# Patient Record
Sex: Female | Born: 1950 | Race: White | Hispanic: No | Smoking: Never smoker
Health system: Southern US, Community
[De-identification: ages and names within clinical notes are randomized; demographics above are authoritative.]

## PROBLEM LIST (undated history)

## (undated) DIAGNOSIS — I10 Essential (primary) hypertension: Secondary | ICD-10-CM

## (undated) DIAGNOSIS — E119 Type 2 diabetes mellitus without complications: Secondary | ICD-10-CM

## (undated) DIAGNOSIS — D219 Benign neoplasm of connective and other soft tissue, unspecified: Secondary | ICD-10-CM

## (undated) DIAGNOSIS — M1990 Unspecified osteoarthritis, unspecified site: Secondary | ICD-10-CM

## (undated) DIAGNOSIS — E785 Hyperlipidemia, unspecified: Secondary | ICD-10-CM

## (undated) DIAGNOSIS — M722 Plantar fascial fibromatosis: Secondary | ICD-10-CM

## (undated) DIAGNOSIS — H409 Unspecified glaucoma: Secondary | ICD-10-CM

## (undated) HISTORY — DX: Hyperlipidemia, unspecified: E78.5

## (undated) HISTORY — DX: Unspecified glaucoma: H40.9

## (undated) HISTORY — DX: Type 2 diabetes mellitus without complications: E11.9

## (undated) HISTORY — DX: Plantar fascial fibromatosis: M72.2

## (undated) HISTORY — DX: Unspecified osteoarthritis, unspecified site: M19.90

## (undated) HISTORY — DX: Essential (primary) hypertension: I10

## (undated) HISTORY — DX: Benign neoplasm of connective and other soft tissue, unspecified: D21.9

---
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
## 1983-12-29 HISTORY — PX: CHOLECYSTECTOMY OPEN: SUR202

## 1999-09-29 ENCOUNTER — Other Ambulatory Visit: Admission: RE | Admit: 1999-09-29 | Discharge: 1999-09-29 | Payer: Self-pay | Admitting: Gynecology

## 2001-04-18 ENCOUNTER — Encounter: Admission: RE | Admit: 2001-04-18 | Discharge: 2001-07-17 | Payer: Self-pay | Admitting: Family Medicine

## 2002-04-27 ENCOUNTER — Other Ambulatory Visit: Admission: RE | Admit: 2002-04-27 | Discharge: 2002-04-27 | Payer: Self-pay | Admitting: Gynecology

## 2003-04-18 ENCOUNTER — Other Ambulatory Visit: Admission: RE | Admit: 2003-04-18 | Discharge: 2003-04-18 | Payer: Self-pay | Admitting: Obstetrics and Gynecology

## 2004-12-28 LAB — HM COLONOSCOPY

## 2005-05-28 ENCOUNTER — Other Ambulatory Visit: Admission: RE | Admit: 2005-05-28 | Discharge: 2005-05-28 | Payer: Self-pay | Admitting: Obstetrics and Gynecology

## 2005-08-19 ENCOUNTER — Encounter (INDEPENDENT_AMBULATORY_CARE_PROVIDER_SITE_OTHER): Payer: Self-pay | Admitting: Specialist

## 2005-08-19 ENCOUNTER — Ambulatory Visit (HOSPITAL_COMMUNITY): Admission: RE | Admit: 2005-08-19 | Discharge: 2005-08-19 | Payer: Self-pay | Admitting: Gastroenterology

## 2007-12-29 DIAGNOSIS — M722 Plantar fascial fibromatosis: Secondary | ICD-10-CM

## 2007-12-29 HISTORY — DX: Plantar fascial fibromatosis: M72.2

## 2008-08-28 ENCOUNTER — Other Ambulatory Visit: Admission: RE | Admit: 2008-08-28 | Discharge: 2008-08-28 | Payer: Self-pay | Admitting: Obstetrics and Gynecology

## 2010-09-05 LAB — HM PAP SMEAR

## 2011-02-05 ENCOUNTER — Other Ambulatory Visit: Payer: Self-pay | Admitting: Ophthalmology

## 2011-05-15 NOTE — Op Note (Signed)
NAMETEEA, DUCEY               ACCOUNT NO.:  [id number]   MEDICAL RECORD NO.:  [id number]          PATIENT TYPE:  AMB   LOCATION:  ENDO                         FACILITY:  [facility]   PHYSICIAN:  Anselmo Rod, M.D.  DATE OF BIRTH:  02/13/1951   DATE OF PROCEDURE:  08/19/2005  DATE OF DISCHARGE:                                 OPERATIVE REPORT   PROCEDURE:  Esophagogastroduodenoscopy with biopsies.   ENDOSCOPIST:  Anselmo Rod, M.D.   INSTRUMENT USED:  Olympus video panendoscope.   INDICATIONS FOR PROCEDURE:  This 60 year old white female with a history of  epigastric pain, reflux and a family history of stomach cancer in her  mother, need to rule out peptic ulcer disease, esophagitis, gastritis, etc.   PRE-PROCEDURE PREPARATION:  An informed consent was procured from the  patient and the patient was fasted for eight hours prior to the procedure.   PRE-PROCEDURE PHYSICAL EXAMINATION:  VITAL SIGNS:  Stable.  NECK:  Supple.  CHEST:  Clear to auscultation.  HEART:  S1, S2, regular.  ABDOMEN:  Soft, normal bowel sounds.   DESCRIPTION OF PROCEDURE:  The patient was placed in the left lateral  decubitus position and sedated with 60 mg of Demerol and 8 mg of Versed in  slow incremental doses.  Once the patient was adequately sedated and  maintained on low-flow oxygen and continuous cardiac monitoring, the Olympus  video panendoscope was advanced through the mouth piece, over the tongue,  into the esophagus under direct vision.  The entire esophagus appeared  normal with no evidence of rings, strictures, masses, esophagitis or  Barrett's mucosa.  The scope was then advanced to the stomach and antral  gastritis was noted.  Biopsies were done to rule out the presence of H.  pylori by pathology.  The proximal small bowel appeared normal.   IMPRESSION:  Moderate antral gastritis, otherwise a normal  esophagogastroduodenoscopy.  No erosions, ulcerations, masses or polyps  seen.   RECOMMENDATIONS:  1.  Await pathology results.  2.  Treat with antibiotics if Helicobacter pylori present on biopsies.  3.  Proceed with a colonoscopy at this time.  Further recommendations will      be made thereafter.      Anselmo Rod, M.D.  Electronically Signed     JNM/MEDQ  D:  08/19/2005  T:  08/19/2005  Job:  [id number]   cc:   Ria Comment, F.N.P.   Cynthia P. Romine, M.D.  [address]  [place]  [place] [address]  Fax: [phone]   Duncan Dull, M.D.  [address]  [address]  [place]  [place] [address]  Fax: [phone]

## 2011-05-15 NOTE — Op Note (Signed)
NAMESAMIYA, Crystal Fox               ACCOUNT NO.:  [id number]   MEDICAL RECORD NO.:  [id number]          PATIENT TYPE:  AMB   LOCATION:  ENDO                         FACILITY:  [facility]   PHYSICIAN:  Anselmo Rod, M.D.  DATE OF BIRTH:  [birth year]   DATE OF PROCEDURE:  08/19/2005  DATE OF DISCHARGE:                                 OPERATIVE REPORT   PROCEDURE PERFORMED:  Colonoscopy with cold biopsies x8.   ENDOSCOPIST:  Anselmo Rod, M.D.   INSTRUMENT USED:  Olympus video colonoscope.   INDICATION FOR PROCEDURE:  A 60 year old white female undergoing screening  colonoscopy to rule out colonic polyps, masses, etc.   PREPROCEDURE PREPARATION:  Informed consent was procured from the patient.  The patient was fasted for eight hours prior to the procedure and prepped  with a bottle of magnesium citrate and a gallon of GoLYTELY the night prior  to the procedure.  The risks and benefits of the procedure, including a 10%  miss rate of cancer and polyps, were discussed with the patient as well.   PREPROCEDURE PHYSICAL:  VITAL SIGNS:  The patient had stable vital signs.  NECK:  Supple.  CHEST:  Clear to auscultation.  S1, S2 regular.  ABDOMEN:  Soft with normal bowel sounds.   DESCRIPTION OF PROCEDURE:  The patient was placed in the left lateral  decubitus position and sedated with an additional 40 mg of Demerol and 2 mg  of Versed in slow incremental doses.  Once the patient was adequately sedate  and maintained on low-flow oxygen and continuous cardiac monitoring, the  Olympus video colonoscope was advanced from the rectum to the cecum.  The  appendiceal orifice and the ileocecal valve were visualized and  photographed.  There was significant amount of residual stool in the right  colon.  Multiple washes were done.  A small patch of erythema was biopsied  from the proximal right colon.  The significance of this is unclear to me.  The rest of the exam was unremarkable.  Retroflexion in  the rectum revealed  no abnormalities.   IMPRESSION:  1.  Small patch of erythema biopsied from proximal right colon, results      pending.  2.  Otherwise normal exam.   RECOMMENDATIONS:  1.  Await pathology results.  2.  Repeat colonoscopy depending on pathology results.  3.  Have the patient follow up as the need arises in the future.  4.  Avoid all nonsteroidals for the next two weeks.      Anselmo Rod, M.D.  Electronically Signed     JNM/MEDQ  D:  08/19/2005  T:  08/19/2005  Job:  [id number]   cc:   Ria Comment, F.N.P.   Cynthia P. Romine, M.D.  [address]  [place]  [place] [address]  Fax: [phone]   Duncan Dull, M.D.  [address]  [address]  [place]  [place] [address]  Fax: [phone]

## 2013-06-14 LAB — HM MAMMOGRAPHY

## 2013-10-25 ENCOUNTER — Encounter: Payer: Self-pay | Admitting: Obstetrics and Gynecology

## 2013-10-25 ENCOUNTER — Ambulatory Visit: Payer: Self-pay | Admitting: Obstetrics and Gynecology

## 2013-10-25 ENCOUNTER — Ambulatory Visit (INDEPENDENT_AMBULATORY_CARE_PROVIDER_SITE_OTHER): Payer: 59 | Admitting: Obstetrics and Gynecology

## 2013-10-25 VITALS — BP 140/80 | HR 84 | Ht 64.0 in | Wt 220.5 lb

## 2013-10-25 DIAGNOSIS — Z0000 Encounter for general adult medical examination without abnormal findings: Secondary | ICD-10-CM

## 2013-10-25 DIAGNOSIS — Z01419 Encounter for gynecological examination (general) (routine) without abnormal findings: Secondary | ICD-10-CM

## 2013-10-25 LAB — POCT URINALYSIS DIPSTICK
Bilirubin, UA: NEGATIVE
Blood, UA: NEGATIVE
Glucose, UA: NEGATIVE
Ketones, UA: NEGATIVE
Leukocytes, UA: NEGATIVE
Nitrite, UA: NEGATIVE
Protein, UA: NEGATIVE
Urobilinogen, UA: NEGATIVE
pH, UA: 5

## 2013-10-25 MED ORDER — ESTRADIOL 10 MCG VA TABS: 1.0000 | ORAL_TABLET | VAGINAL | Status: DC

## 2013-10-25 NOTE — Addendum Note (Signed)
Addended by: Conley Simmonds on: 10/25/2013 12:18 PM   Modules accepted: Orders

## 2013-10-25 NOTE — Progress Notes (Signed)
Patient ID: Crystal Fox, female   DOB: 1951/11/29, 62 y.o.   MRN: [id number] GYNECOLOGY VISIT  PCP:  Geoffry Paradise, MD  Referring provider:   HPI: 62 y.o.   Married  Caucasian  female   G1P1001 with Patient's last menstrual period was 12/28/2004.   here for   AEX. Likes the Vagifem. Has a known cystocele.  Patient has not noticed any change.  No urinary incontinence.  Had surgery for prolapse in 1978 - Dr. Shea Evans.  Voids often and with urge.  Taking a new diabetes medication, which causes this.  Hgb A1C 7.9. Hoping to retire in three years.   Hgb:    PCP Urine:  Neg  GYNECOLOGIC HISTORY: Patient's last menstrual period was 12/28/2004. Sexually active:  yes Partner preference: female Contraception:  postmenopausal  Menopausal hormone therapy: Vagifem DES exposure:   no Blood transfusions:   no Sexually transmitted diseases:   no GYN Procedures:  Benign Left breast biopsy 1973 Mammogram:   [id number]  - mobile unit through work at [employer]. Pap:   09-05-10 wnl History of abnormal pap smear:  no   OB History   Grav Para Term Preterm Abortions TAB SAB Ect Mult Living   1 1 1       1        LIFESTYLE: Exercise:     no      Tobacco:     no Alcohol:       rarely Drug use:    no  OTHER HEALTH MAINTENANCE: Tetanus/TDap:  PCP Gardisil:  NA Influenza:    08/2013 Zostavax:    never  Bone density:  never Colonoscopy:  2006 wnl:Dr. Loreta Ave  Cholesterol check:  2014 wnl on medication with PCP  Family History  Problem Relation Age of Onset  . Diabetes Mother   . Hypertension Mother   . Stomach cancer Mother   . Heart attack Father     deceased from MI  . Diabetes Sister   . Diabetes Brother   . Hypertension Brother     There are no active problems to display for this patient.  Past Medical History  Diagnosis Date  . Diabetes mellitus without complication     AODM  . Hypertension   . Hyperlipidemia   . Osteoarthritis     -both knees, right  foot  . Plantar fasciitis 2009  . Glaucoma   . Fibroid     Past Surgical History  Procedure Laterality Date  . Breast surgery  1973    -benign Lt.breast bx  . Cholecystectomy open  1985    ALLERGIES: Sulfa antibiotics  Current Outpatient Prescriptions  Medication Sig Dispense Refill  . ACCU-CHEK AVIVA PLUS test strip 1 each by Other route every morning.      Marland Kitchen ACCU-CHEK FASTCLIX LANCETS MISC Place 306 Devices into alternate nostrils daily.      Marland Kitchen aspirin 81 MG tablet Take 81 mg by mouth daily.      Marland Kitchen atorvastatin (LIPITOR) 10 MG tablet Take 10 mg by mouth daily.      . Estradiol (VAGIFEM) 10 MCG TABS vaginal tablet Place 1 tablet vaginally 2 (two) times a week.      . insulin glargine (LANTUS) 100 UNIT/ML injection Inject 60 Units into the skin daily.      . INVOKANA 300 MG TABS Take 300 mg by mouth daily.      Marland Kitchen latanoprost (XALATAN) 0.005 % ophthalmic solution Place 7 drops into both eyes daily.      Marland Kitchen  losartan-hydrochlorothiazide (HYZAAR) 100-25 MG per tablet Take 1 tablet by mouth daily.      . metFORMIN (GLUCOPHAGE) 500 MG tablet Take 1,000 mg by mouth 2 (two) times daily with a meal.      . Multiple Vitamin (MULTIVITAMIN) capsule Take 1 capsule by mouth daily.       No current facility-administered medications for this visit.     ROS:  Pertinent items are noted in HPI.  SOCIAL HISTORY:  Engineer, manufacturing.  PHYSICAL EXAMINATION:    BP 140/80  Pulse 84  Ht 5\' 4"  (1.626 m)  Wt 220 lb 8 oz (100.018 kg)  BMI 37.83 kg/m2  LMP 12/28/2004   Wt Readings from Last 3 Encounters:  10/25/13 220 lb 8 oz (100.018 kg)     Ht Readings from Last 3 Encounters:  10/25/13 5\' 4"  (1.626 m)    General appearance: alert, cooperative and appears stated age Head: Normocephalic, without obvious abnormality, atraumatic Neck: no adenopathy, supple, symmetrical, trachea midline and thyroid not enlarged, symmetric, no tenderness/mass/nodules Lungs: clear to auscultation  bilaterally Breasts: Inspection negative, No nipple retraction or dimpling, No nipple discharge or bleeding, No axillary or supraclavicular adenopathy, Normal to palpation without dominant masses Heart: regular rate and rhythm Abdomen: RUQ oblique incision, obese abdomen, soft, non-tender; no masses,  no organomegaly Extremities: extremities normal, atraumatic, no cyanosis or edema Skin: Skin color, texture, turgor normal. No rashes or lesions Lymph nodes: Cervical, supraclavicular, and axillary nodes normal. No abnormal inguinal nodes palpated Neurologic: Grossly normal  Pelvic: External genitalia:  no lesions              Urethra:  normal appearing urethra with no masses, tenderness or lesions              Bartholins and Skenes: normal                 Vagina: normal appearing vagina with normal color and discharge, no lesions.  Second degree cystocele.  No significant rectocele.              Cervix: normal appearance              Pap and high risk HPV testing done: yes.            Bimanual Exam:  Uterus:  uterus is normal size, shape, consistency and nontender, first - second degree prolapse.                                      Adnexa: normal adnexa in size, nontender and no masses                                      Rectovaginal: Confirms                                      Anus:  normal sphincter tone, no lesions  ASSESSMENT   Vaginal atrophy. Incomplete uterovaginal prolapse. History of prior prolapse repair per patient.  Obesity. Diabetes mellitus.    PLAN  Mammogram yearly. Pap smear and high risk HPV testing Counseled on  Pelvic organ prolapse through discussion and writtten ACOG materials.  Vagifem refill for one year.  Medications per Epic orders Discussed weight loss and improved diabetes  control for improved surgical outcome if desires repair in future.  Return annually or prn   An After Visit Summary was printed and given to the patient.

## 2013-10-25 NOTE — Patient Instructions (Signed)

## 2013-10-27 LAB — IPS PAP TEST WITH HPV

## 2014-03-29 ENCOUNTER — Other Ambulatory Visit: Payer: Self-pay | Admitting: *Deleted

## 2014-03-29 MED ORDER — ESTRADIOL 10 MCG VA TABS: 1.0000 | ORAL_TABLET | VAGINAL | Status: DC

## 2014-03-29 NOTE — Telephone Encounter (Signed)
Last AEX 10/25/2013  Last refill 10/25/2013 #24/ 3 refills sent to [facility]. Next appt 10/2014  Will refill to [facility] as prescribed.

## 2014-08-28 ENCOUNTER — Encounter: Payer: Self-pay | Admitting: Obstetrics and Gynecology

## 2014-10-26 ENCOUNTER — Ambulatory Visit: Payer: 59 | Admitting: Obstetrics and Gynecology

## 2014-10-29 ENCOUNTER — Ambulatory Visit: Payer: 59 | Admitting: Obstetrics and Gynecology

## 2014-10-29 ENCOUNTER — Encounter: Payer: Self-pay | Admitting: Obstetrics and Gynecology

## 2014-11-02 ENCOUNTER — Ambulatory Visit: Payer: 59 | Admitting: Obstetrics and Gynecology

## 2014-12-11 ENCOUNTER — Ambulatory Visit: Payer: 59 | Admitting: Nurse Practitioner

## 2015-02-14 ENCOUNTER — Ambulatory Visit: Payer: 59 | Admitting: Nurse Practitioner

## 2015-02-14 ENCOUNTER — Telehealth: Payer: Self-pay | Admitting: Nurse Practitioner

## 2015-02-14 NOTE — Telephone Encounter (Signed)
Left messages at both numbers requesting patient call back to reschedule her AEX that was cancelled with Kem Boroughs, NP today due to provider illness.

## 2015-02-15 NOTE — Telephone Encounter (Signed)
Made in error please disregard.

## 2015-05-02 ENCOUNTER — Ambulatory Visit: Payer: Self-pay | Admitting: Nurse Practitioner

## 2015-05-02 ENCOUNTER — Telehealth: Payer: Self-pay | Admitting: Nurse Practitioner

## 2015-05-02 NOTE — Telephone Encounter (Signed)
Left message regarding upcoming appointment today 05/02/15 with Edman Circle has been canceled and needs to be rescheduled.

## 2015-07-05 ENCOUNTER — Ambulatory Visit: Payer: Self-pay | Admitting: Nurse Practitioner

## 2015-08-19 ENCOUNTER — Encounter: Payer: Self-pay | Admitting: Nurse Practitioner

## 2015-08-19 ENCOUNTER — Ambulatory Visit (INDEPENDENT_AMBULATORY_CARE_PROVIDER_SITE_OTHER): Payer: 59 | Admitting: Nurse Practitioner

## 2015-08-19 VITALS — BP 126/74 | HR 68 | Ht 64.0 in | Wt 184.0 lb

## 2015-08-19 DIAGNOSIS — Z01419 Encounter for gynecological examination (general) (routine) without abnormal findings: Secondary | ICD-10-CM

## 2015-08-19 DIAGNOSIS — E2839 Other primary ovarian failure: Secondary | ICD-10-CM | POA: Diagnosis not present

## 2015-08-19 MED ORDER — ESTRADIOL 10 MCG VA TABS: 1.0000 | ORAL_TABLET | VAGINAL | Status: DC

## 2015-08-19 NOTE — Progress Notes (Signed)
Patient ID: Crystal Fox, female   DOB: 07-Nov-1951, 64 y.o.   MRN: [id number] 64 y.o. G64P1001 Married  Caucasian Fe here for annual exam.  No further vaso symptoms. Has vaginal dryness and does have some help with Vagifem.  She is having problems with glucose control and in May insulin changed to [place] at 15 units at [facility], this has caused some weight loss of about 20 lbs.  Last HGB AIC was elevated but [place] expected to be better end of September.  Patient's last menstrual period was 12/28/2004 (approximate).          Sexually active: Yes.    The current method of family planning is post menopausal status.    Exercising: No.  The patient does not participate in regular exercise at present. Smoker:  no  Health Maintenance: Pap: 10/25/13, negative with neg HR HPV  MMG: 06/14/14, Bi-Rads 2: Benign - was done 05/2015 results are being sent Colonoscopy: 2006 PCP is arranging BMD:   Never  TDaP:  ? Labs:  Dr. Reynaldo Minium takes care of all labs and urine.  Will have next visit in Sept. 2016   reports that she has never smoked. She has never used smokeless tobacco. She reports that she drinks alcohol. She reports that she does not use illicit drugs.  Past Medical History  Diagnosis Date  . Diabetes mellitus without complication     AODM  . Hypertension   . Hyperlipidemia   . Osteoarthritis     -both knees, right foot  . Plantar fasciitis 2009  . Glaucoma   . Fibroid     Past Surgical History  Procedure Laterality Date  . Breast surgery  1973    -benign Lt.breast bx  . Cholecystectomy open  1985    Current Outpatient Prescriptions  Medication Sig Dispense Refill  . ACCU-CHEK AVIVA PLUS test strip 1 each by Other route every morning.    Marland Kitchen ACCU-CHEK FASTCLIX LANCETS MISC Place 306 Devices into alternate nostrils daily.    Marland Kitchen aspirin 81 MG tablet Take 81 mg by mouth daily.    Marland Kitchen atorvastatin (LIPITOR) 10 MG tablet Take 10 mg by mouth daily.    Marland Kitchen BYDUREON 2 MG PEN Inject into the skin once a  week.  6  . Estradiol (VAGIFEM) 10 MCG TABS vaginal tablet Place 1 tablet (10 mcg total) vaginally 2 (two) times a week. 24 tablet 4  . insulin glargine (LANTUS) 100 UNIT/ML injection Inject 15 Units into the skin daily.     . INVOKANA 300 MG TABS Take 300 mg by mouth daily.    Marland Kitchen latanoprost (XALATAN) 0.005 % ophthalmic solution Place 7 drops into both eyes daily.    Marland Kitchen losartan-hydrochlorothiazide (HYZAAR) 100-25 MG per tablet Take 1 tablet by mouth daily.    . metFORMIN (GLUCOPHAGE-XR) 500 MG 24 hr tablet Take 2 tablets by mouth 2 (two) times daily with a meal.    . Multiple Vitamin (MULTIVITAMIN) capsule Take 1 capsule by mouth daily.     No current facility-administered medications for this visit.    Family History  Problem Relation Age of Onset  . Diabetes Mother   . Hypertension Mother   . Stomach cancer Mother   . Heart attack Father     deceased from MI  . Diabetes Sister   . Diabetes Brother   . Hypertension Brother   . Cancer Paternal Grandmother     possible colon that went to liver  . Cancer Paternal Grandfather  oral cancer    ROS:  Pertinent items are noted in HPI.  Otherwise, a comprehensive ROS was negative.  Exam:   BP 126/74 mmHg  Pulse 68  Ht 5\' 4"  (1.626 m)  Wt 184 lb (83.462 kg)  BMI 31.57 kg/m2  LMP 12/28/2004 (Approximate) Height: 5\' 4"  (162.6 cm) Ht Readings from Last 3 Encounters:  08/19/15 5\' 4"  (1.626 m)  10/25/13 5\' 4"  (1.626 m)    General appearance: alert, cooperative and appears stated age Head: Normocephalic, without obvious abnormality, atraumatic Neck: no adenopathy, supple, symmetrical, trachea midline and thyroid normal to inspection and palpation Lungs: clear to auscultation bilaterally Breasts: normal appearance, no masses or tenderness Heart: regular rate and rhythm Abdomen: soft, non-tender; no masses,  no organomegaly Extremities: extremities normal, atraumatic, no cyanosis or edema Skin: Skin color, texture, turgor normal.  No rashes or lesions Lymph nodes: Cervical, supraclavicular, and axillary nodes normal. No abnormal inguinal nodes palpated Neurologic: Grossly normal   Pelvic: External genitalia:  no lesions              Urethra:  normal appearing urethra with no masses, tenderness or lesions              Bartholin's and Skene's: normal                 Vagina: atrophic appearing vagina with normal color and discharge, no lesions. But she does have a significant prolapse of the bladder and uterus.              Cervix: anteverted              Pap taken: No. Bimanual Exam:  Uterus:  normal size, contour, position, consistency, mobility, non-tender              Adnexa: no mass, fullness, tenderness               Rectovaginal: Confirms               Anus:  normal sphincter tone, no lesions  Chaperone present:  yes  A:  Well Woman with normal exam  Vaginal atrophy.  Incomplete uterovaginal prolapse.  History of prior prolapse repair after delivery of her son  Obesity but weight loss from 220 @ 10/14  To 184 currently, BMI 31.58  Diabetes mellitus.   P:   Reviewed health and wellness pertinent to exam  Pap smear as above  Mammogram is due 05/2016  Will get a BMD  Refill on Vagifem for a year  Counseled on risk of DVT, CVA, cancer, etc.   ROI for Mammo done at mobile unit - [facility].  Counseled on breast self exam, mammography screening, use and side effects of HRT, adequate intake of calcium and vitamin D, diet and exercise return annually or prn  An After Visit Summary was printed and given to the patient.

## 2015-08-19 NOTE — Progress Notes (Deleted)
Subjective:     Patient ID: Crystal Fox, female   DOB: 1951/01/03, 64 y.o.   MRN: [id number] 64 y.o. G56P1001 Married  Caucasian Fe here for annual exam.    Patient's last menstrual period was 12/28/2004.          Sexually active: {yes no:314532}  The current method of family planning is {contraception:315051}.    Exercising: {yes no:314532}  {types:19826} Smoker:  {YES P5382123  Health Maintenance: Pap:  10/25/13, negative with neg HR HPV MMG:  06/14/14, Bi-Rads 2:  Benign Colonoscopy:  2006?*** BMD:   *** TDaP:  *** Labs: ***   reports that she has never smoked. She does not have any smokeless tobacco history on file. She reports that she drinks alcohol. She reports that she does not use illicit drugs.  Past Medical History  Diagnosis Date  . Diabetes mellitus without complication     AODM  . Hypertension   . Hyperlipidemia   . Osteoarthritis     -both knees, right foot  . Plantar fasciitis 2009  . Glaucoma   . Fibroid     Past Surgical History  Procedure Laterality Date  . Breast surgery  1973    -benign Lt.breast bx  . Cholecystectomy open  1985    Current Outpatient Prescriptions  Medication Sig Dispense Refill  . ACCU-CHEK AVIVA PLUS test strip 1 each by Other route every morning.    Marland Kitchen ACCU-CHEK FASTCLIX LANCETS MISC Place 306 Devices into alternate nostrils daily.    Marland Kitchen aspirin 81 MG tablet Take 81 mg by mouth daily.    Marland Kitchen atorvastatin (LIPITOR) 10 MG tablet Take 10 mg by mouth daily.    . Estradiol (VAGIFEM) 10 MCG TABS vaginal tablet Place 1 tablet (10 mcg total) vaginally 2 (two) times a week. 24 tablet 2  . insulin glargine (LANTUS) 100 UNIT/ML injection Inject 60 Units into the skin daily.    . INVOKANA 300 MG TABS Take 300 mg by mouth daily.    Marland Kitchen latanoprost (XALATAN) 0.005 % ophthalmic solution Place 7 drops into both eyes daily.    Marland Kitchen losartan-hydrochlorothiazide (HYZAAR) 100-25 MG per tablet Take 1 tablet by mouth daily.    . metFORMIN (GLUCOPHAGE)  500 MG tablet Take 1,000 mg by mouth 2 (two) times daily with a meal.    . Multiple Vitamin (MULTIVITAMIN) capsule Take 1 capsule by mouth daily.     No current facility-administered medications for this visit.    Family History  Problem Relation Age of Onset  . Diabetes Mother   . Hypertension Mother   . Stomach cancer Mother   . Heart attack Father     deceased from MI  . Diabetes Sister   . Diabetes Brother   . Hypertension Brother     ROS:  Pertinent items are noted in HPI.  Otherwise, a comprehensive ROS was negative.  Exam:   LMP 12/28/2004   Ht Readings from Last 3 Encounters:  10/25/13 5\' 4"  (1.626 m)    General appearance: alert, cooperative and appears stated age Head: Normocephalic, without obvious abnormality, atraumatic Neck: no adenopathy, supple, symmetrical, trachea midline and thyroid {EXAM; THYROID:18604} Lungs: clear to auscultation bilaterally Breasts: {Exam; breast:13139::"normal appearance, no masses or tenderness"} Heart: regular rate and rhythm Abdomen: soft, non-tender; no masses,  no organomegaly Extremities: extremities normal, atraumatic, no cyanosis or edema Skin: Skin color, texture, turgor normal. No rashes or lesions Lymph nodes: Cervical, supraclavicular, and axillary nodes normal. No abnormal inguinal nodes palpated Neurologic: Grossly normal  Pelvic: External genitalia:  no lesions              Urethra:  normal appearing urethra with no masses, tenderness or lesions              Bartholin's and Skene's: normal                 Vagina: normal appearing vagina with normal color and discharge, no lesions              Cervix: {exam; cervix:14595}              Pap taken: {yes no:314532} Bimanual Exam:  Uterus:  {exam; uterus:12215}              Adnexa: {exam; adnexa:12223}               Rectovaginal: Confirms               Anus:  normal sphincter tone, no lesions  Chaperone present:  ***  A:  Well Woman with normal exam  P:    Reviewed health and wellness pertinent to exam  Pap smear as above  {plan; gyn:5269::"mammogram","pap smear","return annually or prn"}  An After Visit Summary was printed and given to the patient.    HPI   Review of Systems     Objective:   Physical Exam     Assessment:     ***    Plan:     ***

## 2015-08-19 NOTE — Patient Instructions (Addendum)

## 2015-08-20 ENCOUNTER — Encounter: Payer: Self-pay | Admitting: Nurse Practitioner

## 2015-08-22 NOTE — Progress Notes (Signed)
Encounter reviewed by Dr. Lenice Koper Amundson C. Silva.  

## 2016-08-19 ENCOUNTER — Ambulatory Visit (INDEPENDENT_AMBULATORY_CARE_PROVIDER_SITE_OTHER): Payer: 59 | Admitting: Nurse Practitioner

## 2016-08-19 ENCOUNTER — Encounter: Payer: Self-pay | Admitting: Nurse Practitioner

## 2016-08-19 VITALS — BP 128/66 | HR 76 | Ht 64.0 in | Wt 193.0 lb

## 2016-08-19 DIAGNOSIS — N952 Postmenopausal atrophic vaginitis: Secondary | ICD-10-CM | POA: Diagnosis not present

## 2016-08-19 DIAGNOSIS — Z1211 Encounter for screening for malignant neoplasm of colon: Secondary | ICD-10-CM | POA: Diagnosis not present

## 2016-08-19 DIAGNOSIS — Z0000 Encounter for general adult medical examination without abnormal findings: Secondary | ICD-10-CM

## 2016-08-19 DIAGNOSIS — Z01419 Encounter for gynecological examination (general) (routine) without abnormal findings: Secondary | ICD-10-CM | POA: Diagnosis not present

## 2016-08-19 DIAGNOSIS — N812 Incomplete uterovaginal prolapse: Secondary | ICD-10-CM

## 2016-08-19 LAB — HIV ANTIBODY (ROUTINE TESTING W REFLEX): HIV 1&2 Ab, 4th Generation: NONREACTIVE

## 2016-08-19 MED ORDER — ESTRADIOL 10 MCG VA TABS: 1.0000 | ORAL_TABLET | VAGINAL | 4 refills | Status: DC

## 2016-08-19 NOTE — Progress Notes (Signed)
Patient ID: Crystal Fox, female   DOB: [birth year], 65 y.o.   MRN: [id number]  65 y.o. G2P1001 Married  Caucasian Fe here for annual exam.  She is now going to retire 12/27/16 from [employer].  Left knee pain with cortisone injection 12/16.  Last HGB AIC 6.5 on 2/27.  She does continue to have stress incontinence.  Patient's last menstrual period was 12/28/2004 (approximate).          Sexually active: No.  The current method of family planning is post menopausal status.    Exercising: No.  The patient does not participate in regular exercise at present. Smoker:  no  Health Maintenance: Pap: 10/25/13, negative with neg HR HPV  MMG: 06/24/16, Bi-Rads 2: Benign Colonoscopy: 2006 BMD:  Never  TDaP:  ?, UTD with Dr. Reynaldo Minium Shingles: will discuss with Dr. Reynaldo Minium in 08/2016 Hep C and HIV: done  today Labs: Dr. Reynaldo Minium takes care of all labs   reports that she has never smoked. She has never used smokeless tobacco. She reports that she drinks alcohol. She reports that she does not use drugs.  Past Medical History:  Diagnosis Date  . Diabetes mellitus without complication    AODM  . Fibroid   . Glaucoma   . Hyperlipidemia   . Hypertension   . Osteoarthritis    -both knees, right foot  . Plantar fasciitis 2009    Past Surgical History:  Procedure Laterality Date  . BREAST SURGERY  1973   -benign Lt.breast bx  . CHOLECYSTECTOMY OPEN  1985    Current Outpatient Prescriptions  Medication Sig Dispense Refill  . ACCU-CHEK AVIVA PLUS test strip 1 each by Other route every morning.    Marland Kitchen ACCU-CHEK FASTCLIX LANCETS MISC Place 306 Devices into alternate nostrils daily.    Marland Kitchen aspirin 81 MG tablet Take 81 mg by mouth daily.    Marland Kitchen atorvastatin (LIPITOR) 10 MG tablet Take 10 mg by mouth daily.    Marland Kitchen BYDUREON 2 MG PEN Inject into the skin once a week.  6  . Estradiol (VAGIFEM) 10 MCG TABS vaginal tablet Place 1 tablet (10 mcg total) vaginally 2 (two) times a week. 24 tablet 4  . insulin glargine  (LANTUS) 100 UNIT/ML injection Inject 15 Units into the skin daily.     . INVOKANA 300 MG TABS Take 300 mg by mouth daily.    Marland Kitchen latanoprost (XALATAN) 0.005 % ophthalmic solution Place 7 drops into both eyes daily.    Marland Kitchen losartan-hydrochlorothiazide (HYZAAR) 100-25 MG per tablet Take 1 tablet by mouth daily.    . metFORMIN (GLUCOPHAGE-XR) 500 MG 24 hr tablet Take 2 tablets by mouth 2 (two) times daily with a meal.    . Multiple Vitamin (MULTIVITAMIN) capsule Take 1 capsule by mouth daily.     No current facility-administered medications for this visit.     Family History  Problem Relation Age of Onset  . Diabetes Mother   . Hypertension Mother   . Stomach cancer Mother   . Heart attack Father     deceased from MI  . Diabetes Sister   . Diabetes Brother   . Hypertension Brother   . Cancer Paternal Grandmother     possible colon that went to liver  . Cancer Paternal Grandfather     oral cancer    ROS:  Pertinent items are noted in HPI.  Otherwise, a comprehensive ROS was negative.  Exam:   LMP 12/28/2004 (Approximate)    Ht Readings  from Last 3 Encounters:  08/19/15 5\' 4"  (1.626 m)  10/25/13 5\' 4"  (1.626 m)    General appearance: alert, cooperative and appears stated age Head: Normocephalic, without obvious abnormality, atraumatic Neck: no adenopathy, supple, symmetrical, trachea midline and thyroid normal to inspection and palpation Lungs: clear to auscultation bilaterally Breasts: normal appearance, no masses or tenderness Heart: regular rate and rhythm Abdomen: soft, non-tender; no masses,  no organomegaly Extremities: extremities normal, atraumatic, no cyanosis or edema Skin: Skin color, texture, turgor normal. No rashes or lesions Lymph nodes: Cervical, supraclavicular, and axillary nodes normal. No abnormal inguinal nodes palpated Neurologic: Grossly normal   Pelvic: External genitalia:  no lesions              Urethra:  normal appearing urethra with no masses,  tenderness or lesions              Bartholin's and Skene's: normal                 Vagina: normal appearing vagina with normal color and discharge, no lesions.  She does have 2 nd degree cystocele.              Cervix: anteverted              Pap taken: Yes.   Bimanual Exam:  Uterus:  normal size, contour, position, consistency, mobility, non-tender with prolapse              Adnexa: no mass, fullness, tenderness               Rectovaginal: Confirms               Anus:  normal sphincter tone, no lesions  Chaperone present: yes  A:  Well Woman with normal exam  Vaginal atrophy.             Incomplete uterovaginal prolapse.             History of prior prolapse repair after delivery of her son             Obesity but weight loss from 220 @ 10/14  To 193 currently, BMI 33.13             Diabetes mellitus.   P:   Reviewed health and wellness pertinent to exam  Pap smear is done  Mammogram is due 05/2017  Refill Vagifem for a year  Counseled with risk of CVA, DVT, cancer, etc  Follow with labs  Will get a referral back to Dr. Collene Mares for colonoscopy  Discussed problems with prolapse and she is given literature to read -she will consider and return to see Dr. Quincy Simmonds.  Counseled on breast self exam, mammography screening, use and side effects of HRT, adequate intake of calcium and vitamin D, diet and exercise, Kegel's exercises return annually or prn  An After Visit Summary was printed and given to the patient.

## 2016-08-19 NOTE — Patient Instructions (Addendum)

## 2016-08-20 LAB — VITAMIN D 25 HYDROXY (VIT D DEFICIENCY, FRACTURES): Vit D, 25-Hydroxy: 30 ng/mL (ref 30–100)

## 2016-08-20 LAB — HEPATITIS C ANTIBODY: HCV Ab: NEGATIVE

## 2016-08-21 ENCOUNTER — Ambulatory Visit (INDEPENDENT_AMBULATORY_CARE_PROVIDER_SITE_OTHER): Payer: 59 | Admitting: Obstetrics and Gynecology

## 2016-08-21 ENCOUNTER — Encounter: Payer: Self-pay | Admitting: Obstetrics and Gynecology

## 2016-08-21 VITALS — BP 122/70 | HR 80 | Ht 64.0 in | Wt 193.0 lb

## 2016-08-21 DIAGNOSIS — N3946 Mixed incontinence: Secondary | ICD-10-CM | POA: Diagnosis not present

## 2016-08-21 DIAGNOSIS — N812 Incomplete uterovaginal prolapse: Secondary | ICD-10-CM | POA: Diagnosis not present

## 2016-08-21 NOTE — Patient Instructions (Signed)
Please call and let us know how you would like to move forward.  You have options!

## 2016-08-21 NOTE — Progress Notes (Signed)
Reviewed personally.  M. Suzanne Thelmer Legler, MD.  

## 2016-08-21 NOTE — Progress Notes (Signed)
GYNECOLOGY  VISIT   HPI: 65 y.o.   Married  Caucasian  female   G1P1001 with Patient's last menstrual period was 12/28/2004 (approximate).   here for evaluation of uterine prolapse and stress incontinence.     Seen by Edman Circle on 08/19/16 for annual exam.  Has known uterovaginal prolapse and stress incontinence.   Symptoms are pelvic pressure and slow voiding.   Leaks urine sometimes if coughs or laughs.  No leak for no reason at all.  Has urgency and then leakage related to this.  DF - every 3 hours. NF - rarely.  No enuresis.   No dysuria.  No hematuria.  No UTIs. Renal stone 15 years ago removed.  Had a stent.  BMs are regular and daily.  No splinting.  Some loose stools but not fecal incontinence.   Not having penetration intercourse due to prolapse and partner health issues.   Had surgery for prolapse in 1978 by Dr. Idolina Primer.  Used a pessary in the past.   Has DM. HgbA1C was 6.5 in Jan. or Feb 2017.   Considering surgery before the end of the year.  Will be retiring.  GYNECOLOGIC HISTORY: Patient's last menstrual period was 12/28/2004 (approximate). Contraception:  Postmenopausal Menopausal hormone therapy:  Vagifem Last mammogram:  06-24-16 fibroglandular disease/Neg/BiRads2:[facility] Last pap smear:   08-19-16 pending;10-25-13 Neg:Neg HR HPV        OB History    Gravida Para Term Preterm AB Living   1 1 1  0 0 1   SAB TAB Ectopic Multiple Live Births   0 0 0 0 1         There are no active problems to display for this patient.   Past Medical History:  Diagnosis Date  . Diabetes mellitus without complication ([place])    AODM  . Fibroid   . Glaucoma   . Hyperlipidemia   . Hypertension   . Osteoarthritis    -both knees, right foot  . Plantar fasciitis 2009    Past Surgical History:  Procedure Laterality Date  . BREAST SURGERY  1973   -benign Lt.breast bx  . CHOLECYSTECTOMY OPEN  1985    Current Outpatient Prescriptions  Medication Sig Dispense  Refill  . ACCU-CHEK AVIVA PLUS test strip 1 each by Other route every morning.    Marland Kitchen ACCU-CHEK FASTCLIX LANCETS MISC Place 306 Devices into alternate nostrils daily.    Marland Kitchen aspirin 81 MG tablet Take 81 mg by mouth daily.    Marland Kitchen atorvastatin (LIPITOR) 10 MG tablet Take 10 mg by mouth daily.    Marland Kitchen BYDUREON 2 MG PEN Inject into the skin once a week.  6  . Estradiol (VAGIFEM) 10 MCG TABS vaginal tablet Place 1 tablet (10 mcg total) vaginally 2 (two) times a week. 24 tablet 4  . insulin glargine (LANTUS) 100 UNIT/ML injection Inject 15 Units into the skin daily.     . INVOKANA 300 MG TABS Take 300 mg by mouth daily.    Marland Kitchen latanoprost (XALATAN) 0.005 % ophthalmic solution Place 7 drops into both eyes daily.    Marland Kitchen losartan-hydrochlorothiazide (HYZAAR) 100-25 MG per tablet Take 1 tablet by mouth daily.    . metFORMIN (GLUCOPHAGE-XR) 500 MG 24 hr tablet Take 2 tablets by mouth 2 (two) times daily with a meal.    . Multiple Vitamin (MULTIVITAMIN) capsule Take 1 capsule by mouth daily.     No current facility-administered medications for this visit.      ALLERGIES: Sulfa antibiotics  Family History  Problem Relation Age of Onset  . Diabetes Mother   . Hypertension Mother   . Stomach cancer Mother   . Heart attack Father     deceased from MI  . Diabetes Sister   . Diabetes Brother   . Hypertension Brother   . Cancer Paternal Grandmother     possible colon that went to liver  . Cancer Paternal Grandfather     oral cancer    Social History   Social History  . Marital status: Married    Spouse name: N/A  . Number of children: N/A  . Years of education: N/A   Occupational History  . Not on file.   Social History Main Topics  . Smoking status: Never Smoker  . Smokeless tobacco: Never Used  . Alcohol use Yes     Comment: rarely  . Drug use: No  . Sexual activity: Yes    Partners: Male    Birth control/ protection: Post-menopausal   Other Topics Concern  . Not on file   Social History  Narrative  . No narrative on file    ROS:  Pertinent items are noted in HPI.  PHYSICAL EXAMINATION:    BP 122/70 (BP Location: Right Arm, Patient Position: Sitting, Cuff Size: Large)   Pulse 80   Ht 5\' 4"  (1.626 m)   Wt 193 lb (87.5 kg)   LMP 12/28/2004 (Approximate)   BMI 33.13 kg/m     General appearance: alert, cooperative and appears stated age   Abdomen: RUQ scar, soft, non-tender, no masses,  no organomegaly    Pelvic: External genitalia:  no lesions              Urethra:  normal appearing urethra with no masses, tenderness or lesions              Bartholins and Skenes: normal                 Vagina: normal appearing vagina with normal color and discharge, no lesions.. Third degree cystocele.  First degree uterine prolapse. No significant rectocele.               Cervix: no lesions                Bimanual Exam:  Uterus:  normal size, contour, position, consistency, mobility, non-tender              Adnexa: no mass, fullness, tenderness              Rectal exam: Yes.  .  Confirms.              Anus:  normal sphincter tone, no lesions.  Patient examined in supine and standing position.   Chaperone was present for exam.  ASSESSMENT  Incomplete uterovaginal prolapse. Mixed incontinence.  DM.   PLAN  Discussion of pelvic organ prolapse and options for care - observation, pessary, and surgical reconstruction which would include a laparoscopically assisted vaginal hysterectomy, bilateral salpingo-oophorectomy, anterior and posterior colporrhaphy, and possible TVT Exact midureurthral sling and cystoscopy.  If decides to do surgical repair, I recommend multichannel urodynamic testing. Has ACOG handouts on prolapse and incontinence.  We discussed the importance of good blood sugar control to reduce surgically related infection and to promote healing.  An After Visit Summary was printed and given to the patient.  __25____ minutes face to face time of which over 50% was  spent in counseling.

## 2016-08-24 LAB — IPS PAP TEST WITH HPV

## 2017-08-20 ENCOUNTER — Ambulatory Visit: Payer: 59 | Admitting: Nurse Practitioner

## 2017-08-27 ENCOUNTER — Ambulatory Visit (INDEPENDENT_AMBULATORY_CARE_PROVIDER_SITE_OTHER): Payer: 59 | Admitting: Obstetrics and Gynecology

## 2017-08-27 ENCOUNTER — Encounter: Payer: Self-pay | Admitting: Obstetrics and Gynecology

## 2017-08-27 VITALS — BP 120/70 | HR 70 | Resp 16 | Ht 63.75 in | Wt 195.0 lb

## 2017-08-27 DIAGNOSIS — Z01419 Encounter for gynecological examination (general) (routine) without abnormal findings: Secondary | ICD-10-CM | POA: Diagnosis not present

## 2017-08-27 NOTE — Patient Instructions (Signed)

## 2017-08-27 NOTE — Progress Notes (Signed)
66 y.o. G59P1001 [name] here for annual exam.    A1C was 7 with last check.   Has known uterovaginal prolapse and mixed incontinence.  Some cramping with orgasm.  Some lower pelvic pressure. Not having frequent intercourse due to discomfort.  Hx prior prolapse surgery in 1978.  Needs Vagifem.  Retired and now is working out regularly.  Spending time with grandchildren.   PCP: Dr.  Burnard Bunting  Patient's last menstrual period was 12/28/2004 (approximate).           Sexually active: Yes.    The current method of family planning is post menopausal status & vasectomy    Exercising: Yes.    weights, balance training & walking Smoker:  no  Health Maintenance: Pap: 08/19/16 Pap and HR HPV negative  10/25/13, negative with neg HR HPV  History of abnormal Pap:  no MMG:  06-24-16 birads 2:neg Colonoscopy:  10/2016 neg f/u 20yrs BMD:   none TDaP:  UTD with pcp HIV and Hep C: 08/19/16 Negative Screening Labs:  PCP.   reports that she has never smoked. She has never used smokeless tobacco. She reports that she does not drink alcohol or use drugs.  Past Medical History:  Diagnosis Date  . Diabetes mellitus without complication ([place])    AODM  . Fibroid   . Glaucoma   . Hyperlipidemia   . Hypertension   . Osteoarthritis    -both knees, right foot  . Plantar fasciitis 2009    Past Surgical History:  Procedure Laterality Date  . BREAST SURGERY  1973   -benign Lt.breast bx  . CHOLECYSTECTOMY OPEN  1985    Current Outpatient Prescriptions  Medication Sig Dispense Refill  . ACCU-CHEK AVIVA PLUS test strip 1 each by Other route every morning.    Marland Kitchen aspirin 81 MG tablet Take 81 mg by mouth daily.    Marland Kitchen atorvastatin (LIPITOR) 10 MG tablet Take 10 mg by mouth daily.    . INVOKANA 300 MG TABS Take 300 mg by mouth daily.    Marland Kitchen latanoprost (XALATAN) 0.005 % ophthalmic solution Place 7 drops into both eyes daily.    Marland Kitchen losartan-hydrochlorothiazide (HYZAAR) 100-25 MG  per tablet Take 1 tablet by mouth daily.    . metFORMIN (GLUCOPHAGE-XR) 500 MG 24 hr tablet Take 2 tablets by mouth 2 (two) times daily with a meal.    . Multiple Vitamin (MULTIVITAMIN) capsule Take 1 capsule by mouth daily.    . TRESIBA FLEXTOUCH 200 UNIT/ML SOPN INJECT 26 UNITS ONCE ONCE DAILY  3  . TRULICITY 1.5 GX/2.1JH SOPN INJECT 1.5MG  ONCE A WEEK  3  . Estradiol (VAGIFEM) 10 MCG TABS vaginal tablet Place 1 tablet (10 mcg total) vaginally 2 (two) times a week. (Patient not taking: Reported on 08/27/2017) 24 tablet 4   No current facility-administered medications for this visit.     Family History  Problem Relation Age of Onset  . Diabetes Mother   . Hypertension Mother   . Stomach cancer Mother   . Heart attack Father        deceased from MI  . Diabetes Sister   . Diabetes Brother   . Hypertension Brother   . Cancer Paternal Grandmother        possible colon that went to liver  . Cancer Paternal Grandfather        oral cancer    ROS:  Pertinent items are noted in HPI.  Otherwise, a comprehensive ROS was negative.  Exam:  BP 120/70   Pulse 70   Resp 16   Ht 5' 3.75" (1.619 m)   Wt 195 lb (88.5 kg)   LMP 12/28/2004 (Approximate)   BMI 33.73 kg/m     General appearance: alert, cooperative and appears stated age Head: Normocephalic, without obvious abnormality, atraumatic Neck: no adenopathy, supple, symmetrical, trachea midline and thyroid normal to inspection and palpation Lungs: clear to auscultation bilaterally Breasts: normal appearance, no masses or tenderness, No nipple retraction or dimpling, No nipple discharge or bleeding, No axillary or supraclavicular adenopathy Heart: regular rate and rhythm Abdomen: soft, non-tender; no masses, no organomegaly Extremities: extremities normal, atraumatic, no cyanosis or edema Skin: Skin color, texture, turgor normal. No rashes or lesions Lymph nodes: Cervical, supraclavicular, and axillary nodes normal. No abnormal  inguinal nodes palpated Neurologic: Grossly normal  Pelvic: External genitalia:  no lesions              Urethra:  normal appearing urethra with no masses, tenderness or lesions              Bartholins and Skenes: normal                 Vagina: normal appearing vagina with normal color and discharge, no lesions.  Second degree cystocele, almost second degree uterine prolapse, mild rectocele.              Cervix: no lesions              Pap taken: No. Bimanual Exam:  Uterus:  normal size, contour, position, consistency, mobility, non-tender              Adnexa: no mass, fullness, tenderness              Rectal exam: Yes.  .  Confirms.              Anus:  normal sphincter tone, no lesions  Chaperone was present for exam.  Assessment:   Well woman visit with normal exam. Incomplete uterovaginal prolapse.  Mixed incontinence.  Vaginal atrophy.   Plan: Mammogram screening discussed.  She will call to schedule at [facility]. Recommended self breast awareness. Pap and HR HPV as above. Guidelines for Calcium, Vitamin D, regular exercise program including cardiovascular and weight bearing exercise. BMD through PCP or through [facility] [place]. Discussed pelvic organ prolapse - observation, pelvic PT, and surgery.  If patient decides to proceed with surgery, I would recommend hysterectomy with a sacrocolpopexy, anterior colporrhaphy, and possible midurethral sling.  intercourse will not worsen the prolapse or harm the vagina with prolapse present.  I encouraged good blood sugar control and weight loss.  Will refill Vagifem after mammogram back and normal. We talked about water based and oil based lubricants. Follow up annually and prn.   After visit summary provided.

## 2017-10-01 ENCOUNTER — Telehealth: Payer: Self-pay | Admitting: Obstetrics and Gynecology

## 2017-10-01 NOTE — Telephone Encounter (Signed)
Patient had mammogram done on last Wednesday and would like prescription for [facility]ifem [facility] [place] at [phone].

## 2017-10-01 NOTE — Telephone Encounter (Signed)
Call to [facility]. Results are still pending review. [facility] has requested prior imaging studies from [place] mobile unit but has not received records. Awaiting records for comparison to generate report. Left detailed message for patient. Advising of information provided by [facility]. Advised rx can not be refilled until we have received a report from [place] with her mammogram results. Advised may contact [facility] or [facility] mobile imaging with questions and to authorize records to be sent to [place].

## 2017-10-01 NOTE — Telephone Encounter (Signed)
Left message to call [place] at [phone].  Please request imaging location so that records can be obtained.

## 2017-10-01 NOTE — Telephone Encounter (Signed)
Left message to call Sharee Pimple at [phone].   Called to confirm location of last MMG, will need to request. No results available in EPIC or received via fax.

## 2017-10-01 NOTE — Telephone Encounter (Signed)
Patient is returning your call.  

## 2017-10-12 ENCOUNTER — Other Ambulatory Visit: Payer: Self-pay | Admitting: Obstetrics and Gynecology

## 2017-10-12 NOTE — Telephone Encounter (Signed)
Patient had her MMG done and received confirmation it was ok. She is requesting a refill for Vagifem to be [facility] to [facility] [place].

## 2017-10-13 MED ORDER — ESTRADIOL 10 MCG VA TABS: 1.0000 | ORAL_TABLET | VAGINAL | 3 refills | Status: DC

## 2017-10-13 NOTE — Telephone Encounter (Signed)
Medication refill request: Vagifem Last AEX:  08-27-17  Next AEX: 09-14-18 Last MMG (if hormonal medication request): 09-22-17 (needs additional imagining needed per [facility] -they are faxing over a copy) Refill authorized: Please

## 2017-10-19 ENCOUNTER — Encounter: Payer: Self-pay | Admitting: Obstetrics and Gynecology

## 2018-02-10 DIAGNOSIS — Z794 Long term (current) use of insulin: Secondary | ICD-10-CM | POA: Diagnosis not present

## 2018-02-10 DIAGNOSIS — I10 Essential (primary) hypertension: Secondary | ICD-10-CM | POA: Diagnosis not present

## 2018-02-10 DIAGNOSIS — E1165 Type 2 diabetes mellitus with hyperglycemia: Secondary | ICD-10-CM | POA: Diagnosis not present

## 2018-03-30 DIAGNOSIS — Z1389 Encounter for screening for other disorder: Secondary | ICD-10-CM | POA: Diagnosis not present

## 2018-03-30 DIAGNOSIS — E669 Obesity, unspecified: Secondary | ICD-10-CM | POA: Diagnosis not present

## 2018-03-30 DIAGNOSIS — E7849 Other hyperlipidemia: Secondary | ICD-10-CM | POA: Diagnosis not present

## 2018-03-30 DIAGNOSIS — E1165 Type 2 diabetes mellitus with hyperglycemia: Secondary | ICD-10-CM | POA: Diagnosis not present

## 2018-03-30 DIAGNOSIS — I10 Essential (primary) hypertension: Secondary | ICD-10-CM | POA: Diagnosis not present

## 2018-03-30 DIAGNOSIS — Z794 Long term (current) use of insulin: Secondary | ICD-10-CM | POA: Diagnosis not present

## 2018-04-27 DIAGNOSIS — E113293 Type 2 diabetes mellitus with mild nonproliferative diabetic retinopathy without macular edema, bilateral: Secondary | ICD-10-CM | POA: Diagnosis not present

## 2018-04-27 DIAGNOSIS — H401131 Primary open-angle glaucoma, bilateral, mild stage: Secondary | ICD-10-CM | POA: Diagnosis not present

## 2018-04-27 DIAGNOSIS — H2513 Age-related nuclear cataract, bilateral: Secondary | ICD-10-CM | POA: Diagnosis not present

## 2018-04-27 DIAGNOSIS — H25013 Cortical age-related cataract, bilateral: Secondary | ICD-10-CM | POA: Diagnosis not present

## 2018-05-11 DIAGNOSIS — Z794 Long term (current) use of insulin: Secondary | ICD-10-CM | POA: Diagnosis not present

## 2018-05-11 DIAGNOSIS — I10 Essential (primary) hypertension: Secondary | ICD-10-CM | POA: Diagnosis not present

## 2018-05-11 DIAGNOSIS — E119 Type 2 diabetes mellitus without complications: Secondary | ICD-10-CM | POA: Diagnosis not present

## 2018-05-11 DIAGNOSIS — E669 Obesity, unspecified: Secondary | ICD-10-CM | POA: Diagnosis not present

## 2018-08-04 DIAGNOSIS — Z794 Long term (current) use of insulin: Secondary | ICD-10-CM | POA: Diagnosis not present

## 2018-08-04 DIAGNOSIS — I10 Essential (primary) hypertension: Secondary | ICD-10-CM | POA: Diagnosis not present

## 2018-08-04 DIAGNOSIS — E113293 Type 2 diabetes mellitus with mild nonproliferative diabetic retinopathy without macular edema, bilateral: Secondary | ICD-10-CM | POA: Diagnosis not present

## 2018-08-04 DIAGNOSIS — E7849 Other hyperlipidemia: Secondary | ICD-10-CM | POA: Diagnosis not present

## 2018-08-04 DIAGNOSIS — M21612 Bunion of left foot: Secondary | ICD-10-CM | POA: Diagnosis not present

## 2018-08-04 DIAGNOSIS — E1165 Type 2 diabetes mellitus with hyperglycemia: Secondary | ICD-10-CM | POA: Diagnosis not present

## 2018-08-04 DIAGNOSIS — G629 Polyneuropathy, unspecified: Secondary | ICD-10-CM | POA: Diagnosis not present

## 2018-09-14 ENCOUNTER — Other Ambulatory Visit (HOSPITAL_COMMUNITY)
Admission: RE | Admit: 2018-09-14 | Discharge: 2018-09-14 | Disposition: A | Discharge status: Home / Self Care | Payer: PPO | Source: Ambulatory Visit | Attending: Obstetrics and Gynecology | Admitting: Obstetrics and Gynecology

## 2018-09-14 ENCOUNTER — Other Ambulatory Visit: Payer: Self-pay

## 2018-09-14 ENCOUNTER — Ambulatory Visit (INDEPENDENT_AMBULATORY_CARE_PROVIDER_SITE_OTHER): Payer: PPO | Admitting: Obstetrics and Gynecology

## 2018-09-14 ENCOUNTER — Encounter: Payer: Self-pay | Admitting: Obstetrics and Gynecology

## 2018-09-14 VITALS — BP 116/60 | HR 83 | Ht 63.25 in | Wt 193.0 lb

## 2018-09-14 DIAGNOSIS — Z01419 Encounter for gynecological examination (general) (routine) without abnormal findings: Secondary | ICD-10-CM | POA: Diagnosis not present

## 2018-09-14 MED ORDER — ESTRADIOL 10 MCG VA TABS: 1.0000 | ORAL_TABLET | VAGINAL | 3 refills | Status: DC

## 2018-09-14 NOTE — Progress Notes (Signed)
67 y.o. G51P1001 [name] here for annual exam.    Has known prolapse with second degree cystocele, almost second degree uterine prolapse, mild rectocele. Also has hx mixed incontinence.  States she is doing well with this.  Emptying bladder well.  Using Vagifem and has pain with intercourse.  Using lubricant with intercourse.  A1C is now 6.2.  Enjoying retirement for 2 years.  Labs with PCP.   PCP:  Dr Reynaldo Minium  Patient's last menstrual period was 12/28/2004 (approximate).     Period Cycle (Days): (post menopausal)     Sexually active: Yes.    The current method of family planning is post menopausal status.    Exercising: Yes.    walking, training 2 days a week Smoker:  no  Health Maintenance: Pap:  08/19/2016 normal History of abnormal Pap:  no MMG:  09/22/2017, BI-RADs2.  Has an appointment on 09/26/18 at [facility].  Colonoscopy:  10/2016 normal BMD:   n/a  Result  n/a TDaP:  2016 Gardasil:   no HIV: done negative Hep C: done negative Screening Labs:   PCP.   reports that she has never smoked. She has never used smokeless tobacco. She reports that she does not drink alcohol or use drugs.  Past Medical History:  Diagnosis Date  . Diabetes mellitus without complication ([place])    AODM  . Fibroid   . Glaucoma   . Hyperlipidemia   . Hypertension   . Osteoarthritis    -both knees, right foot  . Plantar fasciitis 2009    Past Surgical History:  Procedure Laterality Date  . BREAST SURGERY  1973   -benign Lt.breast bx  . CHOLECYSTECTOMY OPEN  1985    Current Outpatient Medications  Medication Sig Dispense Refill  . ACCU-CHEK AVIVA PLUS test strip 1 each by Other route every morning.    Marland Kitchen aspirin 81 MG tablet Take 81 mg by mouth daily.    Marland Kitchen atorvastatin (LIPITOR) 10 MG tablet Take 10 mg by mouth daily.    . Estradiol (VAGIFEM) 10 MCG TABS vaginal tablet Place 1 tablet (10 mcg total) vaginally 2 (two) times a week. 24 tablet 3  . INVOKANA 300 MG TABS  Take 300 mg by mouth daily.    Marland Kitchen latanoprost (XALATAN) 0.005 % ophthalmic solution Place 7 drops into both eyes daily.    Marland Kitchen losartan-hydrochlorothiazide (HYZAAR) 100-25 MG per tablet Take 1 tablet by mouth daily.    . metFORMIN (GLUCOPHAGE-XR) 500 MG 24 hr tablet Take 2 tablets by mouth 2 (two) times daily with a meal.    . Multiple Vitamin (MULTIVITAMIN) capsule Take 1 capsule by mouth daily.    . TRESIBA FLEXTOUCH 200 UNIT/ML SOPN INJECT 26 UNITS ONCE ONCE DAILY  3  . TRULICITY 1.5 IR/5.1OA SOPN INJECT 1.5MG  ONCE A WEEK  3  . BD PEN NEEDLE NANO U/F 32G X 4 MM MISC      No current facility-administered medications for this visit.     Family History  Problem Relation Age of Onset  . Diabetes Mother   . Hypertension Mother   . Stomach cancer Mother   . Heart attack Father        deceased from MI  . Diabetes Sister   . Diabetes Brother   . Hypertension Brother   . Cancer Paternal Grandmother        possible colon that went to liver  . Cancer Paternal Grandfather        oral cancer  Review of Systems  Constitutional: Negative.   HENT: Negative.   Eyes: Negative.   Respiratory: Negative.   Cardiovascular: Negative.   Gastrointestinal: Negative.   Endocrine: Negative.   Genitourinary:       Pain with intercourse  Musculoskeletal: Negative.   Skin: Negative.   Allergic/Immunologic: Negative.   Neurological: Negative.   Hematological: Negative.   Psychiatric/Behavioral: Negative.   All other systems reviewed and are negative.   Exam:   BP 116/60   Pulse 83   Ht 5' 3.25" (1.607 m)   Wt 193 lb (87.5 kg)   LMP 12/28/2004 (Approximate)   BMI 33.92 kg/m     General appearance: alert, cooperative and appears stated age Head: Normocephalic, without obvious abnormality, atraumatic Neck: no adenopathy, supple, symmetrical, trachea midline and thyroid normal to inspection and palpation Lungs: clear to auscultation bilaterally Breasts: normal appearance, no masses or  tenderness, No nipple retraction or dimpling, No nipple discharge or bleeding, No axillary or supraclavicular adenopathy Heart: regular rate and rhythm Abdomen: soft, non-tender; no masses, no organomegaly Extremities: extremities normal, atraumatic, no cyanosis or edema Skin: Skin color, texture, turgor normal. No rashes or lesions Lymph nodes: Cervical, supraclavicular, and axillary nodes normal. No abnormal inguinal nodes palpated Neurologic: Grossly normal  Pelvic: External genitalia:  no lesions              Urethra:  normal appearing urethra with no masses, tenderness or lesions              Bartholins and Skenes: normal                 Vagina: normal appearing vagina with normal color and discharge, no lesions.  Second degree cystocele, almost second degree uterine prolapse, first degree rectocele.               Cervix: no lesions              Pap taken: Yes.   Bimanual Exam:  Uterus:  normal size, contour, position, consistency, mobility, non-tender              Adnexa: no mass, fullness, tenderness              Rectal exam: Yes.  .  Confirms.              Anus:  normal sphincter tone, no lesions  Chaperone was present for exam.  Assessment:   Well woman visit with normal exam. Incomplete uterovaginal prolapse. Stable.  No urinary incontinence currently. Vaginal atrophy.  Diabetes.  Well controlled.  Plan: Mammogram screening. Recommended self breast awareness. Pap and HR HPV as above. Guidelines for Calcium, Vitamin D, regular exercise program including cardiovascular and weight bearing exercise. Will do refill of Vagifem.  Discussed potential effect on breast cancer. She declines switching to vaginal estrogen cream.  She will try cooking oils.  Labs and potential BMD with PCP.  Follow up annually and prn.   After visit summary provided.

## 2018-09-14 NOTE — Patient Instructions (Signed)

## 2018-09-16 LAB — CYTOLOGY - PAP: Diagnosis: NEGATIVE

## 2018-09-26 DIAGNOSIS — Z1231 Encounter for screening mammogram for malignant neoplasm of breast: Secondary | ICD-10-CM | POA: Diagnosis not present

## 2018-10-11 ENCOUNTER — Encounter: Payer: Self-pay | Admitting: Obstetrics and Gynecology

## 2018-10-12 DIAGNOSIS — Z6830 Body mass index (BMI) 30.0-30.9, adult: Secondary | ICD-10-CM | POA: Diagnosis not present

## 2018-10-12 DIAGNOSIS — E113293 Type 2 diabetes mellitus with mild nonproliferative diabetic retinopathy without macular edema, bilateral: Secondary | ICD-10-CM | POA: Diagnosis not present

## 2018-10-12 DIAGNOSIS — Z794 Long term (current) use of insulin: Secondary | ICD-10-CM | POA: Diagnosis not present

## 2018-10-12 DIAGNOSIS — I10 Essential (primary) hypertension: Secondary | ICD-10-CM | POA: Diagnosis not present

## 2018-10-21 DIAGNOSIS — Z23 Encounter for immunization: Secondary | ICD-10-CM | POA: Diagnosis not present

## 2018-10-26 DIAGNOSIS — H401112 Primary open-angle glaucoma, right eye, moderate stage: Secondary | ICD-10-CM | POA: Diagnosis not present

## 2018-10-26 DIAGNOSIS — H401122 Primary open-angle glaucoma, left eye, moderate stage: Secondary | ICD-10-CM | POA: Diagnosis not present

## 2018-12-07 DIAGNOSIS — E113293 Type 2 diabetes mellitus with mild nonproliferative diabetic retinopathy without macular edema, bilateral: Secondary | ICD-10-CM | POA: Diagnosis not present

## 2018-12-07 DIAGNOSIS — E7849 Other hyperlipidemia: Secondary | ICD-10-CM | POA: Diagnosis not present

## 2018-12-07 DIAGNOSIS — I10 Essential (primary) hypertension: Secondary | ICD-10-CM | POA: Diagnosis not present

## 2018-12-12 DIAGNOSIS — Z1212 Encounter for screening for malignant neoplasm of rectum: Secondary | ICD-10-CM | POA: Diagnosis not present

## 2018-12-14 DIAGNOSIS — Z23 Encounter for immunization: Secondary | ICD-10-CM | POA: Diagnosis not present

## 2018-12-14 DIAGNOSIS — E113293 Type 2 diabetes mellitus with mild nonproliferative diabetic retinopathy without macular edema, bilateral: Secondary | ICD-10-CM | POA: Diagnosis not present

## 2018-12-14 DIAGNOSIS — E669 Obesity, unspecified: Secondary | ICD-10-CM | POA: Diagnosis not present

## 2018-12-14 DIAGNOSIS — Z794 Long term (current) use of insulin: Secondary | ICD-10-CM | POA: Diagnosis not present

## 2018-12-14 DIAGNOSIS — E7849 Other hyperlipidemia: Secondary | ICD-10-CM | POA: Diagnosis not present

## 2018-12-14 DIAGNOSIS — I10 Essential (primary) hypertension: Secondary | ICD-10-CM | POA: Diagnosis not present

## 2018-12-14 DIAGNOSIS — E1169 Type 2 diabetes mellitus with other specified complication: Secondary | ICD-10-CM | POA: Diagnosis not present

## 2018-12-14 DIAGNOSIS — Z0000 Encounter for general adult medical examination without abnormal findings: Secondary | ICD-10-CM | POA: Diagnosis not present

## 2018-12-14 DIAGNOSIS — G629 Polyneuropathy, unspecified: Secondary | ICD-10-CM | POA: Diagnosis not present

## 2019-01-11 DIAGNOSIS — I10 Essential (primary) hypertension: Secondary | ICD-10-CM | POA: Diagnosis not present

## 2019-01-11 DIAGNOSIS — Z6831 Body mass index (BMI) 31.0-31.9, adult: Secondary | ICD-10-CM | POA: Diagnosis not present

## 2019-01-11 DIAGNOSIS — E1169 Type 2 diabetes mellitus with other specified complication: Secondary | ICD-10-CM | POA: Diagnosis not present

## 2019-01-11 DIAGNOSIS — Z794 Long term (current) use of insulin: Secondary | ICD-10-CM | POA: Diagnosis not present

## 2019-01-11 DIAGNOSIS — H401131 Primary open-angle glaucoma, bilateral, mild stage: Secondary | ICD-10-CM | POA: Diagnosis not present

## 2019-01-11 DIAGNOSIS — M21612 Bunion of left foot: Secondary | ICD-10-CM | POA: Diagnosis not present

## 2019-04-12 DIAGNOSIS — I10 Essential (primary) hypertension: Secondary | ICD-10-CM | POA: Diagnosis not present

## 2019-04-12 DIAGNOSIS — Z794 Long term (current) use of insulin: Secondary | ICD-10-CM | POA: Diagnosis not present

## 2019-04-12 DIAGNOSIS — E1169 Type 2 diabetes mellitus with other specified complication: Secondary | ICD-10-CM | POA: Diagnosis not present

## 2019-06-28 DIAGNOSIS — E669 Obesity, unspecified: Secondary | ICD-10-CM | POA: Diagnosis not present

## 2019-06-28 DIAGNOSIS — E1169 Type 2 diabetes mellitus with other specified complication: Secondary | ICD-10-CM | POA: Diagnosis not present

## 2019-06-28 DIAGNOSIS — G629 Polyneuropathy, unspecified: Secondary | ICD-10-CM | POA: Diagnosis not present

## 2019-06-28 DIAGNOSIS — E785 Hyperlipidemia, unspecified: Secondary | ICD-10-CM | POA: Diagnosis not present

## 2019-06-28 DIAGNOSIS — M21612 Bunion of left foot: Secondary | ICD-10-CM | POA: Diagnosis not present

## 2019-06-28 DIAGNOSIS — Z794 Long term (current) use of insulin: Secondary | ICD-10-CM | POA: Diagnosis not present

## 2019-06-28 DIAGNOSIS — Z1331 Encounter for screening for depression: Secondary | ICD-10-CM | POA: Diagnosis not present

## 2019-06-28 DIAGNOSIS — E113293 Type 2 diabetes mellitus with mild nonproliferative diabetic retinopathy without macular edema, bilateral: Secondary | ICD-10-CM | POA: Diagnosis not present

## 2019-06-28 DIAGNOSIS — I10 Essential (primary) hypertension: Secondary | ICD-10-CM | POA: Diagnosis not present

## 2019-07-12 DIAGNOSIS — H524 Presbyopia: Secondary | ICD-10-CM | POA: Diagnosis not present

## 2019-07-12 DIAGNOSIS — H2513 Age-related nuclear cataract, bilateral: Secondary | ICD-10-CM | POA: Diagnosis not present

## 2019-07-12 DIAGNOSIS — E113293 Type 2 diabetes mellitus with mild nonproliferative diabetic retinopathy without macular edema, bilateral: Secondary | ICD-10-CM | POA: Diagnosis not present

## 2019-07-12 DIAGNOSIS — H401131 Primary open-angle glaucoma, bilateral, mild stage: Secondary | ICD-10-CM | POA: Diagnosis not present

## 2019-07-19 DIAGNOSIS — Z794 Long term (current) use of insulin: Secondary | ICD-10-CM | POA: Diagnosis not present

## 2019-07-19 DIAGNOSIS — E1169 Type 2 diabetes mellitus with other specified complication: Secondary | ICD-10-CM | POA: Diagnosis not present

## 2019-07-19 DIAGNOSIS — I10 Essential (primary) hypertension: Secondary | ICD-10-CM | POA: Diagnosis not present

## 2019-09-18 ENCOUNTER — Other Ambulatory Visit: Payer: Self-pay

## 2019-09-20 ENCOUNTER — Ambulatory Visit (INDEPENDENT_AMBULATORY_CARE_PROVIDER_SITE_OTHER): Payer: PPO | Admitting: Obstetrics and Gynecology

## 2019-09-20 ENCOUNTER — Encounter: Payer: Self-pay | Admitting: Obstetrics and Gynecology

## 2019-09-20 ENCOUNTER — Other Ambulatory Visit: Payer: Self-pay

## 2019-09-20 VITALS — BP 132/68 | HR 84 | Temp 97.3°F | Resp 12 | Ht 63.5 in | Wt 183.4 lb

## 2019-09-20 DIAGNOSIS — Z01419 Encounter for gynecological examination (general) (routine) without abnormal findings: Secondary | ICD-10-CM | POA: Diagnosis not present

## 2019-09-20 MED ORDER — ESTRADIOL 10 MCG VA TABS: 1.0000 | ORAL_TABLET | VAGINAL | 3 refills | Status: DC

## 2019-09-20 NOTE — Progress Notes (Signed)
68 y.o. G60P1001 [name] here for annual exam.    Not currently having intercourse.  Thinks her prolapse is stable.  Using Vagifem twice weekly, and she wants to continue this. No bladder or bowel control problems.  Emptying bladder well.   No vaginal bleeding or discharge.  Taking over the counter Ca with vit D once daily.   Will see her PCP in October.   PCP: Burnard Bunting, MD    Patient's last menstrual period was 12/28/2004 (approximate).           Sexually active: Yes.    The current method of family planning is post menopausal status.    Exercising: Yes.    workouts at gym Smoker:  no  Health Maintenance: Pap:  09/14/18 Normal History of abnormal Pap:  no MMG:  09/26/18 BIRADS 1 negative/density b.  Appt Oct. 5 at [facility].  Colonoscopy:  10/2016 normal BMD:   never  Result  n/a TDaP:  2016 Gardasil:   no HIV and Hep C: negative in the past Screening Labs: PCP   reports that she has never smoked. She has never used smokeless tobacco. She reports that she does not drink alcohol or use drugs.  Past Medical History:  Diagnosis Date  . Diabetes mellitus without complication ([place])    AODM  . Fibroid   . Glaucoma   . Hyperlipidemia   . Hypertension   . Osteoarthritis    -both knees, right foot  . Plantar fasciitis 2009    Past Surgical History:  Procedure Laterality Date  . BREAST SURGERY  1973   -benign Lt.breast bx  . CHOLECYSTECTOMY OPEN  1985    Current Outpatient Medications  Medication Sig Dispense Refill  . ACCU-CHEK AVIVA PLUS test strip 1 each by Other route every morning.    Marland Kitchen aspirin 81 MG tablet Take 81 mg by mouth daily.    Marland Kitchen atorvastatin (LIPITOR) 10 MG tablet Take 10 mg by mouth daily.    . BD PEN NEEDLE NANO U/F 32G X 4 MM MISC     . [START ON 09/21/2019] Estradiol (VAGIFEM) 10 MCG TABS vaginal tablet Place 1 tablet (10 mcg total) vaginally 2 (two) times a week. 24 tablet 3  . INVOKANA 300 MG TABS Take 300 mg by mouth daily.     Marland Kitchen latanoprost (XALATAN) 0.005 % ophthalmic solution Place 7 drops into both eyes daily.    Marland Kitchen losartan-hydrochlorothiazide (HYZAAR) 100-25 MG per tablet Take 1 tablet by mouth daily.    . metFORMIN (GLUCOPHAGE-XR) 500 MG 24 hr tablet Take 2 tablets by mouth 2 (two) times daily with a meal.    . Multiple Vitamin (MULTIVITAMIN) capsule Take 1 capsule by mouth daily.    . TRESIBA FLEXTOUCH 200 UNIT/ML SOPN INJECT 26 UNITS ONCE ONCE DAILY  3  . TRULICITY 1.5 0000000 SOPN INJECT 1.5MG  ONCE A WEEK  3   No current facility-administered medications for this visit.     Family History  Problem Relation Age of Onset  . Diabetes Mother   . Hypertension Mother   . Stomach cancer Mother   . Heart attack Father        deceased from MI  . Diabetes Sister   . Diabetes Brother   . Hypertension Brother   . Cancer Paternal Grandmother        possible colon that went to liver  . Cancer Paternal Grandfather        oral cancer    Review of Systems  Constitutional: Negative.   HENT: Negative.   Eyes: Negative.   Respiratory: Negative.   Cardiovascular: Negative.   Gastrointestinal: Negative.   Endocrine: Negative.   Genitourinary: Negative.   Musculoskeletal: Negative.   Skin: Negative.   Allergic/Immunologic: Negative.   Neurological: Negative.   Hematological: Negative.   Psychiatric/Behavioral: Negative.     Exam:   BP 132/68 (BP Location: Right Arm, Patient Position: Sitting, Cuff Size: Normal)   Pulse 84   Temp (!) 97.3 F (36.3 C) (Temporal)   Resp 12   Ht 5' 3.5" (1.613 m)   Wt 183 lb 6.4 oz (83.2 kg)   LMP 12/28/2004 (Approximate)   BMI 31.98 kg/m     General appearance: alert, cooperative and appears stated age Head: normocephalic, without obvious abnormality, atraumatic Neck: no adenopathy, supple, symmetrical, trachea midline and thyroid normal to inspection and palpation Lungs: clear to auscultation bilaterally Breasts: normal appearance, no masses or tenderness, No  nipple retraction or dimpling, No nipple discharge or bleeding, No axillary adenopathy Heart: regular rate and rhythm Abdomen: soft, non-tender; no masses, no organomegaly Extremities: extremities normal, atraumatic, no cyanosis or edema Skin: skin color, texture, turgor normal. No rashes or lesions Lymph nodes: cervical, supraclavicular, and axillary nodes normal. Neurologic: grossly normal  Pelvic: External genitalia:  no lesions              No abnormal inguinal nodes palpated.              Urethra:  normal appearing urethra with no masses, tenderness or lesions              Bartholins and Skenes: normal                 Vagina: normal appearing vagina with normal color and discharge, no lesions.  Second degree cystocele, almost second degree uterine prolapse, less than first degree rectocele.               Cervix: no lesions              Pap taken: No. Bimanual Exam:  Uterus:  normal size, contour, position, consistency, mobility, non-tender              Adnexa: no mass, fullness, tenderness              Rectal exam: Yes.  .  Confirms.              Anus:  normal sphincter tone, no lesions  Chaperone was present for exam.  Assessment:   Well woman visit with normal exam. Has known prolapse with second degree cystocele, almost second degree uterine prolapse, mild rectocele.  Stable exam today.  Vaginal atrophy.  DM.   Plan: Mammogram screening discussed. Self breast awareness reviewed. Pap and HR HPV as above. Guidelines for Calcium, Vitamin D, regular exercise program including cardiovascular and weight bearing exercise. We discussed her prolapse and risk factors for progression.  We discussed observational management, pelvic floor PT, pessary and surgery.  She prefers observational management.  Refill of Vagifem for one year.  I discussed potential effect on breast cancer. Flu vaccine recommended.  She will discussed BMD with her PCP.  I told her she has the option to do this  at [facility] also if she chooses. Follow up annually and prn.   After visit summary provided.

## 2019-09-20 NOTE — Patient Instructions (Signed)

## 2019-10-09 ENCOUNTER — Encounter: Payer: Self-pay | Admitting: Obstetrics and Gynecology

## 2019-10-09 DIAGNOSIS — Z1231 Encounter for screening mammogram for malignant neoplasm of breast: Secondary | ICD-10-CM | POA: Diagnosis not present

## 2019-10-13 DIAGNOSIS — Z23 Encounter for immunization: Secondary | ICD-10-CM | POA: Diagnosis not present

## 2019-10-25 DIAGNOSIS — E1169 Type 2 diabetes mellitus with other specified complication: Secondary | ICD-10-CM | POA: Diagnosis not present

## 2019-10-25 DIAGNOSIS — Z794 Long term (current) use of insulin: Secondary | ICD-10-CM | POA: Diagnosis not present

## 2019-10-25 DIAGNOSIS — I10 Essential (primary) hypertension: Secondary | ICD-10-CM | POA: Diagnosis not present

## 2019-11-01 DIAGNOSIS — G629 Polyneuropathy, unspecified: Secondary | ICD-10-CM | POA: Diagnosis not present

## 2019-11-01 DIAGNOSIS — E785 Hyperlipidemia, unspecified: Secondary | ICD-10-CM | POA: Diagnosis not present

## 2019-11-01 DIAGNOSIS — E1169 Type 2 diabetes mellitus with other specified complication: Secondary | ICD-10-CM | POA: Diagnosis not present

## 2019-11-01 DIAGNOSIS — I10 Essential (primary) hypertension: Secondary | ICD-10-CM | POA: Diagnosis not present

## 2019-11-15 DIAGNOSIS — M1712 Unilateral primary osteoarthritis, left knee: Secondary | ICD-10-CM | POA: Diagnosis not present

## 2019-11-29 DIAGNOSIS — M1712 Unilateral primary osteoarthritis, left knee: Secondary | ICD-10-CM | POA: Diagnosis not present

## 2019-12-20 DIAGNOSIS — M1712 Unilateral primary osteoarthritis, left knee: Secondary | ICD-10-CM | POA: Diagnosis not present

## 2019-12-27 DIAGNOSIS — M1712 Unilateral primary osteoarthritis, left knee: Secondary | ICD-10-CM | POA: Diagnosis not present

## 2020-01-03 DIAGNOSIS — M1712 Unilateral primary osteoarthritis, left knee: Secondary | ICD-10-CM | POA: Diagnosis not present

## 2020-01-10 DIAGNOSIS — M1712 Unilateral primary osteoarthritis, left knee: Secondary | ICD-10-CM | POA: Diagnosis not present

## 2020-01-10 DIAGNOSIS — H401131 Primary open-angle glaucoma, bilateral, mild stage: Secondary | ICD-10-CM | POA: Diagnosis not present

## 2020-01-17 DIAGNOSIS — M1712 Unilateral primary osteoarthritis, left knee: Secondary | ICD-10-CM | POA: Diagnosis not present

## 2020-01-24 DIAGNOSIS — E1169 Type 2 diabetes mellitus with other specified complication: Secondary | ICD-10-CM | POA: Diagnosis not present

## 2020-01-24 DIAGNOSIS — E7849 Other hyperlipidemia: Secondary | ICD-10-CM | POA: Diagnosis not present

## 2020-01-29 DIAGNOSIS — R82998 Other abnormal findings in urine: Secondary | ICD-10-CM | POA: Diagnosis not present

## 2020-01-29 DIAGNOSIS — I10 Essential (primary) hypertension: Secondary | ICD-10-CM | POA: Diagnosis not present

## 2020-01-31 DIAGNOSIS — Z0000 Encounter for general adult medical examination without abnormal findings: Secondary | ICD-10-CM | POA: Diagnosis not present

## 2020-01-31 DIAGNOSIS — E785 Hyperlipidemia, unspecified: Secondary | ICD-10-CM | POA: Diagnosis not present

## 2020-01-31 DIAGNOSIS — E669 Obesity, unspecified: Secondary | ICD-10-CM | POA: Diagnosis not present

## 2020-01-31 DIAGNOSIS — G629 Polyneuropathy, unspecified: Secondary | ICD-10-CM | POA: Diagnosis not present

## 2020-01-31 DIAGNOSIS — E1169 Type 2 diabetes mellitus with other specified complication: Secondary | ICD-10-CM | POA: Diagnosis not present

## 2020-01-31 DIAGNOSIS — M1990 Unspecified osteoarthritis, unspecified site: Secondary | ICD-10-CM | POA: Diagnosis not present

## 2020-01-31 DIAGNOSIS — Z794 Long term (current) use of insulin: Secondary | ICD-10-CM | POA: Diagnosis not present

## 2020-01-31 DIAGNOSIS — I10 Essential (primary) hypertension: Secondary | ICD-10-CM | POA: Diagnosis not present

## 2020-02-07 DIAGNOSIS — M21612 Bunion of left foot: Secondary | ICD-10-CM | POA: Diagnosis not present

## 2020-02-07 DIAGNOSIS — E1169 Type 2 diabetes mellitus with other specified complication: Secondary | ICD-10-CM | POA: Diagnosis not present

## 2020-02-07 DIAGNOSIS — Z794 Long term (current) use of insulin: Secondary | ICD-10-CM | POA: Diagnosis not present

## 2020-02-07 DIAGNOSIS — I10 Essential (primary) hypertension: Secondary | ICD-10-CM | POA: Diagnosis not present

## 2020-05-15 DIAGNOSIS — E1169 Type 2 diabetes mellitus with other specified complication: Secondary | ICD-10-CM | POA: Diagnosis not present

## 2020-05-15 DIAGNOSIS — Z794 Long term (current) use of insulin: Secondary | ICD-10-CM | POA: Diagnosis not present

## 2020-05-15 DIAGNOSIS — I10 Essential (primary) hypertension: Secondary | ICD-10-CM | POA: Diagnosis not present

## 2020-06-26 DIAGNOSIS — Z794 Long term (current) use of insulin: Secondary | ICD-10-CM | POA: Diagnosis not present

## 2020-06-26 DIAGNOSIS — E113293 Type 2 diabetes mellitus with mild nonproliferative diabetic retinopathy without macular edema, bilateral: Secondary | ICD-10-CM | POA: Diagnosis not present

## 2020-06-26 DIAGNOSIS — E1169 Type 2 diabetes mellitus with other specified complication: Secondary | ICD-10-CM | POA: Diagnosis not present

## 2020-06-26 DIAGNOSIS — E669 Obesity, unspecified: Secondary | ICD-10-CM | POA: Diagnosis not present

## 2020-06-26 DIAGNOSIS — I10 Essential (primary) hypertension: Secondary | ICD-10-CM | POA: Diagnosis not present

## 2020-06-26 DIAGNOSIS — G629 Polyneuropathy, unspecified: Secondary | ICD-10-CM | POA: Diagnosis not present

## 2020-06-26 DIAGNOSIS — M1990 Unspecified osteoarthritis, unspecified site: Secondary | ICD-10-CM | POA: Diagnosis not present

## 2020-07-10 DIAGNOSIS — H524 Presbyopia: Secondary | ICD-10-CM | POA: Diagnosis not present

## 2020-07-10 DIAGNOSIS — E113293 Type 2 diabetes mellitus with mild nonproliferative diabetic retinopathy without macular edema, bilateral: Secondary | ICD-10-CM | POA: Diagnosis not present

## 2020-07-10 DIAGNOSIS — H401131 Primary open-angle glaucoma, bilateral, mild stage: Secondary | ICD-10-CM | POA: Diagnosis not present

## 2020-07-10 DIAGNOSIS — H2513 Age-related nuclear cataract, bilateral: Secondary | ICD-10-CM | POA: Diagnosis not present

## 2020-08-14 DIAGNOSIS — Z794 Long term (current) use of insulin: Secondary | ICD-10-CM | POA: Diagnosis not present

## 2020-08-14 DIAGNOSIS — I10 Essential (primary) hypertension: Secondary | ICD-10-CM | POA: Diagnosis not present

## 2020-08-14 DIAGNOSIS — E113299 Type 2 diabetes mellitus with mild nonproliferative diabetic retinopathy without macular edema, unspecified eye: Secondary | ICD-10-CM | POA: Diagnosis not present

## 2020-09-04 ENCOUNTER — Other Ambulatory Visit: Payer: Self-pay | Admitting: Obstetrics and Gynecology

## 2020-09-04 DIAGNOSIS — Z01419 Encounter for gynecological examination (general) (routine) without abnormal findings: Secondary | ICD-10-CM

## 2020-09-04 NOTE — Telephone Encounter (Signed)
Medication refill request: Yuvafem 10mcg  Last AEX:  09/20/19 Next AEX: 11/13/20 Last MMG (if hormonal medication request): 10/09/19  Benign  Refill authorized: 24/3

## 2020-10-02 DIAGNOSIS — Z794 Long term (current) use of insulin: Secondary | ICD-10-CM | POA: Diagnosis not present

## 2020-10-02 DIAGNOSIS — E663 Overweight: Secondary | ICD-10-CM | POA: Diagnosis not present

## 2020-10-02 DIAGNOSIS — E1169 Type 2 diabetes mellitus with other specified complication: Secondary | ICD-10-CM | POA: Diagnosis not present

## 2020-10-02 DIAGNOSIS — I10 Essential (primary) hypertension: Secondary | ICD-10-CM | POA: Diagnosis not present

## 2020-10-14 DIAGNOSIS — Z1231 Encounter for screening mammogram for malignant neoplasm of breast: Secondary | ICD-10-CM | POA: Diagnosis not present

## 2020-11-12 NOTE — Progress Notes (Signed)
69 y.o. G64P1001 [name] here for annual exam.    Hoping her pelvic organ prolapse is stable.  She would like to avoid surgery if possible.  No problems with bladder or bowel control. No noted prolapse change per patient.   Denies vaginal bleeding.   Using vaginal estradiol tablets, and this is working well for her.   Loosing weight.  Her A1C was 6.2.  Completed Covid vaccine.  No booster.  Did her flu vaccine.   PCP: Burnard Bunting, MD  Patient's last menstrual period was 12/28/2004 (approximate).           Sexually active: Yes.    The current method of family planning is post menopausal status.    Exercising: Yes.    Does weights and core training with personal trainer at gym.  Leg presses of 150 pounds. Smoker:  no  Health Maintenance: Pap: 09-14-18 Neg, 08-19-16 Neg:Neg HR HPV, 10-25-13 Neg:Neg HR HPV History of abnormal Pap:  no MMG: 10-14-20 3D/Neg/density B/Birads1--[facility] Colonoscopy: 10/2016 normal;next 10 years BMD: n/a  Result  n/a TDaP:  2016 Gardasil:   no HIV: 08-19-16 NR Hep C: 08-19-16 Neg Screening Labs:  PCP.    reports that she has never smoked. She has never used smokeless tobacco. She reports that she does not drink alcohol and does not use drugs.  Past Medical History:  Diagnosis Date  . 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 Fibroid   . Glaucoma   . Hyperlipidemia   . Hypertension   . Osteoarthritis    -both knees, right foot  . Plantar fasciitis 2009    Past Surgical History:  Procedure Laterality Date  . BREAST SURGERY  1973   -benign Lt.breast bx  . CHOLECYSTECTOMY OPEN  1985    Current Outpatient Medications  Medication Sig Dispense Refill  . ACCU-CHEK AVIVA PLUS test strip 1 each by Other route every morning.    Marland Kitchen aspirin 81 MG tablet Take 81 mg by mouth daily.    Marland Kitchen atorvastatin (LIPITOR) 10 MG tablet Take 10 mg by mouth daily.    . BD PEN NEEDLE NANO U/F 32G X 4 MM MISC     .  hydrochlorothiazide (HYDRODIURIL) 25 MG tablet Take 25 mg by mouth daily.    . INVOKANA 300 MG TABS Take 300 mg by mouth daily.    Marland Kitchen latanoprost (XALATAN) 0.005 % ophthalmic solution Place 7 drops into both eyes daily.    Marland Kitchen losartan (COZAAR) 100 MG tablet Take 100 mg by mouth daily.    . metFORMIN (GLUCOPHAGE-XR) 500 MG 24 hr tablet Take 2 tablets by mouth 2 (two) times daily with a meal.    . Multiple Vitamin (MULTIVITAMIN) capsule Take 1 capsule by mouth daily.    . TRESIBA FLEXTOUCH 200 UNIT/ML SOPN INJECT 26 UNITS ONCE ONCE DAILY  3  . TRULICITY 1.5 KN/3.9JQ SOPN INJECT 1.5MG  ONCE A WEEK  3  . YUVAFEM 10 MCG TABS vaginal tablet PLACE 1 TABLET (10 MCG TOTAL) VAGINALLY 2 (TWO) TIMES A WEEK. 24 tablet 0   No current facility-administered medications for this visit.    Family History  Problem Relation Age of Onset  . Diabetes Mother   . Hypertension Mother   . Stomach cancer Mother   . Heart attack Father        deceased from MI  . Diabetes Sister   . Diabetes Brother   . Hypertension Brother   . Cancer Paternal Grandmother  possible colon that went to liver  . Cancer Paternal Grandfather        oral cancer    Review of Systems  All other systems reviewed and are negative.   Exam:   BP 114/64 (Cuff Size: Large)   Pulse 84   Ht 5' 3.5" (1.613 m)   Wt 182 lb (82.6 kg)   LMP 12/28/2004 (Approximate)   SpO2 99%   BMI 31.73 kg/m     General appearance: alert, cooperative and appears stated age Head: normocephalic, without obvious abnormality, atraumatic Neck: no adenopathy, supple, symmetrical, trachea midline and thyroid normal to inspection and palpation Lungs: clear to auscultation bilaterally Breasts: normal appearance, no masses or tenderness, No nipple retraction or dimpling, No nipple discharge or bleeding, No axillary adenopathy Heart: regular rate and rhythm Abdomen: soft, non-tender; no masses, no organomegaly Extremities: extremities normal, atraumatic, no  cyanosis or edema Skin: skin color, texture, turgor normal. No rashes or lesions Lymph nodes: cervical, supraclavicular, and axillary nodes normal. Neurologic: grossly normal  Pelvic: External genitalia:  no lesions              No abnormal inguinal nodes palpated.              Urethra:  normal appearing urethra with no masses, tenderness or lesions              Bartholins and Skenes: normal                 Vagina: normal appearing vagina with normal color and discharge, no lesions.  Second degree cystocele, almost second degree uterine  Prolapse, minimal rectocele.                Cervix: no lesions              Pap taken: Yes.   Bimanual Exam:  Uterus:  normal size, contour, position, consistency, mobility, non-tender              Adnexa: no mass, fullness, tenderness              Rectal exam: Yes.  .  Confirms.              Anus:  normal sphincter tone, no lesions  Chaperone was present for exam.  Assessment:   Well woman visit with normal exam. Has known prolapse with second degree cystocele, almost second degree uterine prolapse, mild rectocele.  Stable exam today.  Vaginal atrophy.  DM.   Plan: Mammogram screening discussed. Self breast awareness reviewed. Pap and HR HPV as above. Guidelines for Calcium, Vitamin D, regular exercise program including cardiovascular and weight bearing exercise. She will pursue her BMD at her PCP office.  Refill of Vagifem.  I discussed potential effect on breast cancer.  She will do observation of her prolapse.  I discussed heavy weight lifting, chronic straining, and coughing as risk factors for prolapse progression.  Follow up annually and prn.

## 2020-11-13 ENCOUNTER — Encounter: Payer: Self-pay | Admitting: Obstetrics and Gynecology

## 2020-11-13 ENCOUNTER — Ambulatory Visit (INDEPENDENT_AMBULATORY_CARE_PROVIDER_SITE_OTHER): Payer: PPO | Admitting: Obstetrics and Gynecology

## 2020-11-13 ENCOUNTER — Other Ambulatory Visit (HOSPITAL_COMMUNITY)
Admission: RE | Admit: 2020-11-13 | Discharge: 2020-11-13 | Disposition: A | Discharge status: Home / Self Care | Payer: PPO | Source: Ambulatory Visit | Attending: Obstetrics and Gynecology | Admitting: Obstetrics and Gynecology

## 2020-11-13 ENCOUNTER — Other Ambulatory Visit: Payer: Self-pay

## 2020-11-13 DIAGNOSIS — Z01419 Encounter for gynecological examination (general) (routine) without abnormal findings: Secondary | ICD-10-CM | POA: Insufficient documentation

## 2020-11-13 MED ORDER — ESTRADIOL 10 MCG VA TABS: ORAL_TABLET | VAGINAL | 3 refills | Status: DC

## 2020-11-13 NOTE — Patient Instructions (Signed)

## 2020-11-15 LAB — CYTOLOGY - PAP
Diagnosis: NEGATIVE
Diagnosis: REACTIVE

## 2020-11-20 DIAGNOSIS — I10 Essential (primary) hypertension: Secondary | ICD-10-CM | POA: Diagnosis not present

## 2020-11-20 DIAGNOSIS — Z794 Long term (current) use of insulin: Secondary | ICD-10-CM | POA: Diagnosis not present

## 2020-11-20 DIAGNOSIS — E1169 Type 2 diabetes mellitus with other specified complication: Secondary | ICD-10-CM | POA: Diagnosis not present

## 2020-12-16 ENCOUNTER — Encounter: Payer: Self-pay | Admitting: Obstetrics and Gynecology

## 2021-01-22 DIAGNOSIS — H401131 Primary open-angle glaucoma, bilateral, mild stage: Secondary | ICD-10-CM | POA: Diagnosis not present

## 2021-02-05 DIAGNOSIS — I10 Essential (primary) hypertension: Secondary | ICD-10-CM | POA: Diagnosis not present

## 2021-02-05 DIAGNOSIS — E113299 Type 2 diabetes mellitus with mild nonproliferative diabetic retinopathy without macular edema, unspecified eye: Secondary | ICD-10-CM | POA: Diagnosis not present

## 2021-02-05 DIAGNOSIS — Z794 Long term (current) use of insulin: Secondary | ICD-10-CM | POA: Diagnosis not present

## 2021-02-05 DIAGNOSIS — R002 Palpitations: Secondary | ICD-10-CM | POA: Diagnosis not present

## 2021-02-05 DIAGNOSIS — E785 Hyperlipidemia, unspecified: Secondary | ICD-10-CM | POA: Diagnosis not present

## 2021-02-05 DIAGNOSIS — E663 Overweight: Secondary | ICD-10-CM | POA: Diagnosis not present

## 2021-02-26 DIAGNOSIS — R002 Palpitations: Secondary | ICD-10-CM | POA: Diagnosis not present

## 2021-02-26 DIAGNOSIS — I10 Essential (primary) hypertension: Secondary | ICD-10-CM | POA: Diagnosis not present

## 2021-02-26 DIAGNOSIS — E1169 Type 2 diabetes mellitus with other specified complication: Secondary | ICD-10-CM | POA: Diagnosis not present

## 2021-02-26 DIAGNOSIS — Z794 Long term (current) use of insulin: Secondary | ICD-10-CM | POA: Diagnosis not present

## 2021-02-26 DIAGNOSIS — E785 Hyperlipidemia, unspecified: Secondary | ICD-10-CM | POA: Diagnosis not present

## 2021-03-18 NOTE — Progress Notes (Signed)
Referring-Richard Reynaldo Minium, MD Reason for referral-palpitations  HPI: 70 year old female for evaluation of palpitations at request of Burnard Bunting, MD. laboratories February 2022 showed potassium 4.9 and TSH 1.26.  Patient states that over the past several months she has had intermittent palpitations.  These are described as a brief flutter and not sustained.  No associated dizziness or syncope.  She otherwise denies dyspnea, chest pain or pedal edema.  Cardiology now asked to evaluate.  Current Outpatient Medications  Medication Sig Dispense Refill  . ACCU-CHEK AVIVA PLUS test strip 1 each by Other route every morning.    . Ascorbic Acid (VITAMIN C PO) Take 1 tablet by mouth daily.    Marland Kitchen aspirin 81 MG tablet Take 81 mg by mouth daily.    Marland Kitchen atorvastatin (LIPITOR) 10 MG tablet Take 10 mg by mouth daily.    . BD PEN NEEDLE NANO U/F 32G X 4 MM MISC     . Coenzyme Q10 (CO Q 10 PO) Take 1 tablet by mouth daily.    . Estradiol (YUVAFEM) 10 MCG TABS vaginal tablet PLACE 1 TABLET (10 MCG TOTAL) VAGINALLY 2 (TWO) TIMES A WEEK. 24 tablet 3  . hydrochlorothiazide (HYDRODIURIL) 25 MG tablet Take 25 mg by mouth daily.    . INVOKANA 300 MG TABS Take 300 mg by mouth daily.    Marland Kitchen latanoprost (XALATAN) 0.005 % ophthalmic solution Place 1 drop into both eyes daily.    Marland Kitchen losartan (COZAAR) 100 MG tablet Take 100 mg by mouth daily.    . metFORMIN (GLUCOPHAGE-XR) 500 MG 24 hr tablet Take 2 tablets by mouth 2 (two) times daily with a meal.    . Multiple Vitamin (MULTIVITAMIN) capsule Take 1 capsule by mouth daily.    . TRESIBA FLEXTOUCH 200 UNIT/ML SOPN INJECT 26 UNITS ONCE ONCE DAILY  3  . TRULICITY 1.5 OZ/3.6UY SOPN INJECT 1.5MG  ONCE A WEEK  3  . TURMERIC-GINGER PO Take 1 tablet by mouth daily.     No current facility-administered medications for this visit.    Allergies  Allergen Reactions  . Sulfa Antibiotics Rash     Past Medical History:  Diagnosis Date  . Diabetes mellitus without  complication ([place])    AODM  . Fibroid   . Glaucoma   . Hyperlipidemia   . Hypertension   . Osteoarthritis    -both knees, right foot  . Plantar fasciitis 2009    Past Surgical History:  Procedure Laterality Date  . bladder tack    . BREAST SURGERY  1973   -benign Lt.breast bx  . CHOLECYSTECTOMY OPEN  1985    Social History   Socioeconomic History  . Marital status: Married    Spouse name: Not on file  . Number of children: 5  . Years of education: Not on file  . Highest education level: Not on file  Occupational History  . Not on file  Tobacco Use  . Smoking status: Never Smoker  . Smokeless tobacco: Never Used  Vaping Use  . Vaping Use: Never used  Substance and Sexual Activity  . Alcohol use: No  . Drug use: No  . Sexual activity: Yes    Partners: Male    Birth control/protection: Post-menopausal, Other-see comments    Comment: husband vasectomy  Other Topics Concern  . Not on file  Social History Narrative  . Not on file   Social Determinants of Health   Financial Resource Strain: Not on file  Food Insecurity: Not on file  Transportation Needs: Not on file  Physical Activity: Not on file  Stress: Not on file  Social Connections: Not on file  Intimate Partner Violence: Not on file    Family History  Problem Relation Age of Onset  . Diabetes Mother   . Hypertension Mother   . Stomach cancer Mother   . Heart attack Father        deceased from MI  . Diabetes Sister   . Diabetes Brother   . Hypertension Brother   . Cancer Paternal Grandmother        possible colon that went to liver  . Cancer Paternal Grandfather        oral cancer    ROS: no fevers or chills, productive cough, hemoptysis, dysphasia, odynophagia, melena, hematochezia, dysuria, hematuria, rash, seizure activity, orthopnea, PND, pedal edema, claudication. Remaining systems are negative.  Physical Exam:   Blood pressure 124/62, pulse 85, height 5' 3.5" (1.613 m), weight 183 lb  (83 kg), last menstrual period 12/28/2004.  General:  Well developed/well nourished in NAD Skin warm/dry Patient not depressed No peripheral clubbing Back-normal HEENT-normal/normal eyelids Neck supple/normal carotid upstroke bilaterally; no bruits; no JVD; no thyromegaly chest - CTA/ normal expansion CV - RRR/normal S1 and S2; no murmurs, rubs or gallops;  PMI nondisplaced Abdomen -NT/ND, no HSM, no mass, + bowel sounds, no bruit 2+ femoral pulses, no bruits Ext-no edema, chords, 2+ DP Neuro-grossly nonfocal  ECG -normal sinus rhythm, cannot rule out septal infarct.  Personally reviewed  A/P  1 palpitations-patient was having symptoms as I was auscultating.  Likely PVCs.  We will add Toprol 25 mg daily.  Schedule echocardiogram to assess LV function.  We also discussed the possibility of recording strips with an android watch.  2 hypertension-patient's blood pressure is controlled.  Continue present medications and follow.  3 hyperlipidemia-continue statin.  4 diabetes mellitus-she has multiple risk factors.  We will arrange a calcium score for risk stratification.  If elevated we will advance her Lipitor.  Kirk Ruths, MD

## 2021-03-24 ENCOUNTER — Ambulatory Visit: Payer: PPO | Admitting: Cardiology

## 2021-03-24 ENCOUNTER — Encounter: Payer: Self-pay | Admitting: Cardiology

## 2021-03-24 ENCOUNTER — Other Ambulatory Visit: Payer: Self-pay

## 2021-03-24 VITALS — BP 124/62 | HR 85 | Ht 63.5 in | Wt 183.0 lb

## 2021-03-24 DIAGNOSIS — R002 Palpitations: Secondary | ICD-10-CM

## 2021-03-24 DIAGNOSIS — E7800 Pure hypercholesterolemia, unspecified: Secondary | ICD-10-CM | POA: Diagnosis not present

## 2021-03-24 DIAGNOSIS — I10 Essential (primary) hypertension: Secondary | ICD-10-CM | POA: Diagnosis not present

## 2021-03-24 MED ORDER — METOPROLOL SUCCINATE ER 25 MG PO TB24: 25.0000 mg | ORAL_TABLET | Freq: Every day | ORAL | 3 refills | Status: DC

## 2021-03-24 NOTE — Patient Instructions (Signed)
  Testing/Procedures:  Your physician has requested that you have an echocardiogram. Echocardiography is a painless test that uses sound waves to create images of your heart. It provides your doctor with information about the size and shape of your heart and how well your heart's chambers and valves are working. This procedure takes approximately one hour. There are no restrictions for this procedure.[place]     Follow-Up: At [facility], you and your health needs are our priority.  As part of our continuing mission to provide you with exceptional heart care, we have created designated Provider Care Teams.  These Care Teams include your primary Cardiologist (physician) and Advanced Practice Providers (APPs -  Physician Assistants and Nurse Practitioners) who all work together to provide you with the care you need, when you need it.  We recommend signing up for the patient portal called "MyChart".  Sign up information is provided on this After Visit Summary.  MyChart is used to connect with patients for Virtual Visits (Telemedicine).  Patients are able to view lab/test results, encounter notes, upcoming appointments, etc.  Non-urgent messages can be sent to your provider as well.   To learn more about what you can do with MyChart, go to [email or web address].    Your next appointment:   6 month(s)  The format for your next appointment:   In Person  Provider:   Kirk Ruths, MD

## 2021-04-23 ENCOUNTER — Ambulatory Visit (INDEPENDENT_AMBULATORY_CARE_PROVIDER_SITE_OTHER)
Admission: RE | Admit: 2021-04-23 | Discharge: 2021-04-23 | Disposition: A | Discharge status: Home / Self Care | Payer: Self-pay | Source: Ambulatory Visit | Attending: Cardiology | Admitting: Cardiology

## 2021-04-23 ENCOUNTER — Ambulatory Visit (HOSPITAL_COMMUNITY): Payer: PPO | Attending: Internal Medicine

## 2021-04-23 ENCOUNTER — Other Ambulatory Visit: Payer: Self-pay

## 2021-04-23 DIAGNOSIS — R002 Palpitations: Secondary | ICD-10-CM | POA: Insufficient documentation

## 2021-04-23 MED ORDER — PERFLUTREN LIPID MICROSPHERE
1.0000 mL | INTRAVENOUS | Status: AC | PRN
  Administered 2021-04-23: 2 mL via INTRAVENOUS

## 2021-04-24 ENCOUNTER — Telehealth: Payer: Self-pay | Admitting: *Deleted

## 2021-04-24 DIAGNOSIS — I2510 Atherosclerotic heart disease of native coronary artery without angina pectoris: Secondary | ICD-10-CM

## 2021-04-24 DIAGNOSIS — Z79899 Other long term (current) drug therapy: Secondary | ICD-10-CM

## 2021-04-24 DIAGNOSIS — R931 Abnormal findings on diagnostic imaging of heart and coronary circulation: Secondary | ICD-10-CM

## 2021-04-24 MED ORDER — ATORVASTATIN CALCIUM 80 MG PO TABS
80.0000 mg | ORAL_TABLET | Freq: Every day | ORAL | 3 refills | Status: DC
Start: 2021-04-24 — End: 2022-02-09

## 2021-04-24 NOTE — Telephone Encounter (Addendum)
Left message for pt to call    ----- Message from Lelon Perla, MD sent at 04/23/2021  3:34 PM EDT ----- Calcium score significantly elevated.  Increase Lipitor to 80 mg daily.  Check lipids and liver in 12 weeks.  Schedule stress nuclear study for screening purposes. Kirk Ruths

## 2021-04-24 NOTE — Telephone Encounter (Signed)
Pt is returning a call to [place]. Pt states Debra left a message to return call and another nurse will call her back. Please advise

## 2021-04-24 NOTE — Telephone Encounter (Signed)
Stress test consent added. Will route to MD.

## 2021-04-24 NOTE — Telephone Encounter (Signed)
Calcium score significantly elevated.  Increase Lipitor to 80 mg daily.  Check lipids and liver in 12 weeks.  Schedule stress nuclear study for screening purposes. Crystal Fox  Returned call to patient, advised patient of Crystal Fox recommendations.   Patient verbalized understanding of all instructions.   Orders have been placed. Medication list updated.

## 2021-04-25 LAB — ECHOCARDIOGRAM COMPLETE
Area-P 1/2: 4.06 cm2
S' Lateral: 2.8 cm

## 2021-04-25 NOTE — Progress Notes (Signed)
HPI: Follow-up palpitations and cardiomyopathy.  Patient recently seen for palpitations.  Calcium score April 2022 1085.  Echocardiogram April 2022 showed ejection fraction 30 to 35%, akinesis of the mid distal anteroseptal wall and apex, grade 1 diastolic dysfunction, mild mitral and tricuspid regurgitation.  Since last seen the patient denies any dyspnea on exertion, orthopnea, PND, pedal edema, palpitations, syncope or chest pain.  Current Outpatient Medications  Medication Sig Dispense Refill  . ACCU-CHEK AVIVA PLUS test strip 1 each by Other route every morning.    . Accu-Chek FastClix Lancets MISC use to self monitor blood glucose four to six times per day    . Ascorbic Acid (VITAMIN C) 500 MG CAPS See admin instructions.    Marland Kitchen aspirin 81 MG tablet Take 81 mg by mouth daily.    Marland Kitchen atorvastatin (LIPITOR) 80 MG tablet Take 1 tablet (80 mg total) by mouth daily. 90 tablet 3  . BD PEN NEEDLE NANO U/F 32G X 4 MM MISC     . Coenzyme Q10 (CO Q 10 PO) Take 1 tablet by mouth daily.    . Estradiol (YUVAFEM) 10 MCG TABS vaginal tablet PLACE 1 TABLET (10 MCG TOTAL) VAGINALLY 2 (TWO) TIMES A WEEK. 24 tablet 3  . hydrochlorothiazide (HYDRODIURIL) 25 MG tablet Take 25 mg by mouth daily.    . INVOKANA 300 MG TABS Take 300 mg by mouth daily.    Marland Kitchen latanoprost (XALATAN) 0.005 % ophthalmic solution Place 1 drop into both eyes daily.    Marland Kitchen losartan (COZAAR) 100 MG tablet Take 100 mg by mouth daily.    . metFORMIN (GLUCOPHAGE-XR) 500 MG 24 hr tablet Take 2 tablets by mouth 2 (two) times daily with a meal.    . metoprolol succinate (TOPROL XL) 25 MG 24 hr tablet Take 1 tablet (25 mg total) by mouth at bedtime. 90 tablet 3  . Multiple Vitamin (MULTIVITAMIN) capsule Take 1 capsule by mouth daily.    . TRESIBA FLEXTOUCH 200 UNIT/ML SOPN INJECT 26 UNITS ONCE ONCE DAILY  3  . TRULICITY 1.5 FU/9.3AT SOPN INJECT 1.5MG  ONCE A WEEK  3  . TURMERIC-GINGER PO Take 1 tablet by mouth daily.     No current  facility-administered medications for this visit.     Past Medical History:  Diagnosis Date  . Diabetes mellitus without complication ([place])    AODM  . Fibroid   . Glaucoma   . Hyperlipidemia   . Hypertension   . Osteoarthritis    -both knees, right foot  . Plantar fasciitis 2009    Past Surgical History:  Procedure Laterality Date  . bladder tack    . BREAST SURGERY  1973   -benign Lt.breast bx  . CHOLECYSTECTOMY OPEN  1985    Social History   Socioeconomic History  . Marital status: Married    Spouse name: Not on file  . Number of children: 5  . Years of education: Not on file  . Highest education level: Not on file  Occupational History  . Not on file  Tobacco Use  . Smoking status: Never Smoker  . Smokeless tobacco: Never Used  Vaping Use  . Vaping Use: Never used  Substance and Sexual Activity  . Alcohol use: No  . Drug use: No  . Sexual activity: Yes    Partners: Male    Birth control/protection: Post-menopausal, Other-see comments    Comment: husband vasectomy  Other Topics Concern  . Not on file  Social History  Narrative  . Not on file   Social Determinants of Health   Financial Resource Strain: Not on file  Food Insecurity: Not on file  Transportation Needs: Not on file  Physical Activity: Not on file  Stress: Not on file  Social Connections: Not on file  Intimate Partner Violence: Not on file    Family History  Problem Relation Age of Onset  . Diabetes Mother   . Hypertension Mother   . Stomach cancer Mother   . Heart attack Father        deceased from MI  . Diabetes Sister   . Diabetes Brother   . Hypertension Brother   . Cancer Paternal Grandmother        possible colon that went to liver  . Cancer Paternal Grandfather        oral cancer    ROS: no fevers or chills, productive cough, hemoptysis, dysphasia, odynophagia, melena, hematochezia, dysuria, hematuria, rash, seizure activity, orthopnea, PND, pedal edema, claudication.  Remaining systems are negative.  Physical Exam: Well-developed well-nourished in no acute distress.  Skin is warm and dry.  HEENT is normal.  Neck is supple.  Chest is clear to auscultation with normal expansion.  Cardiovascular exam is regular rate and rhythm.  Abdominal exam nontender or distended. No masses palpated. Extremities show no edema. neuro grossly intact   A/P  1 cardiomyopathy-etiology unclear but patient does have diabetes, significantly elevated calcium score and wall motion abnormalities are noted on her echocardiogram.  She will require cardiac catheterization to exclude coronary disease.  The risks and benefits including myocardial infarction, CVA and death discussed and she agrees to proceed.  We will continue Toprol.  Discontinue losartan and HCTZ and add Entresto 49/51 mg twice daily.  Check potassium and renal function in 1 week.  Titrate medications as tolerated.  2 coronary artery disease-based on calcium score that is significantly elevated.  Continue aspirin and statin.  3 hypertension-patient's blood pressure is controlled.  Change losartan and HCTZ to [facility] as outlined above given reduced LV function.  Titrate medications as tolerated.  4 hyperlipidemia-continue statin.  Check lipids and liver in 12 weeks.  Kirk Ruths, MD

## 2021-04-25 NOTE — H&P (View-Only) (Signed)
HPI: Follow-up palpitations and cardiomyopathy.  Patient recently seen for palpitations.  Calcium score April 2022 1085.  Echocardiogram April 2022 showed ejection fraction 30 to 35%, akinesis of the mid distal anteroseptal wall and apex, grade 1 diastolic dysfunction, mild mitral and tricuspid regurgitation.  Since last seen the patient denies any dyspnea on exertion, orthopnea, PND, pedal edema, palpitations, syncope or chest pain.  Current Outpatient Medications  Medication Sig Dispense Refill  . ACCU-CHEK AVIVA PLUS test strip 1 each by Other route every morning.    . Accu-Chek FastClix Lancets MISC use to self monitor blood glucose four to six times per day    . Ascorbic Acid (VITAMIN C) 500 MG CAPS See admin instructions.    Marland Kitchen aspirin 81 MG tablet Take 81 mg by mouth daily.    Marland Kitchen atorvastatin (LIPITOR) 80 MG tablet Take 1 tablet (80 mg total) by mouth daily. 90 tablet 3  . BD PEN NEEDLE NANO U/F 32G X 4 MM MISC     . Coenzyme Q10 (CO Q 10 PO) Take 1 tablet by mouth daily.    . Estradiol (YUVAFEM) 10 MCG TABS vaginal tablet PLACE 1 TABLET (10 MCG TOTAL) VAGINALLY 2 (TWO) TIMES A WEEK. 24 tablet 3  . hydrochlorothiazide (HYDRODIURIL) 25 MG tablet Take 25 mg by mouth daily.    . INVOKANA 300 MG TABS Take 300 mg by mouth daily.    Marland Kitchen latanoprost (XALATAN) 0.005 % ophthalmic solution Place 1 drop into both eyes daily.    Marland Kitchen losartan (COZAAR) 100 MG tablet Take 100 mg by mouth daily.    . metFORMIN (GLUCOPHAGE-XR) 500 MG 24 hr tablet Take 2 tablets by mouth 2 (two) times daily with a meal.    . metoprolol succinate (TOPROL XL) 25 MG 24 hr tablet Take 1 tablet (25 mg total) by mouth at bedtime. 90 tablet 3  . Multiple Vitamin (MULTIVITAMIN) capsule Take 1 capsule by mouth daily.    . TRESIBA FLEXTOUCH 200 UNIT/ML SOPN INJECT 26 UNITS ONCE ONCE DAILY  3  . TRULICITY 1.5 FU/9.3AT SOPN INJECT 1.5MG  ONCE A WEEK  3  . TURMERIC-GINGER PO Take 1 tablet by mouth daily.     No current  facility-administered medications for this visit.     Past Medical History:  Diagnosis Date  . Diabetes mellitus without complication ([place])    AODM  . Fibroid   . Glaucoma   . Hyperlipidemia   . Hypertension   . Osteoarthritis    -both knees, right foot  . Plantar fasciitis 2009    Past Surgical History:  Procedure Laterality Date  . bladder tack    . BREAST SURGERY  1973   -benign Lt.breast bx  . CHOLECYSTECTOMY OPEN  1985    Social History   Socioeconomic History  . Marital status: Married    Spouse name: Not on file  . Number of children: 5  . Years of education: Not on file  . Highest education level: Not on file  Occupational History  . Not on file  Tobacco Use  . Smoking status: Never Smoker  . Smokeless tobacco: Never Used  Vaping Use  . Vaping Use: Never used  Substance and Sexual Activity  . Alcohol use: No  . Drug use: No  . Sexual activity: Yes    Partners: Male    Birth control/protection: Post-menopausal, Other-see comments    Comment: husband vasectomy  Other Topics Concern  . Not on file  Social History  Narrative  . Not on file   Social Determinants of Health   Financial Resource Strain: Not on file  Food Insecurity: Not on file  Transportation Needs: Not on file  Physical Activity: Not on file  Stress: Not on file  Social Connections: Not on file  Intimate Partner Violence: Not on file    Family History  Problem Relation Age of Onset  . Diabetes Mother   . Hypertension Mother   . Stomach cancer Mother   . Heart attack Father        deceased from MI  . Diabetes Sister   . Diabetes Brother   . Hypertension Brother   . Cancer Paternal Grandmother        possible colon that went to liver  . Cancer Paternal Grandfather        oral cancer    ROS: no fevers or chills, productive cough, hemoptysis, dysphasia, odynophagia, melena, hematochezia, dysuria, hematuria, rash, seizure activity, orthopnea, PND, pedal edema, claudication.  Remaining systems are negative.  Physical Exam: Well-developed well-nourished in no acute distress.  Skin is warm and dry.  HEENT is normal.  Neck is supple.  Chest is clear to auscultation with normal expansion.  Cardiovascular exam is regular rate and rhythm.  Abdominal exam nontender or distended. No masses palpated. Extremities show no edema. neuro grossly intact   A/P  1 cardiomyopathy-etiology unclear but patient does have diabetes, significantly elevated calcium score and wall motion abnormalities are noted on her echocardiogram.  She will require cardiac catheterization to exclude coronary disease.  The risks and benefits including myocardial infarction, CVA and death discussed and she agrees to proceed.  We will continue Toprol.  Discontinue losartan and HCTZ and add Entresto 49/51 mg twice daily.  Check potassium and renal function in 1 week.  Titrate medications as tolerated.  2 coronary artery disease-based on calcium score that is significantly elevated.  Continue aspirin and statin.  3 hypertension-patient's blood pressure is controlled.  Change losartan and HCTZ to [facility] as outlined above given reduced LV function.  Titrate medications as tolerated.  4 hyperlipidemia-continue statin.  Check lipids and liver in 12 weeks.  Kirk Ruths, MD

## 2021-04-29 ENCOUNTER — Other Ambulatory Visit: Payer: Self-pay

## 2021-04-29 ENCOUNTER — Ambulatory Visit: Payer: PPO | Admitting: Cardiology

## 2021-04-29 ENCOUNTER — Encounter: Payer: Self-pay | Admitting: Cardiology

## 2021-04-29 ENCOUNTER — Other Ambulatory Visit (HOSPITAL_COMMUNITY)
Admission: RE | Admit: 2021-04-29 | Discharge: 2021-04-29 | Disposition: A | Discharge status: Home / Self Care | Payer: PPO | Source: Ambulatory Visit | Attending: Cardiovascular Disease | Admitting: Cardiovascular Disease

## 2021-04-29 VITALS — BP 114/72 | HR 89 | Ht 63.5 in | Wt 178.0 lb

## 2021-04-29 DIAGNOSIS — Z8249 Family history of ischemic heart disease and other diseases of the circulatory system: Secondary | ICD-10-CM | POA: Diagnosis not present

## 2021-04-29 DIAGNOSIS — R931 Abnormal findings on diagnostic imaging of heart and coronary circulation: Secondary | ICD-10-CM

## 2021-04-29 DIAGNOSIS — E7800 Pure hypercholesterolemia, unspecified: Secondary | ICD-10-CM

## 2021-04-29 DIAGNOSIS — F419 Anxiety disorder, unspecified: Secondary | ICD-10-CM | POA: Diagnosis not present

## 2021-04-29 DIAGNOSIS — I517 Cardiomegaly: Secondary | ICD-10-CM | POA: Diagnosis not present

## 2021-04-29 DIAGNOSIS — Z833 Family history of diabetes mellitus: Secondary | ICD-10-CM | POA: Diagnosis not present

## 2021-04-29 DIAGNOSIS — Z79899 Other long term (current) drug therapy: Secondary | ICD-10-CM | POA: Diagnosis not present

## 2021-04-29 DIAGNOSIS — Z79818 Long term (current) use of other agents affecting estrogen receptors and estrogen levels: Secondary | ICD-10-CM | POA: Diagnosis not present

## 2021-04-29 DIAGNOSIS — J9811 Atelectasis: Secondary | ICD-10-CM | POA: Diagnosis not present

## 2021-04-29 DIAGNOSIS — E785 Hyperlipidemia, unspecified: Secondary | ICD-10-CM | POA: Diagnosis present

## 2021-04-29 DIAGNOSIS — I10 Essential (primary) hypertension: Secondary | ICD-10-CM | POA: Diagnosis present

## 2021-04-29 DIAGNOSIS — Z01812 Encounter for preprocedural laboratory examination: Secondary | ICD-10-CM | POA: Insufficient documentation

## 2021-04-29 DIAGNOSIS — Z20822 Contact with and (suspected) exposure to covid-19: Secondary | ICD-10-CM | POA: Diagnosis present

## 2021-04-29 DIAGNOSIS — Z01818 Encounter for other preprocedural examination: Secondary | ICD-10-CM | POA: Diagnosis not present

## 2021-04-29 DIAGNOSIS — I255 Ischemic cardiomyopathy: Secondary | ICD-10-CM

## 2021-04-29 DIAGNOSIS — Z7984 Long term (current) use of oral hypoglycemic drugs: Secondary | ICD-10-CM | POA: Diagnosis not present

## 2021-04-29 DIAGNOSIS — Z882 Allergy status to sulfonamides status: Secondary | ICD-10-CM | POA: Diagnosis not present

## 2021-04-29 DIAGNOSIS — I480 Paroxysmal atrial fibrillation: Secondary | ICD-10-CM | POA: Diagnosis not present

## 2021-04-29 DIAGNOSIS — D62 Acute posthemorrhagic anemia: Secondary | ICD-10-CM | POA: Diagnosis not present

## 2021-04-29 DIAGNOSIS — E8770 Fluid overload, unspecified: Secondary | ICD-10-CM | POA: Diagnosis not present

## 2021-04-29 DIAGNOSIS — I088 Other rheumatic multiple valve diseases: Secondary | ICD-10-CM | POA: Diagnosis not present

## 2021-04-29 DIAGNOSIS — H409 Unspecified glaucoma: Secondary | ICD-10-CM | POA: Diagnosis present

## 2021-04-29 DIAGNOSIS — I25110 Atherosclerotic heart disease of native coronary artery with unstable angina pectoris: Secondary | ICD-10-CM | POA: Diagnosis not present

## 2021-04-29 DIAGNOSIS — I429 Cardiomyopathy, unspecified: Secondary | ICD-10-CM | POA: Diagnosis present

## 2021-04-29 DIAGNOSIS — R918 Other nonspecific abnormal finding of lung field: Secondary | ICD-10-CM | POA: Diagnosis not present

## 2021-04-29 DIAGNOSIS — Z7982 Long term (current) use of aspirin: Secondary | ICD-10-CM | POA: Diagnosis not present

## 2021-04-29 DIAGNOSIS — I2510 Atherosclerotic heart disease of native coronary artery without angina pectoris: Secondary | ICD-10-CM | POA: Diagnosis present

## 2021-04-29 DIAGNOSIS — I081 Rheumatic disorders of both mitral and tricuspid valves: Secondary | ICD-10-CM | POA: Diagnosis present

## 2021-04-29 DIAGNOSIS — Z01810 Encounter for preprocedural cardiovascular examination: Secondary | ICD-10-CM | POA: Diagnosis not present

## 2021-04-29 DIAGNOSIS — Z794 Long term (current) use of insulin: Secondary | ICD-10-CM | POA: Diagnosis not present

## 2021-04-29 DIAGNOSIS — Z951 Presence of aortocoronary bypass graft: Secondary | ICD-10-CM | POA: Diagnosis not present

## 2021-04-29 DIAGNOSIS — E11649 Type 2 diabetes mellitus with hypoglycemia without coma: Secondary | ICD-10-CM | POA: Diagnosis not present

## 2021-04-29 LAB — SARS CORONAVIRUS 2 (TAT 6-24 HRS): SARS Coronavirus 2: NEGATIVE

## 2021-04-29 MED ORDER — SODIUM CHLORIDE 0.9% FLUSH: 3.0000 mL | Freq: Two times a day (BID) | INTRAVENOUS | Status: DC

## 2021-04-29 NOTE — Patient Instructions (Signed)
    [facility] [address] Dept: [phone] Loc: [place]  04/29/2021   GO TO [place] AT 2:10 PM FOR COVID TESTING  You are scheduled for a Cardiac Catheterization on Thursday, May 5 with Dr. [name].  1. Please arrive at the [facility] (Main Entrance A) at [facility]: [address] [place], [place] [address] at 10:00 AM (This time is two hours before your procedure to ensure your preparation). Free valet parking service is available.   Special note: Every effort is made to have your procedure done on time. Please understand that emergencies sometimes delay scheduled procedures.  2. Diet: Do not eat solid foods after midnight.  The patient may have clear liquids until 5am upon the day of the procedure.   4. Medication instructions in preparation for your procedure:  DO NOT TAKE INVOKANA THURSDAY 5/5 MORNING  DO NOT TAKE METFORMIN THURSDAY 5/5, FRIDAY 5/6 OR SATURDAY 5/7-RESTART SUNDAY 5/8  TAKE 1/2 THE DOSE OF INSULIN 5/4 EVENING    On the morning of your procedure, take your Aspirin and any morning medicines NOT listed above.  You may use sips of water.  5. Plan for one night stay--bring personal belongings. 6. Bring a current list of your medications and current insurance cards. 7. You MUST have a responsible person to drive you home. 8. Someone MUST be with you the first 24 hours after you arrive home or your discharge will be delayed. 9. Please wear clothes that are easy to get on and off and wear slip-on shoes.  Thank you for allowing [place] to care for you!   -- [facility]

## 2021-04-30 ENCOUNTER — Telehealth: Payer: Self-pay | Admitting: *Deleted

## 2021-04-30 ENCOUNTER — Inpatient Hospital Stay (HOSPITAL_COMMUNITY): Admission: RE | Admit: 2021-04-30 | Payer: PPO | Source: Ambulatory Visit

## 2021-04-30 DIAGNOSIS — I255 Ischemic cardiomyopathy: Secondary | ICD-10-CM

## 2021-04-30 LAB — BASIC METABOLIC PANEL
BUN/Creatinine Ratio: 22 (ref 12–28)
BUN: 26 mg/dL (ref 8–27)
CO2: 19 mmol/L — ABNORMAL LOW (ref 20–29)
Calcium: 10.4 mg/dL — ABNORMAL HIGH (ref 8.7–10.3)
Chloride: 98 mmol/L (ref 96–106)
Creatinine, Ser: 1.16 mg/dL — ABNORMAL HIGH (ref 0.57–1.00)
Glucose: 183 mg/dL — ABNORMAL HIGH (ref 65–99)
Potassium: 5 mmol/L (ref 3.5–5.2)
Sodium: 137 mmol/L (ref 134–144)
eGFR: 51 mL/min/{1.73_m2} — ABNORMAL LOW (ref >59)

## 2021-04-30 LAB — CBC
Hematocrit: 39.7 % (ref 34.0–46.6)
Hemoglobin: 12.8 g/dL (ref 11.1–15.9)
MCH: 26.7 pg (ref 26.6–33.0)
MCHC: 32.2 g/dL (ref 31.5–35.7)
MCV: 83 fL (ref 79–97)
Platelets: 385 10*3/uL (ref 150–450)
RBC: 4.79 x10E6/uL (ref 3.77–5.28)
RDW: 14.8 % (ref 11.7–15.4)
WBC: 10.4 10*3/uL (ref 3.4–10.8)

## 2021-04-30 MED ORDER — SACUBITRIL-VALSARTAN 49-51 MG PO TABS
1.0000 | ORAL_TABLET | Freq: Two times a day (BID) | ORAL | 11 refills | Status: DC
Start: 2021-04-30 — End: 2021-05-08

## 2021-04-30 NOTE — Telephone Encounter (Signed)
Pt contacted pre-catheterization scheduled at [facility] for: Thursday May 01, 2021 12 Noon Verified arrival time and place: [facility]) at: 10 AM   No solid food after midnight prior to cath, clear liquids until 5 AM day of procedure.  Hold: Invokana-AM of procedure Metformin-day of procedure and 48 hours post procedure Treshiba-1/2 usual dose HS prior to procedure HCTZ-AM of procedure  Except hold medications AM meds can be  taken pre-cath with sips of water including: ASA 81 mg   Confirmed patient has responsible adult to drive home post procedure and be with patient first 24 hours after arriving home: yes  You are allowed ONE visitor in the waiting room during the time you are at the hospital for your procedure. Both you and your visitor must wear a mask once you enter the hospital.    Reviewed procedure/mask/visitor instructions with patient.

## 2021-04-30 NOTE — Telephone Encounter (Signed)
Spoke with pt, per dr Stanford Breed patient called and told to stop losartan and stop hctz. New script sent to the pharmacy for entresto 49/51 mg one tablet twice daily. .Lab orders mailed to the pt for bmp one week after starting the entresto.

## 2021-04-30 NOTE — Telephone Encounter (Signed)
PA  For entresto started in cover my meds.

## 2021-05-01 ENCOUNTER — Encounter (HOSPITAL_COMMUNITY): Payer: Self-pay | Admitting: Cardiovascular Disease

## 2021-05-01 ENCOUNTER — Other Ambulatory Visit: Payer: Self-pay

## 2021-05-01 ENCOUNTER — Inpatient Hospital Stay (HOSPITAL_COMMUNITY): Payer: PPO

## 2021-05-01 ENCOUNTER — Inpatient Hospital Stay (HOSPITAL_COMMUNITY)
Admission: AD | Admit: 2021-05-01 | Discharge: 2021-05-08 | DRG: 234 | Disposition: A | Discharge status: Home / Self Care | Payer: PPO | Source: Ambulatory Visit | Attending: Thoracic Surgery (Cardiothoracic Vascular Surgery) | Admitting: Thoracic Surgery (Cardiothoracic Vascular Surgery)

## 2021-05-01 ENCOUNTER — Encounter (HOSPITAL_COMMUNITY)
Admission: AD | Disposition: A | Discharge status: Home / Self Care | Payer: Self-pay | Source: Ambulatory Visit | Attending: Thoracic Surgery (Cardiothoracic Vascular Surgery)

## 2021-05-01 DIAGNOSIS — F419 Anxiety disorder, unspecified: Secondary | ICD-10-CM | POA: Diagnosis not present

## 2021-05-01 DIAGNOSIS — H409 Unspecified glaucoma: Secondary | ICD-10-CM | POA: Diagnosis present

## 2021-05-01 DIAGNOSIS — I2510 Atherosclerotic heart disease of native coronary artery without angina pectoris: Secondary | ICD-10-CM | POA: Diagnosis present

## 2021-05-01 DIAGNOSIS — Z7984 Long term (current) use of oral hypoglycemic drugs: Secondary | ICD-10-CM

## 2021-05-01 DIAGNOSIS — Z8249 Family history of ischemic heart disease and other diseases of the circulatory system: Secondary | ICD-10-CM

## 2021-05-01 DIAGNOSIS — E11649 Type 2 diabetes mellitus with hypoglycemia without coma: Secondary | ICD-10-CM | POA: Diagnosis not present

## 2021-05-01 DIAGNOSIS — Z794 Long term (current) use of insulin: Secondary | ICD-10-CM

## 2021-05-01 DIAGNOSIS — E785 Hyperlipidemia, unspecified: Secondary | ICD-10-CM | POA: Diagnosis present

## 2021-05-01 DIAGNOSIS — Z09 Encounter for follow-up examination after completed treatment for conditions other than malignant neoplasm: Secondary | ICD-10-CM

## 2021-05-01 DIAGNOSIS — Z01810 Encounter for preprocedural cardiovascular examination: Secondary | ICD-10-CM | POA: Diagnosis not present

## 2021-05-01 DIAGNOSIS — I081 Rheumatic disorders of both mitral and tricuspid valves: Secondary | ICD-10-CM | POA: Diagnosis present

## 2021-05-01 DIAGNOSIS — E8770 Fluid overload, unspecified: Secondary | ICD-10-CM | POA: Diagnosis not present

## 2021-05-01 DIAGNOSIS — J9811 Atelectasis: Secondary | ICD-10-CM | POA: Diagnosis not present

## 2021-05-01 DIAGNOSIS — I10 Essential (primary) hypertension: Secondary | ICD-10-CM | POA: Diagnosis present

## 2021-05-01 DIAGNOSIS — Z833 Family history of diabetes mellitus: Secondary | ICD-10-CM | POA: Diagnosis not present

## 2021-05-01 DIAGNOSIS — Z79899 Other long term (current) drug therapy: Secondary | ICD-10-CM

## 2021-05-01 DIAGNOSIS — Z79818 Long term (current) use of other agents affecting estrogen receptors and estrogen levels: Secondary | ICD-10-CM

## 2021-05-01 DIAGNOSIS — Z20822 Contact with and (suspected) exposure to covid-19: Secondary | ICD-10-CM | POA: Diagnosis present

## 2021-05-01 DIAGNOSIS — Z7982 Long term (current) use of aspirin: Secondary | ICD-10-CM

## 2021-05-01 DIAGNOSIS — I429 Cardiomyopathy, unspecified: Secondary | ICD-10-CM | POA: Diagnosis present

## 2021-05-01 DIAGNOSIS — Z882 Allergy status to sulfonamides status: Secondary | ICD-10-CM

## 2021-05-01 DIAGNOSIS — Z951 Presence of aortocoronary bypass graft: Secondary | ICD-10-CM

## 2021-05-01 DIAGNOSIS — I25110 Atherosclerotic heart disease of native coronary artery with unstable angina pectoris: Secondary | ICD-10-CM | POA: Diagnosis not present

## 2021-05-01 DIAGNOSIS — I480 Paroxysmal atrial fibrillation: Secondary | ICD-10-CM | POA: Diagnosis not present

## 2021-05-01 DIAGNOSIS — Z006 Encounter for examination for normal comparison and control in clinical research program: Secondary | ICD-10-CM

## 2021-05-01 DIAGNOSIS — D62 Acute posthemorrhagic anemia: Secondary | ICD-10-CM | POA: Diagnosis not present

## 2021-05-01 DIAGNOSIS — I519 Heart disease, unspecified: Secondary | ICD-10-CM

## 2021-05-01 DIAGNOSIS — R931 Abnormal findings on diagnostic imaging of heart and coronary circulation: Secondary | ICD-10-CM

## 2021-05-01 HISTORY — PX: LEFT HEART CATH AND CORONARY ANGIOGRAPHY: CATH118249

## 2021-05-01 LAB — GLUCOSE, CAPILLARY
Glucose-Capillary: 97 mg/dL (ref 70–99)
Glucose-Capillary: 61 mg/dL — ABNORMAL LOW (ref 70–99)
Glucose-Capillary: 94 mg/dL (ref 70–99)
Glucose-Capillary: 130 mg/dL — ABNORMAL HIGH (ref 70–99)

## 2021-05-01 LAB — URINALYSIS, ROUTINE W REFLEX MICROSCOPIC
Bilirubin Urine: NEGATIVE
Glucose, UA: >=500 mg/dL — AB
Hgb urine dipstick: NEGATIVE
Ketones, ur: 5 mg/dL — AB
Nitrite: NEGATIVE
Protein, ur: NEGATIVE mg/dL
Specific Gravity, Urine: 1.025 (ref 1.005–1.030)
WBC, UA: >50 WBC/hpf — ABNORMAL HIGH (ref 0–5)
pH: 5 (ref 5.0–8.0)

## 2021-05-01 LAB — SURGICAL PCR SCREEN
MRSA, PCR: NEGATIVE
Staphylococcus aureus: NEGATIVE

## 2021-05-01 LAB — ABO/RH: ABO/RH(D): A POS

## 2021-05-01 SURGERY — LEFT HEART CATH AND CORONARY ANGIOGRAPHY
Anesthesia: LOCAL

## 2021-05-01 MED ORDER — VERAPAMIL HCL 2.5 MG/ML IV SOLN
INTRAVENOUS | Status: DC | PRN
  Administered 2021-05-01: 10 mL via INTRA_ARTERIAL

## 2021-05-01 MED ORDER — VANCOMYCIN HCL 1000 MG IV SOLR
INTRAVENOUS | Status: DC
  Filled 2021-05-01: qty 1000

## 2021-05-01 MED ORDER — CHLORHEXIDINE GLUCONATE 0.12 % MT SOLN
15.0000 mL | Freq: Once | OROMUCOSAL | Status: AC
Start: 2021-05-02 — End: 2021-05-02
  Administered 2021-05-02: 15 mL via OROMUCOSAL
  Filled 2021-05-01: qty 15

## 2021-05-01 MED ORDER — IOHEXOL 350 MG/ML SOLN
INTRAVENOUS | Status: DC | PRN
  Administered 2021-05-01: 40 mL

## 2021-05-01 MED ORDER — LATANOPROST 0.005 % OP SOLN
1.0000 [drp] | Freq: Every day | OPHTHALMIC | Status: DC
  Administered 2021-05-01 – 2021-05-07 (×7): 1 [drp] via OPHTHALMIC
  Filled 2021-05-01 (×3): qty 2.5

## 2021-05-01 MED ORDER — INSULIN REGULAR(HUMAN) IN NACL 100-0.9 UT/100ML-% IV SOLN
INTRAVENOUS | Status: AC
  Administered 2021-05-02: 2.2 [IU]/h via INTRAVENOUS
  Filled 2021-05-01: qty 100

## 2021-05-01 MED ORDER — DEXMEDETOMIDINE HCL IN NACL 400 MCG/100ML IV SOLN
0.1000 ug/kg/h | INTRAVENOUS | Status: AC
  Administered 2021-05-02: .4 ug/kg/h via INTRAVENOUS
  Filled 2021-05-01: qty 100

## 2021-05-01 MED ORDER — HEPARIN (PORCINE) IN NACL 1000-0.9 UT/500ML-% IV SOLN
INTRAVENOUS | Status: AC
  Filled 2021-05-01: qty 500

## 2021-05-01 MED ORDER — ATORVASTATIN CALCIUM 80 MG PO TABS
80.0000 mg | ORAL_TABLET | Freq: Every day | ORAL | Status: DC
  Administered 2021-05-01 – 2021-05-02 (×2): 80 mg via ORAL
  Filled 2021-05-01 (×2): qty 1

## 2021-05-01 MED ORDER — TRANEXAMIC ACID (OHS) PUMP PRIME SOLUTION
2.0000 mg/kg | INTRAVENOUS | Status: DC
  Filled 2021-05-01: qty 1.6

## 2021-05-01 MED ORDER — LABETALOL HCL 5 MG/ML IV SOLN: 10.0000 mg | INTRAVENOUS | Status: AC | PRN

## 2021-05-01 MED ORDER — NOREPINEPHRINE 4 MG/250ML-% IV SOLN
0.0000 ug/min | INTRAVENOUS | Status: DC
  Filled 2021-05-01: qty 250

## 2021-05-01 MED ORDER — VANCOMYCIN HCL 1250 MG/250ML IV SOLN
1250.0000 mg | INTRAVENOUS | Status: AC
  Administered 2021-05-02: 1250 mg via INTRAVENOUS
  Filled 2021-05-01: qty 250

## 2021-05-01 MED ORDER — CANAGLIFLOZIN 300 MG PO TABS
300.0000 mg | ORAL_TABLET | Freq: Every morning | ORAL | Status: DC
  Filled 2021-05-01: qty 1

## 2021-05-01 MED ORDER — INSULIN REGULAR(HUMAN) IN NACL 100-0.9 UT/100ML-% IV SOLN
INTRAVENOUS | Status: DC
  Filled 2021-05-01: qty 100

## 2021-05-01 MED ORDER — MIDAZOLAM HCL 2 MG/2ML IJ SOLN
INTRAMUSCULAR | Status: AC
  Filled 2021-05-01: qty 2

## 2021-05-01 MED ORDER — TEMAZEPAM 15 MG PO CAPS
15.0000 mg | ORAL_CAPSULE | Freq: Once | ORAL | Status: AC | PRN
  Administered 2021-05-01: 15 mg via ORAL
  Filled 2021-05-01: qty 1

## 2021-05-01 MED ORDER — NOREPINEPHRINE 4 MG/250ML-% IV SOLN
0.0000 ug/min | INTRAVENOUS | Status: AC
  Administered 2021-05-02: 3 ug/min via INTRAVENOUS
  Filled 2021-05-01: qty 250

## 2021-05-01 MED ORDER — CEFAZOLIN SODIUM-DEXTROSE 2-4 GM/100ML-% IV SOLN
2.0000 g | INTRAVENOUS | Status: AC
  Administered 2021-05-02: 2 g via INTRAVENOUS
  Filled 2021-05-01: qty 100

## 2021-05-01 MED ORDER — LIDOCAINE HCL (PF) 1 % IJ SOLN
INTRAMUSCULAR | Status: DC | PRN
  Administered 2021-05-01: 2 mL

## 2021-05-01 MED ORDER — MAGNESIUM SULFATE 50 % IJ SOLN
40.0000 meq | INTRAMUSCULAR | Status: DC
  Filled 2021-05-01: qty 9.85

## 2021-05-01 MED ORDER — HEPARIN SODIUM (PORCINE) 1000 UNIT/ML IJ SOLN
INTRAMUSCULAR | Status: AC
  Filled 2021-05-01: qty 1

## 2021-05-01 MED ORDER — LORATADINE 10 MG PO TABS: 10.0000 mg | ORAL_TABLET | Freq: Every morning | ORAL | Status: DC

## 2021-05-01 MED ORDER — PHENYLEPHRINE HCL-NACL 20-0.9 MG/250ML-% IV SOLN
30.0000 ug/min | INTRAVENOUS | Status: DC
  Filled 2021-05-01: qty 250

## 2021-05-01 MED ORDER — HEPARIN SODIUM (PORCINE) 1000 UNIT/ML IJ SOLN
INTRAMUSCULAR | Status: DC | PRN
  Administered 2021-05-01: 4000 [IU] via INTRAVENOUS

## 2021-05-01 MED ORDER — MILRINONE LACTATE IN DEXTROSE 20-5 MG/100ML-% IV SOLN
0.3000 ug/kg/min | INTRAVENOUS | Status: AC
  Administered 2021-05-02: .25 ug/kg/min via INTRAVENOUS
  Filled 2021-05-01: qty 100

## 2021-05-01 MED ORDER — SODIUM CHLORIDE 0.9% FLUSH: 3.0000 mL | INTRAVENOUS | Status: DC | PRN

## 2021-05-01 MED ORDER — COQ-10 100 MG PO CAPS: 100.0000 mg | ORAL_CAPSULE | Freq: Every evening | ORAL | Status: DC

## 2021-05-01 MED ORDER — SODIUM CHLORIDE 0.9 % IV SOLN: INTRAVENOUS | Status: AC

## 2021-05-01 MED ORDER — NITROGLYCERIN IN D5W 200-5 MCG/ML-% IV SOLN
2.0000 ug/min | INTRAVENOUS | Status: DC
  Filled 2021-05-01: qty 250

## 2021-05-01 MED ORDER — EPINEPHRINE HCL 5 MG/250ML IV SOLN IN NS
0.0000 ug/min | INTRAVENOUS | Status: DC
  Filled 2021-05-01: qty 250

## 2021-05-01 MED ORDER — FENTANYL CITRATE (PF) 100 MCG/2ML IJ SOLN
INTRAMUSCULAR | Status: DC | PRN
  Administered 2021-05-01: 25 ug via INTRAVENOUS

## 2021-05-01 MED ORDER — SODIUM CHLORIDE 0.9 % WEIGHT BASED INFUSION
3.0000 mL/kg/h | INTRAVENOUS | Status: DC
  Administered 2021-05-01: 3 mL/kg/h via INTRAVENOUS

## 2021-05-01 MED ORDER — VERAPAMIL HCL 2.5 MG/ML IV SOLN
INTRAVENOUS | Status: AC
  Filled 2021-05-01: qty 2

## 2021-05-01 MED ORDER — POTASSIUM CHLORIDE 2 MEQ/ML IV SOLN
80.0000 meq | INTRAVENOUS | Status: DC
  Filled 2021-05-01: qty 40

## 2021-05-01 MED ORDER — ASPIRIN 81 MG PO CHEW: 81.0000 mg | CHEWABLE_TABLET | Freq: Every morning | ORAL | Status: DC

## 2021-05-01 MED ORDER — MILRINONE LACTATE IN DEXTROSE 20-5 MG/100ML-% IV SOLN
0.3000 ug/kg/min | INTRAVENOUS | Status: DC
  Filled 2021-05-01: qty 100

## 2021-05-01 MED ORDER — FENTANYL CITRATE (PF) 100 MCG/2ML IJ SOLN
INTRAMUSCULAR | Status: AC
  Filled 2021-05-01: qty 2

## 2021-05-01 MED ORDER — BUTALBITAL-APAP-CAFFEINE 50-325-40 MG PO TABS
1.0000 | ORAL_TABLET | Freq: Four times a day (QID) | ORAL | Status: DC | PRN
  Administered 2021-05-01: 1 via ORAL
  Filled 2021-05-01: qty 1

## 2021-05-01 MED ORDER — SODIUM CHLORIDE 0.9 % IV SOLN
INTRAVENOUS | Status: DC
  Filled 2021-05-01: qty 30

## 2021-05-01 MED ORDER — PLASMA-LYTE 148 IV SOLN
INTRAVENOUS | Status: DC
  Filled 2021-05-01: qty 2.5

## 2021-05-01 MED ORDER — CEFAZOLIN SODIUM-DEXTROSE 2-4 GM/100ML-% IV SOLN
2.0000 g | INTRAVENOUS | Status: DC
  Filled 2021-05-01: qty 100

## 2021-05-01 MED ORDER — HYDRALAZINE HCL 20 MG/ML IJ SOLN: 10.0000 mg | INTRAMUSCULAR | Status: AC | PRN

## 2021-05-01 MED ORDER — SODIUM CHLORIDE 0.9 % IV SOLN: 250.0000 mL | INTRAVENOUS | Status: DC | PRN

## 2021-05-01 MED ORDER — ACETAMINOPHEN 325 MG PO TABS: 650.0000 mg | ORAL_TABLET | Freq: Three times a day (TID) | ORAL | Status: DC | PRN

## 2021-05-01 MED ORDER — HEPARIN (PORCINE) IN NACL 1000-0.9 UT/500ML-% IV SOLN
INTRAVENOUS | Status: DC | PRN
  Administered 2021-05-01 (×3): 500 mL

## 2021-05-01 MED ORDER — VANCOMYCIN HCL 1250 MG/250ML IV SOLN
1250.0000 mg | INTRAVENOUS | Status: DC
  Filled 2021-05-01: qty 250

## 2021-05-01 MED ORDER — MIDAZOLAM HCL 2 MG/2ML IJ SOLN
INTRAMUSCULAR | Status: DC | PRN
  Administered 2021-05-01: 1 mg via INTRAVENOUS

## 2021-05-01 MED ORDER — TRANEXAMIC ACID (OHS) BOLUS VIA INFUSION
15.0000 mg/kg | INTRAVENOUS | Status: DC
  Filled 2021-05-01: qty 1197

## 2021-05-01 MED ORDER — SACUBITRIL-VALSARTAN 49-51 MG PO TABS
1.0000 | ORAL_TABLET | Freq: Two times a day (BID) | ORAL | Status: DC
  Administered 2021-05-01: 1 via ORAL
  Filled 2021-05-01: qty 1

## 2021-05-01 MED ORDER — SODIUM CHLORIDE 0.9% FLUSH
3.0000 mL | Freq: Two times a day (BID) | INTRAVENOUS | Status: DC
  Administered 2021-05-01: 3 mL via INTRAVENOUS

## 2021-05-01 MED ORDER — BISACODYL 5 MG PO TBEC
5.0000 mg | DELAYED_RELEASE_TABLET | Freq: Once | ORAL | Status: DC
  Filled 2021-05-01: qty 1

## 2021-05-01 MED ORDER — SODIUM CHLORIDE 0.9 % WEIGHT BASED INFUSION: 1.0000 mL/kg/h | INTRAVENOUS | Status: DC

## 2021-05-01 MED ORDER — CHLORHEXIDINE GLUCONATE 4 % EX LIQD
60.0000 mL | Freq: Once | CUTANEOUS | Status: AC
  Administered 2021-05-01: 4 via TOPICAL
  Filled 2021-05-01 (×2): qty 60

## 2021-05-01 MED ORDER — LIDOCAINE HCL (PF) 1 % IJ SOLN
INTRAMUSCULAR | Status: AC
  Filled 2021-05-01: qty 30

## 2021-05-01 MED ORDER — PHENYLEPHRINE HCL-NACL 20-0.9 MG/250ML-% IV SOLN
30.0000 ug/min | INTRAVENOUS | Status: AC
  Administered 2021-05-02: 30 ug/min via INTRAVENOUS
  Filled 2021-05-01: qty 250

## 2021-05-01 MED ORDER — DEXMEDETOMIDINE HCL IN NACL 400 MCG/100ML IV SOLN
0.1000 ug/kg/h | INTRAVENOUS | Status: DC
  Filled 2021-05-01: qty 100

## 2021-05-01 MED ORDER — ASPIRIN 81 MG PO CHEW
81.0000 mg | CHEWABLE_TABLET | ORAL | Status: DC
Start: 2021-05-01 — End: 2021-05-01

## 2021-05-01 MED ORDER — ONDANSETRON HCL 4 MG/2ML IJ SOLN
4.0000 mg | Freq: Four times a day (QID) | INTRAMUSCULAR | Status: DC | PRN
  Administered 2021-05-01: 4 mg via INTRAVENOUS
  Filled 2021-05-01: qty 2

## 2021-05-01 MED ORDER — CHLORHEXIDINE GLUCONATE 4 % EX LIQD
60.0000 mL | Freq: Once | CUTANEOUS | Status: AC
  Administered 2021-05-02: 4 via TOPICAL
  Filled 2021-05-01: qty 60

## 2021-05-01 MED ORDER — TRANEXAMIC ACID (OHS) BOLUS VIA INFUSION
15.0000 mg/kg | INTRAVENOUS | Status: AC
  Administered 2021-05-02: 1197 mg via INTRAVENOUS
  Filled 2021-05-01: qty 1197

## 2021-05-01 MED ORDER — METOPROLOL TARTRATE 12.5 MG HALF TABLET
12.5000 mg | ORAL_TABLET | Freq: Once | ORAL | Status: AC
  Administered 2021-05-02: 12.5 mg via ORAL
  Filled 2021-05-01: qty 1

## 2021-05-01 MED ORDER — TRANEXAMIC ACID 1000 MG/10ML IV SOLN
1.5000 mg/kg/h | INTRAVENOUS | Status: DC
  Filled 2021-05-01: qty 25

## 2021-05-01 MED ORDER — TRANEXAMIC ACID 1000 MG/10ML IV SOLN
1.5000 mg/kg/h | INTRAVENOUS | Status: AC
  Administered 2021-05-02: 1.5 mg/kg/h via INTRAVENOUS
  Filled 2021-05-01: qty 25

## 2021-05-01 MED ORDER — ACETAMINOPHEN 325 MG PO TABS
650.0000 mg | ORAL_TABLET | ORAL | Status: DC | PRN
  Administered 2021-05-01: 650 mg via ORAL
  Filled 2021-05-01: qty 2

## 2021-05-01 MED ORDER — METOPROLOL SUCCINATE ER 25 MG PO TB24
25.0000 mg | ORAL_TABLET | Freq: Every day | ORAL | Status: DC
  Administered 2021-05-01: 25 mg via ORAL
  Filled 2021-05-01: qty 1

## 2021-05-01 SURGICAL SUPPLY — 9 items
CATH 5FR JL3.5 JR4 ANG PIG MP (CATHETERS) ×2 IMPLANT
DEVICE RAD COMP TR BAND LRG (VASCULAR PRODUCTS) ×2 IMPLANT
GLIDESHEATH SLEND SS 6F .021 (SHEATH) ×2 IMPLANT
GUIDEWIRE INQWIRE 1.5J.035X260 (WIRE) ×1 IMPLANT
INQWIRE 1.5J .035X260CM (WIRE) ×2
KIT HEART LEFT (KITS) ×2 IMPLANT
PACK CARDIAC CATHETERIZATION (CUSTOM PROCEDURE TRAY) ×2 IMPLANT
TRANSDUCER W/STOPCOCK (MISCELLANEOUS) ×2 IMPLANT
TUBING CIL FLEX 10 FLL-RA (TUBING) ×2 IMPLANT

## 2021-05-01 NOTE — Consult Note (Addendum)
[address]       [place] [address]             [phone]        Nikka B Wrinkle Whitman Medical Record [id number] Date of Birth: 1951/10/22  Referring: No ref. provider found Primary Care: Burnard Bunting, MD Primary Cardiologist:None  Chief Complaint: Palpitations  History of Present Illness:    Patient is a 70 year old female recently seen in cardiology evaluation for complaints of palpitations with work-up notable for  cardiomyopathy of uncertain etiology.  She underwent CT scan and calcium score was noted to be significantly elevated at 1085.  Additionally she underwent an echocardiogram in April 2022 which showed reduced ejection fraction at 30 to 35% with akinesis of the mid distal anteroseptal wall and apex, grade 1 diastolic dysfunction, mild mitral and tricuspid regurgitation.  She began to have symptoms last December, specifically palpitations.  Recently she has been asymptomatic and denies chest pain, palpitations, lower extremity edema, PND, or syncope.  She does have significant cardiac risk factors including diabetes, hyperlipidemia and hypertension.  She is a lifelong non-smoker.  She does have a history of cardiac disease in her family with father deceased from myocardial infarction.  Due to her recent symptoms and above findings it was felt that she should undergo diagnostic cardiac catheterization which first performed on today's date.  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  See the full report below.  Due to multivessel disease as well as history of diabetes cardiology request consultation with CT surgery for consideration of coronary artery surgical revascularization.   Current Activity/ Functional Status: Patient is independent with mobility/ambulation, transfers, ADL's, IADL's.   Zubrod Score: At the time of surgery this patient's most appropriate activity status/level  should be described as: []     0    Normal activity, no symptoms []     1    Restricted in physical strenuous activity but ambulatory, able to do out light work []     2    Ambulatory and capable of self care, unable to do work activities, up and about                 more than 50%  Of the time                            []     3    Only limited self care, in bed greater than 50% of waking hours []     4    Completely disabled, no self care, confined to bed or chair []     5    Moribund  Past Medical History:  Diagnosis Date  . Diabetes mellitus without complication ([place])    AODM  . Fibroid   . Glaucoma   . Hyperlipidemia   . Hypertension   . Osteoarthritis    -both knees, right foot  . Plantar fasciitis 2009    Past Surgical History:  Procedure Laterality Date  . bladder tack    . BREAST SURGERY  1973   -benign Lt.breast bx  . CHOLECYSTECTOMY OPEN  1985    Social History   Tobacco Use  Smoking Status Never Smoker  Smokeless Tobacco Never Used    Social History   Substance and Sexual Activity  Alcohol Use No     Allergies  Allergen Reactions  . Sulfa Antibiotics Rash  Current Facility-Administered Medications  Medication Dose Route Frequency Provider Last Rate Last Admin  . 0.9 %  sodium chloride infusion  250 mL Intravenous PRN Lelon Perla, MD      . 0.9 %  sodium chloride infusion   Intravenous Continuous [name] D, MD      . 0.9% sodium chloride infusion  1 mL/kg/hr Intravenous Continuous Lelon Perla, MD 80.7 mL/hr at 05/01/21 1203 1 mL/kg/hr at 05/01/21 1203  . aspirin chewable tablet 81 mg  81 mg Oral Pre-Cath Crenshaw, Denice Bors, MD      . sodium chloride flush (NS) 0.9 % injection 3 mL  3 mL Intravenous PRN Lelon Perla, MD        Facility-Administered Medications Prior to Admission  Medication Dose Route Frequency Provider Last Rate Last Admin  . sodium chloride flush (NS) 0.9 % injection 3 mL  3 mL Intravenous Q12H Lelon Perla, MD       Medications Prior to Admission  Medication Sig Dispense Refill Last Dose  . Ascorbic Acid (VITAMIN C GUMMIES PO) Take 250 mg by mouth in the morning.   04/30/2021 at Unknown time  . aspirin 81 MG chewable tablet Chew 81 mg by mouth in the morning.   05/01/2021 at [place]  . atorvastatin (LIPITOR) 80 MG tablet Take 1 tablet (80 mg total) by mouth daily. (Patient taking differently: Take 80 mg by mouth at bedtime.) 90 tablet 3 04/30/2021 at 200  . Calcium Carb-Cholecalciferol (CALCIUM 500 + D3 PO) Take 1 tablet by mouth daily at 2 PM.   04/30/2021 at 1400  . Coenzyme Q10 (COQ-10) 100 MG CAPS Take 100 mg by mouth every evening.   04/30/2021 at 2000  . Estradiol (YUVAFEM) 10 MCG TABS vaginal tablet PLACE 1 TABLET (10 MCG TOTAL) VAGINALLY 2 (TWO) TIMES A WEEK. (Patient taking differently: Place 10 mcg vaginally 2 (two) times a week. Tuesdays & Fridays) 24 tablet 3 04/29/2021  . INVOKANA 300 MG TABS Take 300 mg by mouth in the morning.   04/30/2021 at 1900  . latanoprost (XALATAN) 0.005 % ophthalmic solution Place 1 drop into both eyes at bedtime.     Marland Kitchen loratadine (CLARITIN) 10 MG tablet Take 10 mg by mouth in the morning.   04/30/2021 at Unknown time  . metFORMIN (GLUCOPHAGE-XR) 500 MG 24 hr tablet Take 1,000 mg by mouth 2 (two) times daily with a meal.   04/30/2021  . metoprolol succinate (TOPROL XL) 25 MG 24 hr tablet Take 1 tablet (25 mg total) by mouth at bedtime. 90 tablet 3 04/30/2021 at 2000  . Multiple Vitamin (MULTIVITAMIN WITH MINERALS) TABS tablet Take 1 tablet by mouth daily at 2 PM. Multivitamin w/Omega-3   04/30/2021 at 1400  . TRESIBA FLEXTOUCH 200 UNIT/ML SOPN Inject 26 Units into the skin at bedtime.  3 04/30/2021 at 2230  . TRULICITY 1.5 LT/9.0ZE SOPN Inject 1.5 mg into the skin every Wednesday.  3 04/30/2021  . TURMERIC-GINGER PO Take 1 capsule by mouth daily in the afternoon.   04/30/2021 at Unknown time  . ACCU-CHEK AVIVA PLUS test strip 1 each by Other route every morning.     . Accu-Chek  FastClix Lancets MISC use to self monitor blood glucose four to six times per day     . BD PEN NEEDLE NANO U/F 32G X 4 MM MISC      . sacubitril-valsartan (ENTRESTO) 49-51 MG Take 1 tablet by mouth 2 (two) times daily. 60 tablet 11  not started    Family History  Problem Relation Age of Onset  . Diabetes Mother   . Hypertension Mother   . Stomach cancer Mother   . Heart attack Father        deceased from MI  . Diabetes Sister   . Diabetes Brother   . Hypertension Brother   . Cancer Paternal Grandmother        possible colon that went to liver  . Cancer Paternal Grandfather        oral cancer     Review of Systems:   Review of Systems  Constitutional: Positive for weight loss. Negative for chills, diaphoresis, fever and malaise/fatigue.       Intentional weight loss  HENT: Negative for congestion, ear discharge, ear pain ( But risk factors notable for history of hypertension, hyperlipidemia, and type 2 diabetes mellitus.), hearing loss, nosebleeds, sinus pain, sore throat and tinnitus.   Eyes: Negative for blurred vision, double vision, photophobia, pain, discharge and redness.  Respiratory: Positive for cough. Negative for hemoptysis, sputum production, shortness of breath, wheezing and stridor.   Cardiovascular: Negative for chest pain, palpitations, orthopnea, claudication, leg swelling and PND.  Gastrointestinal: Negative for abdominal pain, blood in stool, constipation, diarrhea, heartburn, melena, nausea and vomiting.  Genitourinary: Negative for dysuria, flank pain, frequency, hematuria and urgency.  Musculoskeletal: Positive for joint pain. Negative for back pain, falls, myalgias and neck pain.       Left knee arthritis - better recently  Skin: Negative.   Neurological: Negative for dizziness, tingling, tremors, sensory change, speech change, focal weakness, seizures, loss of consciousness, weakness and headaches.  Endo/Heme/Allergies: Positive for environmental allergies.  Negative for polydipsia. Does not bruise/bleed easily.  Psychiatric/Behavioral: Negative.  Negative for depression, hallucinations, memory loss, substance abuse and suicidal ideas. The patient is not nervous/anxious and does not have insomnia.       Physical Exam: BP (!) 136/51   Pulse 81   Temp 97.9 F (36.6 C) (Oral)   Resp 15   Ht 5' 3.5" (1.613 m)   Wt 79.8 kg   LMP 12/28/2004 (Approximate)   SpO2 100%   BMI 30.69 kg/m    Physical Exam  Constitutional: She appears healthy. No distress.  HENT:  Mouth/Throat: Dentition is normal. No dental caries. Oropharynx is clear. Pharynx is normal.  Eyes: Pupils are equal, round, and reactive to light. Conjunctivae are normal.  Neck: No JVD present. No neck adenopathy. No thyromegaly present.  Cardiovascular: Normal rate, regular rhythm and normal heart sounds. Exam reveals no gallop.  No murmur heard. Pulses:      Dorsalis pedis pulses are 2+ on the right side and 0 on the left side.       Posterior tibial pulses are 0 on the right side and 0 on the left side.  No carotid bruits  Pulmonary/Chest: Breath sounds normal. She has no wheezes. She has no rales. She exhibits no tenderness.  Abdominal: Soft. Bowel sounds are normal. She exhibits no distension and no mass. There is no hepatomegaly. There is no abdominal tenderness.  Musculoskeletal:        General: No tenderness, deformity or edema.     Cervical back: Normal range of motion and neck supple.  Neurological: She is alert and oriented to person, place, and time.  Skin: Skin is warm and dry. No rash noted. No cyanosis. No jaundice or pallor. Nails show no clubbing.   Diagnostic Studies & Laboratory data:     Recent  Radiology Findings:    LEFT HEART CATH AND CORONARY ANGIOGRAPHY    Conclusion    Prox RCA to Mid RCA lesion is 50% stenosed.  RPAV lesion is 40% stenosed.  Mid RCA lesion is 95% stenosed.  Ramus lesion is 99% stenosed.  Mid Cx lesion is 60%  stenosed.  Prox LAD to Mid LAD lesion is 100% stenosed.   1. Chronic occlusion mid LAD with faint collateral filling from left to left collaterals 2. Severe stenosis in the small to moderate caliber ramus intermediate branch 3. Moderate stenosis in the mid Circumflex 4. Severe stenosis in the mid segment of the large, dominant RCA. The proximal and mid vessel is calcified.   Recommendations: She will need CT surgery consultation for CABG given multi-vessel CAD, CTO of the LAD and history of diabetes. She is asymptomatic at this time but she does have LV systolic dysfunction. I think revascularization is indicated. We have discussed discharge home and outpatient CT surgery consultation but she would like to remain in the hospital until she meets with the surgeon. Will arrange a telemetry bed.    Indications  Coronary artery disease involving native coronary artery of native heart without angina pectoris [I25.10 (ICD-10-CM)]   Procedural Details  Technical Details Indication: 70 yo female with history of DM, coronary calcification by recent CT calcium score and new finding of LV systolic dysfunction. She has no angina or dyspnea.   Procedure: The risks, benefits, complications, treatment options, and expected outcomes were discussed with the patient. The patient and/or family concurred with the proposed plan, giving informed consent. The patient was brought to the cath lab after IV hydration was given. The patient was further sedated with Versed and Fentanyl. The right wrist was prepped and draped in a sterile fashion. 1% lidocaine was used for local anesthesia. Using the modified Seldinger access technique, a 5 French sheath was placed in the right radial artery. 3 mg Verapamil was given through the sheath. 4000 units IV heparin was given. Standard diagnostic catheters were used to perform selective coronary angiography. A pigtail catheter was used to measure LV pressures. No LV gram performed.  The sheath was removed from the right radial artery and a Terumo hemostasis band was applied at the arteriotomy site on the right wrist.    Estimated blood loss <50 mL.   During this procedure medications were administered to achieve and maintain moderate conscious sedation while the patient's heart rate, blood pressure, and oxygen saturation were continuously monitored and I was present face-to-face 100% of this time.   Medications (Filter: Administrations occurring from 1234 to 1319 on 05/01/21) (important) Continuous medications are totaled by the amount administered until 05/01/21 1319.    fentaNYL (SUBLIMAZE) injection (mcg) Total dose:  25 mcg  Date/Time Rate/Dose/Volume Action   05/01/21 1250 25 mcg Given    midazolam (VERSED) injection (mg) Total dose:  1 mg  Date/Time Rate/Dose/Volume Action   05/01/21 1250 1 mg Given    Heparin (Porcine) in NaCl 1000-0.9 UT/500ML-% SOLN (mL) Total volume:  1,500 mL  Date/Time Rate/Dose/Volume Action   05/01/21 1300 500 mL Given   1300 500 mL Given   1300 500 mL Given    lidocaine (PF) (XYLOCAINE) 1 % injection (mL) Total volume:  2 mL  Date/Time Rate/Dose/Volume Action   05/01/21 1301 2 mL Given    Radial Cocktail/Verapamil only (mL) Total volume:  10 mL  Date/Time Rate/Dose/Volume Action   05/01/21 1302 10 mL Given    heparin sodium (porcine)  injection (Units) Total dose:  4,000 Units  Date/Time Rate/Dose/Volume Action   05/01/21 1303 4,000 Units Given    iohexol (OMNIPAQUE) 350 MG/ML injection (mL) Total volume:  40 mL  Date/Time Rate/Dose/Volume Action   05/01/21 1313 40 mL Given    Sedation Time  Sedation Time Physician-1: 23 minutes 34 seconds   Contrast  Medication Name Total Dose  iohexol (OMNIPAQUE) 350 MG/ML injection 40 mL    Radiation/Fluoro  Fluoro time: 3 (min) DAP: 80165 (mGycm2) Cumulative Air Kerma: 537 (mGy)   Complications   Complications documented before study signed  (05/01/2021 1:30 PM)     LEFT HEART CATH AND CORONARY ANGIOGRAPHY  None Documented by Burnell Blanks, MD 05/01/2021 1:28 PM  Date Found: 05/01/2021  Time Range: Intraprocedure       Coronary Findings   Diagnostic Dominance: Right  Left Anterior Descending  Vessel is large.  Prox LAD to Mid LAD lesion is 100% stenosed. The lesion is chronically occluded.  Second Septal Branch  Collaterals  2nd Sept filled by collaterals from 1st Sept.    Ramus Intermedius  Vessel is moderate in size.  Ramus lesion is 99% stenosed.  Left Circumflex  Mid Cx lesion is 60% stenosed.  Right Coronary Artery  Vessel is large.  Prox RCA to Mid RCA lesion is 50% stenosed. The lesion is calcified.  Mid RCA lesion is 95% stenosed. The lesion is calcified.  Right Posterior Atrioventricular Artery  RPAV lesion is 40% stenosed.   Intervention   No interventions have been documented.  Coronary Diagrams   Diagnostic Dominance: Right    Intervention    Implants    No implant documentation for this case.    Syngo Images  Show images for CARDIAC CATHETERIZATION  Images on Long Term Storage  Show images for Angeni, Chaudhuri to Procedure Log  Procedure Log     Hemo Data  Flowsheet Row Most Recent Value  AO Systolic Pressure 482 mmHg  AO Diastolic Pressure 52 mmHg  AO Mean 77 mmHg  LV Systolic Pressure 707 mmHg  LV Diastolic Pressure 7 mmHg  LV EDP 12 mmHg  AOp Systolic Pressure 867 mmHg  AOp Diastolic Pressure 53 mmHg  AOp Mean Pressure 81 mmHg  LVp Systolic Pressure 544 mmHg  LVp Diastolic Pressure 8 mmHg  LVp EDP Pressure 12 mmHg   Encounter-Level Documents on 05/01/2021:  Scan on 05/01/2021 1:13 PM by Default, Provider, MD Electronic signature on 05/01/2021 9:55 AM - E-signed Electronic signature on 04/30/2021 3:24 PM - 1 of 3 e-signatures recorded      Order-Level Documents:  There are no order-level documents.  Signed  Electronically signed  by Burnell Blanks, MD on 05/01/21 at 1330 EDT       ECHOCARDIOGRAM REPORT       Patient Name:  Domingo Pulse Date of Exam: 04/23/2021  Medical Rec #: [id number]    Height:    63.5 in  Accession #:  [id number]   Weight:    183.0 lb  Date of Birth: 1951-11-25    BSA:     1.873 m  Patient Age:  12 years    BP:      124/62 mmHg  Patient Gender: F        HR:      72 bpm.  Exam Location: [facility]   Procedure: 2D Echo, Cardiac Doppler, Color Doppler and Intracardiac       Opacification Agent   Indications:  R00.2 Palpitations  History:    Patient has no prior history of Echocardiogram  examinations.         Risk Factors:Hypertension, Diabetes and Dyslipidemia.    Sonographer:  Diamond Nickel RCS  Referring Phys: [place]    1. Left ventricular ejection fraction, by estimation, is 30 to 35%. The  left ventricle has moderately decreased function. The left ventricle  demonstrates regional wall motion abnormalities (see scoring  diagram/findings for description). Left ventricular  diastolic parameters are consistent with Grade I diastolic dysfunction  (impaired relaxation).  2. Right ventricular systolic function is normal. The right ventricular  size is normal. There is normal pulmonary artery systolic pressure. The  estimated right ventricular systolic pressure is 99991111 mmHg.  3. The mitral valve is normal in structure. Mild mitral valve  regurgitation. No evidence of mitral stenosis.  4. The aortic valve is grossly normal. Aortic valve regurgitation is not  visualized. No aortic stenosis is present.  5. The inferior vena cava is normal in size with greater than 50%  respiratory variability, suggesting right atrial pressure of 3 mmHg.   Conclusion(s)/Recommendation(s): No left ventricular mural or apical  thrombus/thrombi.   FINDINGS  Left Ventricle:  Left ventricular ejection fraction, by estimation, is 30  to 35%. The left ventricle has moderately decreased function. The left  ventricle demonstrates regional wall motion abnormalities. The left  ventricular internal cavity size was  normal in size. There is no asymmetric left ventricular hypertrophy of the  basal-septal segment. Left ventricular diastolic parameters are consistent  with Grade I diastolic dysfunction (impaired relaxation).     LV Wall Scoring:  The mid and distal anterior septum and mid inferoseptal segment are  akinetic.  The mid and distal inferior wall, basal anteroseptal segment, apical  anterior  segment, and basal inferoseptal segment are hypokinetic.   Right Ventricle: The right ventricular size is normal. No increase in  right ventricular wall thickness. Right ventricular systolic function is  normal. There is normal pulmonary artery systolic pressure. The tricuspid  regurgitant velocity is 2.34 m/s, and  with an assumed right atrial pressure of 3 mmHg, the estimated right  ventricular systolic pressure is 99991111 mmHg.   Left Atrium: Left atrial size was normal in size.   Right Atrium: Right atrial size was normal in size.   Pericardium: There is no evidence of pericardial effusion.   Mitral Valve: The mitral valve is normal in structure. Mild mitral valve  regurgitation. No evidence of mitral valve stenosis.   Tricuspid Valve: The tricuspid valve is normal in structure. Tricuspid  valve regurgitation is mild . No evidence of tricuspid stenosis.   Aortic Valve: The aortic valve is grossly normal. Aortic valve  regurgitation is not visualized. No aortic stenosis is present.   Pulmonic Valve: The pulmonic valve was normal in structure. Pulmonic valve  regurgitation is trivial. No evidence of pulmonic stenosis.   Aorta: The aortic root is normal in size and structure.   Venous: The inferior vena cava is normal in size with greater than 50%   respiratory variability, suggesting right atrial pressure of 3 mmHg.   IAS/Shunts: No atrial level shunt detected by color flow Doppler.     LEFT VENTRICLE  PLAX 2D  LVIDd:     4.50 cm Diastology  LVIDs:     2.80 cm LV e' medial:  5.33 cm/s  LV PW:     0.90 cm LV E/e' medial: 17.0  LV IVS:    1.10  cm LV e' lateral:  4.57 cm/s  LVOT diam:   1.70 cm LV E/e' lateral: 19.9  LV SV:     47  LV SV Index:  25  LVOT Area:   2.27 cm     RIGHT VENTRICLE  RV Basal diam: 2.30 cm  RV S prime:   12.00 cm/s  TAPSE (M-mode): 2.1 cm  RVSP:      24.9 mmHg   LEFT ATRIUM      Index    RIGHT ATRIUM      Index  LA diam:   3.30 cm 1.76 cm/m RA Pressure: 3.00 mmHg  LA Vol (A2C): 35.0 ml 18.69 ml/m RA Area:   11.30 cm  LA Vol (A4C): 47.6 ml 25.41 ml/m RA Volume:  23.00 ml 12.28 ml/m  AORTIC VALVE  LVOT Vmax:  83.60 cm/s  LVOT Vmean: 59.700 cm/s  LVOT VTI:  0.206 m    AORTA  Ao Root diam: 2.50 cm   MITRAL VALVE        TRICUSPID VALVE  MV Area (PHT): 4.06 cm   TR Peak grad:  21.9 mmHg  MV Decel Time: 187 msec   TR Vmax:    234.00 cm/s  MV E velocity: 90.80 cm/s  Estimated RAP: 3.00 mmHg  MV A velocity: 104.00 cm/s RVSP:      24.9 mmHg  MV E/A ratio: 0.87               SHUNTS               Systemic VTI: 0.21 m               Systemic Diam: 1.70 cm   Cherlynn Kaiser MD  Electronically signed by Cherlynn Kaiser MD  Signature Date/Time: 04/25/2021/7:58:17 AM    I have independently reviewed the above radiologic studies and discussed with the patient   Recent Lab Findings: Lab Results  Component Value Date   WBC 10.4 04/29/2021   HGB 12.8 04/29/2021   HCT 39.7 04/29/2021   PLT 385 04/29/2021   GLUCOSE 183 (H) 04/29/2021   NA 137 04/29/2021   K 5.0 04/29/2021   CL 98 04/29/2021   CREATININE 1.16 (H) 04/29/2021   BUN 26 04/29/2021   CO2 19  (L) 04/29/2021      Assessment / Plan: Severe multivessel coronary artery disease Diabetes mellitus x20 years Hypertension Hyperlipidemia Osteoarthritis primarily right knee Glaucoma History of fibroids  Plan: Patient studies will be evaluated by Dr. Roxy Manns.  She appears to be a good candidate for proceeding with CABG.  I  spent 40 minutes counseling the patient face to face.   John Giovanni, PA-C 05/01/2021 2:05 PM   I have seen and examined the patient and agree with the assessment and plan as outlined above by Jadene Pierini, PA-C  Patient is a 70 year old female with no previous history of coronary artery disease but risk factors notable for history of type 2 diabetes mellitus, hypertension, and hyperlipidemia.  She presented to her primary care physician several months ago with atypical symptoms of palpitations and was referred to Dr. Stanford Breed for cardiology consultation.  Gated noncontrast cardiac CT scan revealed elevated calcium score and echocardiogram revealed decreased left ventricular function with ejection fraction estimated only 30 to 35% with akinesis of the mid distal anteroseptal wall and apex.  She underwent elective diagnostic cardiac catheterization today which revealed severe multivessel coronary artery disease including proximal occlusion of the left anterior descending coronary artery and high-grade  stenosis of the right coronary artery.  I have personally reviewed the patient's history, physical exam, echocardiogram, and diagnostic cardiac catheterization.  I agree the patient would benefit from surgical revascularization in terms of decreased risk of future myocardial events and prolonged survival.  I have reviewed the indications, risks, and potential benefits of coronary artery bypass grafting with the patient and her family.  Alternative treatment strategies have been discussed, including the relative risks, benefits and long term prognosis associated with medical  therapy, percutaneous coronary intervention, and surgical revascularization.  The patient understands and accepts all potential associated risks of surgery including but not limited to risk of death, stroke or other neurologic complication, myocardial infarction, congestive heart failure, respiratory failure, renal failure, bleeding requiring blood transfusion and/or reexploration, aortic dissection or other major vascular complication, arrhythmia, heart block or bradycardia requiring permanent pacemaker, pneumonia, pleural effusion, wound infection, pulmonary embolus or other thromboembolic complication, chronic pain or other delayed complications related to median sternotomy, or the late recurrence of symptomatic ischemic heart disease and/or congestive heart failure.  The importance of long term risk modification have been emphasized.  All questions answered.   I spent in excess of 30 minutes during the conduct of this hospital encounter and >50% of this time involved direct face-to-face encounter with the patient for counseling and/or coordination of their care.    Rexene Alberts, MD 05/01/2021 4:26 PM

## 2021-05-01 NOTE — Progress Notes (Addendum)
Patient on the board for a CABG tomorrow 0715.  No orders for CABG prep at this time.  Night coverage for TCTS notified and stated that the scheduled surgeon would likely place the orders.    2030-Orders placed by provider.

## 2021-05-01 NOTE — Progress Notes (Signed)
Pre-CABG exam has been completed.  Results can be found under chart review under CV PROC. 05/01/2021 6:03 PM Nolawi Kanady RVT, RDMS

## 2021-05-01 NOTE — Progress Notes (Signed)
TCTS consulted for CABG evaluation. °

## 2021-05-01 NOTE — Interval H&P Note (Signed)
History and Physical Interval Note:  05/01/2021 12:42 PM  Crystal Fox  has presented today for surgery, with the diagnosis of Elevated Calcium Score.  The various methods of treatment have been discussed with the patient and family. After consideration of risks, benefits and other options for treatment, the patient has consented to  Procedure(s): LEFT HEART CATH AND CORONARY ANGIOGRAPHY (N/A) as a surgical intervention.  The patient's history has been reviewed, patient examined, no change in status, stable for surgery.  I have reviewed the patient's chart and labs.  Questions were answered to the patient's satisfaction.    Cath Lab Visit (complete for each Cath Lab visit)  Clinical Evaluation Leading to the Procedure:   ACS: No.  Non-ACS:    Anginal Classification: No Symptoms  Anti-ischemic medical therapy: Maximal Therapy (2 or more classes of medications)  Non-Invasive Test Results: No non-invasive testing performed  Prior CABG: No previous CABG        Crystal Fox

## 2021-05-01 NOTE — Research (Signed)
IDENTIFY Informed Consent                  Subject Name: Crystal Fox    Subject met inclusion and exclusion criteria.  The informed consent form, study requirements and expectations were reviewed with the subject and questions and concerns were addressed prior to the signing of the consent form.  The subject verbalized understanding of the trial requirements.  The subject agreed to participate in the IDENTIFY trial and signed the informed consent at 10:22AM on 05/01/21.  The informed consent was obtained prior to performance of any protocol-specific procedures for the subject.  A copy of the signed informed consent was given to the subject and a copy was placed in the subject's medical record.   Meade Maw , Naval architect

## 2021-05-02 ENCOUNTER — Inpatient Hospital Stay (HOSPITAL_COMMUNITY): Payer: PPO

## 2021-05-02 ENCOUNTER — Inpatient Hospital Stay (HOSPITAL_COMMUNITY)
Admission: AD | Disposition: A | Discharge status: Home / Self Care | Payer: Self-pay | Source: Ambulatory Visit | Attending: Thoracic Surgery (Cardiothoracic Vascular Surgery)

## 2021-05-02 ENCOUNTER — Encounter (HOSPITAL_COMMUNITY): Payer: Self-pay | Admitting: Cardiovascular Disease

## 2021-05-02 ENCOUNTER — Inpatient Hospital Stay (HOSPITAL_COMMUNITY): Payer: PPO | Admitting: Certified Registered Nurse Anesthetist

## 2021-05-02 DIAGNOSIS — Z951 Presence of aortocoronary bypass graft: Secondary | ICD-10-CM

## 2021-05-02 HISTORY — PX: TEE WITHOUT CARDIOVERSION: SHX5443

## 2021-05-02 HISTORY — DX: Presence of aortocoronary bypass graft: Z95.1

## 2021-05-02 HISTORY — PX: CORONARY ARTERY BYPASS GRAFT: SHX141

## 2021-05-02 LAB — POCT I-STAT, CHEM 8
BUN: 21 mg/dL (ref 8–23)
BUN: 21 mg/dL (ref 8–23)
BUN: 17 mg/dL (ref 8–23)
BUN: 17 mg/dL (ref 8–23)
Calcium, Ion: 1.28 mmol/L (ref 1.15–1.40)
Calcium, Ion: 1.27 mmol/L (ref 1.15–1.40)
Calcium, Ion: 1.04 mmol/L — ABNORMAL LOW (ref 1.15–1.40)
Calcium, Ion: 1.18 mmol/L (ref 1.15–1.40)
Chloride: 102 mmol/L (ref 98–111)
Chloride: 102 mmol/L (ref 98–111)
Chloride: 101 mmol/L (ref 98–111)
Chloride: 103 mmol/L (ref 98–111)
Creatinine, Ser: 1 mg/dL (ref 0.44–1.00)
Creatinine, Ser: 0.9 mg/dL (ref 0.44–1.00)
Creatinine, Ser: 0.8 mg/dL (ref 0.44–1.00)
Creatinine, Ser: 0.7 mg/dL (ref 0.44–1.00)
Glucose, Bld: 136 mg/dL — ABNORMAL HIGH (ref 70–99)
Glucose, Bld: 110 mg/dL — ABNORMAL HIGH (ref 70–99)
Glucose, Bld: 92 mg/dL (ref 70–99)
Glucose, Bld: 131 mg/dL — ABNORMAL HIGH (ref 70–99)
HCT: 35 % — ABNORMAL LOW (ref 36.0–46.0)
HCT: 33 % — ABNORMAL LOW (ref 36.0–46.0)
HCT: 23 % — ABNORMAL LOW (ref 36.0–46.0)
HCT: 27 % — ABNORMAL LOW (ref 36.0–46.0)
Hemoglobin: 11.9 g/dL — ABNORMAL LOW (ref 12.0–15.0)
Hemoglobin: 11.2 g/dL — ABNORMAL LOW (ref 12.0–15.0)
Hemoglobin: 7.8 g/dL — ABNORMAL LOW (ref 12.0–15.0)
Hemoglobin: 9.2 g/dL — ABNORMAL LOW (ref 12.0–15.0)
Potassium: 3.3 mmol/L — ABNORMAL LOW (ref 3.5–5.1)
Potassium: 3.3 mmol/L — ABNORMAL LOW (ref 3.5–5.1)
Potassium: 3.8 mmol/L (ref 3.5–5.1)
Potassium: 3.8 mmol/L (ref 3.5–5.1)
Sodium: 137 mmol/L (ref 135–145)
Sodium: 137 mmol/L (ref 135–145)
Sodium: 137 mmol/L (ref 135–145)
Sodium: 137 mmol/L (ref 135–145)
TCO2: 23 mmol/L (ref 22–32)
TCO2: 23 mmol/L (ref 22–32)
TCO2: 23 mmol/L (ref 22–32)
TCO2: 23 mmol/L (ref 22–32)

## 2021-05-02 LAB — BASIC METABOLIC PANEL
Anion gap: 16 — ABNORMAL HIGH (ref 5–15)
Anion gap: 16 — ABNORMAL HIGH (ref 5–15)
BUN: 22 mg/dL (ref 8–23)
BUN: 16 mg/dL (ref 8–23)
CO2: 18 mmol/L — ABNORMAL LOW (ref 22–32)
CO2: 18 mmol/L — ABNORMAL LOW (ref 22–32)
Calcium: 9 mg/dL (ref 8.9–10.3)
Calcium: 7.5 mg/dL — ABNORMAL LOW (ref 8.9–10.3)
Chloride: 103 mmol/L (ref 98–111)
Chloride: 97 mmol/L — ABNORMAL LOW (ref 98–111)
Creatinine, Ser: 1.3 mg/dL — ABNORMAL HIGH (ref 0.44–1.00)
Creatinine, Ser: 1.21 mg/dL — ABNORMAL HIGH (ref 0.44–1.00)
GFR, Estimated: 45 mL/min — ABNORMAL LOW (ref >60)
GFR, Estimated: 49 mL/min — ABNORMAL LOW (ref >60)
Glucose, Bld: 97 mg/dL (ref 70–99)
Glucose, Bld: 230 mg/dL — ABNORMAL HIGH (ref 70–99)
Potassium: 3.6 mmol/L (ref 3.5–5.1)
Potassium: 3.3 mmol/L — ABNORMAL LOW (ref 3.5–5.1)
Sodium: 137 mmol/L (ref 135–145)
Sodium: 131 mmol/L — ABNORMAL LOW (ref 135–145)

## 2021-05-02 LAB — POCT I-STAT 7, (LYTES, BLD GAS, ICA,H+H)
Acid-base deficit: 3 mmol/L — ABNORMAL HIGH (ref 0.0–2.0)
Acid-base deficit: 1 mmol/L (ref 0.0–2.0)
Acid-base deficit: 3 mmol/L — ABNORMAL HIGH (ref 0.0–2.0)
Acid-base deficit: 1 mmol/L (ref 0.0–2.0)
Acid-base deficit: 9 mmol/L — ABNORMAL HIGH (ref 0.0–2.0)
Bicarbonate: 22.6 mmol/L (ref 20.0–28.0)
Bicarbonate: 23.6 mmol/L (ref 20.0–28.0)
Bicarbonate: 22.6 mmol/L (ref 20.0–28.0)
Bicarbonate: 19.7 mmol/L — ABNORMAL LOW (ref 20.0–28.0)
Bicarbonate: 25 mmol/L (ref 20.0–28.0)
Calcium, Ion: 1.27 mmol/L (ref 1.15–1.40)
Calcium, Ion: 1.06 mmol/L — ABNORMAL LOW (ref 1.15–1.40)
Calcium, Ion: 1.21 mmol/L (ref 1.15–1.40)
Calcium, Ion: 1.07 mmol/L — ABNORMAL LOW (ref 1.15–1.40)
Calcium, Ion: 1.14 mmol/L — ABNORMAL LOW (ref 1.15–1.40)
HCT: 33 % — ABNORMAL LOW (ref 36.0–46.0)
HCT: 26 % — ABNORMAL LOW (ref 36.0–46.0)
HCT: 26 % — ABNORMAL LOW (ref 36.0–46.0)
HCT: 35 % — ABNORMAL LOW (ref 36.0–46.0)
HCT: 39 % (ref 36.0–46.0)
Hemoglobin: 11.2 g/dL — ABNORMAL LOW (ref 12.0–15.0)
Hemoglobin: 8.8 g/dL — ABNORMAL LOW (ref 12.0–15.0)
Hemoglobin: 8.8 g/dL — ABNORMAL LOW (ref 12.0–15.0)
Hemoglobin: 11.9 g/dL — ABNORMAL LOW (ref 12.0–15.0)
Hemoglobin: 13.3 g/dL (ref 12.0–15.0)
O2 Saturation: 100 %
O2 Saturation: 100 %
O2 Saturation: 100 %
O2 Saturation: 100 %
O2 Saturation: 99 %
Patient temperature: 36.7
Potassium: 3.3 mmol/L — ABNORMAL LOW (ref 3.5–5.1)
Potassium: 4.2 mmol/L (ref 3.5–5.1)
Potassium: 3.7 mmol/L (ref 3.5–5.1)
Potassium: 3.7 mmol/L (ref 3.5–5.1)
Potassium: 3.9 mmol/L (ref 3.5–5.1)
Sodium: 137 mmol/L (ref 135–145)
Sodium: 137 mmol/L (ref 135–145)
Sodium: 137 mmol/L (ref 135–145)
Sodium: 138 mmol/L (ref 135–145)
Sodium: 141 mmol/L (ref 135–145)
TCO2: 24 mmol/L (ref 22–32)
TCO2: 25 mmol/L (ref 22–32)
TCO2: 24 mmol/L (ref 22–32)
TCO2: 21 mmol/L — ABNORMAL LOW (ref 22–32)
TCO2: 26 mmol/L (ref 22–32)
pCO2 arterial: 44.1 mmHg (ref 32.0–48.0)
pCO2 arterial: 39.9 mmHg (ref 32.0–48.0)
pCO2 arterial: 42.4 mmHg (ref 32.0–48.0)
pCO2 arterial: 43.9 mmHg (ref 32.0–48.0)
pCO2 arterial: 49.8 mmHg — ABNORMAL HIGH (ref 32.0–48.0)
pH, Arterial: 7.318 — ABNORMAL LOW (ref 7.350–7.450)
pH, Arterial: 7.381 (ref 7.350–7.450)
pH, Arterial: 7.335 — ABNORMAL LOW (ref 7.350–7.450)
pH, Arterial: 7.203 — ABNORMAL LOW (ref 7.350–7.450)
pH, Arterial: 7.364 (ref 7.350–7.450)
pO2, Arterial: 432 mmHg — ABNORMAL HIGH (ref 83.0–108.0)
pO2, Arterial: 315 mmHg — ABNORMAL HIGH (ref 83.0–108.0)
pO2, Arterial: 384 mmHg — ABNORMAL HIGH (ref 83.0–108.0)
pO2, Arterial: 195 mmHg — ABNORMAL HIGH (ref 83.0–108.0)
pO2, Arterial: 209 mmHg — ABNORMAL HIGH (ref 83.0–108.0)

## 2021-05-02 LAB — CBC
HCT: 34.8 % — ABNORMAL LOW (ref 36.0–46.0)
HCT: 35.7 % — ABNORMAL LOW (ref 36.0–46.0)
HCT: 34.8 % — ABNORMAL LOW (ref 36.0–46.0)
Hemoglobin: 11.5 g/dL — ABNORMAL LOW (ref 12.0–15.0)
Hemoglobin: 11.5 g/dL — ABNORMAL LOW (ref 12.0–15.0)
Hemoglobin: 11.4 g/dL — ABNORMAL LOW (ref 12.0–15.0)
MCH: 27.5 pg (ref 26.0–34.0)
MCH: 28.2 pg (ref 26.0–34.0)
MCH: 28.3 pg (ref 26.0–34.0)
MCHC: 33 g/dL (ref 30.0–36.0)
MCHC: 32.2 g/dL (ref 30.0–36.0)
MCHC: 32.8 g/dL (ref 30.0–36.0)
MCV: 83.3 fL (ref 80.0–100.0)
MCV: 87.5 fL (ref 80.0–100.0)
MCV: 86.4 fL (ref 80.0–100.0)
Platelets: 313 10*3/uL (ref 150–400)
Platelets: 227 10*3/uL (ref 150–400)
Platelets: 244 10*3/uL (ref 150–400)
RBC: 4.18 MIL/uL (ref 3.87–5.11)
RBC: 4.08 MIL/uL (ref 3.87–5.11)
RBC: 4.03 MIL/uL (ref 3.87–5.11)
RDW: 15.2 % (ref 11.5–15.5)
RDW: 15.4 % (ref 11.5–15.5)
RDW: 15.3 % (ref 11.5–15.5)
WBC: 9.5 10*3/uL (ref 4.0–10.5)
WBC: 29.1 10*3/uL — ABNORMAL HIGH (ref 4.0–10.5)
WBC: 19.5 10*3/uL — ABNORMAL HIGH (ref 4.0–10.5)
nRBC: 0 % (ref 0.0–0.2)
nRBC: 0 % (ref 0.0–0.2)
nRBC: 0 % (ref 0.0–0.2)

## 2021-05-02 LAB — GLUCOSE, CAPILLARY
Glucose-Capillary: 182 mg/dL — ABNORMAL HIGH (ref 70–99)
Glucose-Capillary: 126 mg/dL — ABNORMAL HIGH (ref 70–99)
Glucose-Capillary: 114 mg/dL — ABNORMAL HIGH (ref 70–99)
Glucose-Capillary: 94 mg/dL (ref 70–99)
Glucose-Capillary: 101 mg/dL — ABNORMAL HIGH (ref 70–99)
Glucose-Capillary: 223 mg/dL — ABNORMAL HIGH (ref 70–99)

## 2021-05-02 LAB — PROTIME-INR
INR: 1.1 (ref 0.8–1.2)
INR: 1.5 — ABNORMAL HIGH (ref 0.8–1.2)
Prothrombin Time: 14.4 seconds (ref 11.4–15.2)
Prothrombin Time: 18.1 seconds — ABNORMAL HIGH (ref 11.4–15.2)

## 2021-05-02 LAB — MAGNESIUM: Magnesium: 2.9 mg/dL — ABNORMAL HIGH (ref 1.7–2.4)

## 2021-05-02 LAB — POCT I-STAT EG7
Acid-base deficit: 3 mmol/L — ABNORMAL HIGH (ref 0.0–2.0)
Bicarbonate: 22.9 mmol/L (ref 20.0–28.0)
Calcium, Ion: 1.1 mmol/L — ABNORMAL LOW (ref 1.15–1.40)
HCT: 26 % — ABNORMAL LOW (ref 36.0–46.0)
Hemoglobin: 8.8 g/dL — ABNORMAL LOW (ref 12.0–15.0)
O2 Saturation: 77 %
Potassium: 4 mmol/L (ref 3.5–5.1)
Sodium: 137 mmol/L (ref 135–145)
TCO2: 24 mmol/L (ref 22–32)
pCO2, Ven: 41.8 mmHg — ABNORMAL LOW (ref 44.0–60.0)
pH, Ven: 7.347 (ref 7.250–7.430)
pO2, Ven: 44 mmHg (ref 32.0–45.0)

## 2021-05-02 LAB — PREPARE RBC (CROSSMATCH)

## 2021-05-02 LAB — HEMOGLOBIN AND HEMATOCRIT, BLOOD
HCT: 23.9 % — ABNORMAL LOW (ref 36.0–46.0)
Hemoglobin: 7.6 g/dL — ABNORMAL LOW (ref 12.0–15.0)

## 2021-05-02 LAB — APTT
aPTT: 26 seconds (ref 24–36)
aPTT: 28 seconds (ref 24–36)

## 2021-05-02 LAB — HEMOGLOBIN A1C
Hgb A1c MFr Bld: 6.7 % — ABNORMAL HIGH (ref 4.8–5.6)
Mean Plasma Glucose: 145.59 mg/dL

## 2021-05-02 LAB — ECHO INTRAOPERATIVE TEE
Height: 63.5 in
Weight: 2814.4 oz

## 2021-05-02 LAB — PLATELET COUNT: Platelets: 214 10*3/uL (ref 150–400)

## 2021-05-02 SURGERY — CORONARY ARTERY BYPASS GRAFTING (CABG)
Anesthesia: General | Site: Chest

## 2021-05-02 MED ORDER — LACTATED RINGERS IV SOLN: INTRAVENOUS | Status: DC | PRN

## 2021-05-02 MED ORDER — ACETAMINOPHEN 650 MG RE SUPP
650.0000 mg | Freq: Once | RECTAL | Status: AC
Start: 2021-05-02 — End: 2021-05-02
  Administered 2021-05-02: 650 mg via RECTAL

## 2021-05-02 MED ORDER — FAMOTIDINE IN NACL 20-0.9 MG/50ML-% IV SOLN
20.0000 mg | Freq: Two times a day (BID) | INTRAVENOUS | Status: DC
  Administered 2021-05-02: 20 mg via INTRAVENOUS
  Filled 2021-05-02 (×2): qty 50

## 2021-05-02 MED ORDER — AMIODARONE HCL IN DEXTROSE 360-4.14 MG/200ML-% IV SOLN: 60.0000 mg/h | INTRAVENOUS | Status: AC

## 2021-05-02 MED ORDER — ACETAMINOPHEN 160 MG/5ML PO SOLN: 650.0000 mg | Freq: Once | ORAL | Status: AC

## 2021-05-02 MED ORDER — METOPROLOL TARTRATE 25 MG/10 ML ORAL SUSPENSION: 12.5000 mg | Freq: Two times a day (BID) | ORAL | Status: DC

## 2021-05-02 MED ORDER — MIDAZOLAM HCL 5 MG/5ML IJ SOLN
INTRAMUSCULAR | Status: DC | PRN
  Administered 2021-05-02: 2 mg via INTRAVENOUS
  Administered 2021-05-02 (×6): 1 mg via INTRAVENOUS
  Administered 2021-05-02: 2 mg via INTRAVENOUS

## 2021-05-02 MED ORDER — AMIODARONE IV BOLUS ONLY 150 MG/100ML
INTRAVENOUS | Status: DC | PRN
  Administered 2021-05-02: 150 mg via INTRAVENOUS

## 2021-05-02 MED ORDER — AMIODARONE HCL IN DEXTROSE 360-4.14 MG/200ML-% IV SOLN
INTRAVENOUS | Status: DC | PRN
  Administered 2021-05-02: 60 mg/h via INTRAVENOUS

## 2021-05-02 MED ORDER — MIDAZOLAM HCL (PF) 10 MG/2ML IJ SOLN
INTRAMUSCULAR | Status: AC
  Filled 2021-05-02: qty 2

## 2021-05-02 MED ORDER — CHLORHEXIDINE GLUCONATE CLOTH 2 % EX PADS
6.0000 | MEDICATED_PAD | Freq: Every day | CUTANEOUS | Status: DC
  Administered 2021-05-02 – 2021-05-07 (×6): 6 via TOPICAL

## 2021-05-02 MED ORDER — MORPHINE SULFATE (PF) 2 MG/ML IV SOLN
1.0000 mg | INTRAVENOUS | Status: DC | PRN
  Administered 2021-05-02 – 2021-05-03 (×6): 2 mg via INTRAVENOUS
  Filled 2021-05-02: qty 2
  Filled 2021-05-02 (×5): qty 1

## 2021-05-02 MED ORDER — ACETAMINOPHEN 160 MG/5ML PO SOLN: 1000.0000 mg | Freq: Four times a day (QID) | ORAL | Status: DC

## 2021-05-02 MED ORDER — SODIUM CHLORIDE 0.9% FLUSH: 3.0000 mL | INTRAVENOUS | Status: DC | PRN

## 2021-05-02 MED ORDER — SODIUM BICARBONATE 8.4 % IV SOLN
INTRAVENOUS | Status: DC | PRN
  Administered 2021-05-02 (×2): 50 meq via INTRAVENOUS

## 2021-05-02 MED ORDER — CEFAZOLIN SODIUM-DEXTROSE 2-4 GM/100ML-% IV SOLN
2.0000 g | Freq: Three times a day (TID) | INTRAVENOUS | Status: AC
  Administered 2021-05-02 – 2021-05-04 (×6): 2 g via INTRAVENOUS
  Filled 2021-05-02 (×7): qty 100

## 2021-05-02 MED ORDER — 0.9 % SODIUM CHLORIDE (POUR BTL) OPTIME
TOPICAL | Status: DC | PRN
  Administered 2021-05-02: 5000 mL

## 2021-05-02 MED ORDER — SODIUM CHLORIDE 0.9 % IV SOLN: INTRAVENOUS | Status: DC | PRN

## 2021-05-02 MED ORDER — FENTANYL CITRATE (PF) 250 MCG/5ML IJ SOLN
INTRAMUSCULAR | Status: DC | PRN
  Administered 2021-05-02: 100 ug via INTRAVENOUS
  Administered 2021-05-02 (×5): 50 ug via INTRAVENOUS
  Administered 2021-05-02: 150 ug via INTRAVENOUS
  Administered 2021-05-02: 100 ug via INTRAVENOUS
  Administered 2021-05-02: 50 ug via INTRAVENOUS
  Administered 2021-05-02: 100 ug via INTRAVENOUS
  Administered 2021-05-02: 50 ug via INTRAVENOUS

## 2021-05-02 MED ORDER — SODIUM CHLORIDE 0.9 % IV SOLN: INTRAVENOUS | Status: AC

## 2021-05-02 MED ORDER — OXYCODONE HCL 5 MG PO TABS
5.0000 mg | ORAL_TABLET | ORAL | Status: DC | PRN
  Administered 2021-05-02 – 2021-05-04 (×5): 5 mg via ORAL
  Filled 2021-05-02 (×5): qty 1

## 2021-05-02 MED ORDER — ARTIFICIAL TEARS OPHTHALMIC OINT
TOPICAL_OINTMENT | OPHTHALMIC | Status: DC | PRN
  Administered 2021-05-02: 1 via OPHTHALMIC

## 2021-05-02 MED ORDER — DEXMEDETOMIDINE HCL IN NACL 400 MCG/100ML IV SOLN
0.0000 ug/kg/h | INTRAVENOUS | Status: DC
  Administered 2021-05-02: 0.2 ug/kg/h via INTRAVENOUS
  Filled 2021-05-02 (×2): qty 100

## 2021-05-02 MED ORDER — FENTANYL CITRATE (PF) 250 MCG/5ML IJ SOLN
INTRAMUSCULAR | Status: AC
  Filled 2021-05-02: qty 25

## 2021-05-02 MED ORDER — MAGNESIUM SULFATE 4 GM/100ML IV SOLN
4.0000 g | Freq: Once | INTRAVENOUS | Status: AC
  Administered 2021-05-02: 4 g via INTRAVENOUS
  Filled 2021-05-02: qty 100

## 2021-05-02 MED ORDER — PROTAMINE SULFATE 10 MG/ML IV SOLN
INTRAVENOUS | Status: DC | PRN
  Administered 2021-05-02: 23 mg via INTRAVENOUS

## 2021-05-02 MED ORDER — VANCOMYCIN HCL 1000 MG IV SOLR
INTRAVENOUS | Status: DC | PRN
  Administered 2021-05-02: 1000 mL

## 2021-05-02 MED ORDER — SODIUM BICARBONATE 8.4 % IV SOLN
100.0000 meq | Freq: Once | INTRAVENOUS | Status: AC
  Administered 2021-05-02: 100 meq via INTRAVENOUS

## 2021-05-02 MED ORDER — HEPARIN SODIUM (PORCINE) 1000 UNIT/ML IJ SOLN
INTRAMUSCULAR | Status: DC | PRN
  Administered 2021-05-02: 25000 [IU] via INTRAVENOUS

## 2021-05-02 MED ORDER — DEXTROSE 50 % IV SOLN: 0.0000 mL | INTRAVENOUS | Status: DC | PRN

## 2021-05-02 MED ORDER — POTASSIUM CHLORIDE 10 MEQ/50ML IV SOLN
10.0000 meq | INTRAVENOUS | Status: AC
  Administered 2021-05-02 (×3): 10 meq via INTRAVENOUS

## 2021-05-02 MED ORDER — ASPIRIN 81 MG PO CHEW: 324.0000 mg | CHEWABLE_TABLET | Freq: Every day | ORAL | Status: DC

## 2021-05-02 MED ORDER — PROPOFOL 10 MG/ML IV BOLUS
INTRAVENOUS | Status: AC
  Filled 2021-05-02: qty 20

## 2021-05-02 MED ORDER — SODIUM CHLORIDE 0.45 % IV SOLN: INTRAVENOUS | Status: DC | PRN

## 2021-05-02 MED ORDER — SODIUM CHLORIDE 0.9 % IV SOLN: 250.0000 mL | INTRAVENOUS | Status: DC

## 2021-05-02 MED ORDER — NITROGLYCERIN IN D5W 200-5 MCG/ML-% IV SOLN: 0.0000 ug/min | INTRAVENOUS | Status: DC

## 2021-05-02 MED ORDER — SODIUM CHLORIDE 0.9% FLUSH: 10.0000 mL | INTRAVENOUS | Status: DC | PRN

## 2021-05-02 MED ORDER — ROCURONIUM BROMIDE 10 MG/ML (PF) SYRINGE
PREFILLED_SYRINGE | INTRAVENOUS | Status: AC
  Filled 2021-05-02: qty 10

## 2021-05-02 MED ORDER — LACTATED RINGERS IV SOLN: 500.0000 mL | Freq: Once | INTRAVENOUS | Status: DC | PRN

## 2021-05-02 MED ORDER — ACETAMINOPHEN 500 MG PO TABS
1000.0000 mg | ORAL_TABLET | Freq: Four times a day (QID) | ORAL | Status: AC
  Administered 2021-05-03 – 2021-05-07 (×17): 1000 mg via ORAL
  Filled 2021-05-02 (×17): qty 2

## 2021-05-02 MED ORDER — ALBUMIN HUMAN 5 % IV SOLN
250.0000 mL | INTRAVENOUS | Status: DC | PRN
  Administered 2021-05-02 (×2): 12.5 g via INTRAVENOUS

## 2021-05-02 MED ORDER — LACTATED RINGERS IV SOLN: INTRAVENOUS | Status: DC

## 2021-05-02 MED ORDER — PANTOPRAZOLE SODIUM 40 MG PO TBEC
40.0000 mg | DELAYED_RELEASE_TABLET | Freq: Every day | ORAL | Status: DC
  Administered 2021-05-04 – 2021-05-08 (×5): 40 mg via ORAL
  Filled 2021-05-02 (×5): qty 1

## 2021-05-02 MED ORDER — POTASSIUM CHLORIDE 10 MEQ/50ML IV SOLN
10.0000 meq | INTRAVENOUS | Status: AC
  Administered 2021-05-02 – 2021-05-03 (×3): 10 meq via INTRAVENOUS
  Filled 2021-05-02 (×3): qty 50

## 2021-05-02 MED ORDER — AMIODARONE HCL IN DEXTROSE 360-4.14 MG/200ML-% IV SOLN
INTRAVENOUS | Status: AC
  Administered 2021-05-02: 60 mg/h via INTRAVENOUS
  Filled 2021-05-02: qty 200

## 2021-05-02 MED ORDER — BISACODYL 5 MG PO TBEC
10.0000 mg | DELAYED_RELEASE_TABLET | Freq: Every day | ORAL | Status: DC
  Administered 2021-05-03 – 2021-05-08 (×4): 10 mg via ORAL
  Filled 2021-05-02 (×4): qty 2

## 2021-05-02 MED ORDER — ASPIRIN EC 325 MG PO TBEC
325.0000 mg | DELAYED_RELEASE_TABLET | Freq: Every day | ORAL | Status: DC
  Administered 2021-05-03 – 2021-05-08 (×6): 325 mg via ORAL
  Filled 2021-05-02 (×6): qty 1

## 2021-05-02 MED ORDER — PHENYLEPHRINE HCL (PRESSORS) 10 MG/ML IV SOLN
INTRAVENOUS | Status: DC | PRN
  Administered 2021-05-02 (×2): 80 ug via INTRAVENOUS
  Administered 2021-05-02: 40 ug via INTRAVENOUS
  Administered 2021-05-02: 80 ug via INTRAVENOUS

## 2021-05-02 MED ORDER — NOREPINEPHRINE 4 MG/250ML-% IV SOLN
0.0000 ug/min | INTRAVENOUS | Status: DC
  Administered 2021-05-02: 15 ug/min via INTRAVENOUS
  Administered 2021-05-03: 9 ug/min via INTRAVENOUS
  Administered 2021-05-03: 17 ug/min via INTRAVENOUS
  Administered 2021-05-03: 18 ug/min via INTRAVENOUS
  Filled 2021-05-02 (×4): qty 250
  Filled 2021-05-02: qty 500

## 2021-05-02 MED ORDER — CALCIUM CHLORIDE 10 % IV SOLN
INTRAVENOUS | Status: DC | PRN
  Administered 2021-05-02: 300 mg via INTRAVENOUS
  Administered 2021-05-02: 200 mg via INTRAVENOUS

## 2021-05-02 MED ORDER — ROCURONIUM BROMIDE 10 MG/ML (PF) SYRINGE
PREFILLED_SYRINGE | INTRAVENOUS | Status: DC | PRN
  Administered 2021-05-02: 20 mg via INTRAVENOUS
  Administered 2021-05-02: 30 mg via INTRAVENOUS
  Administered 2021-05-02: 10 mg via INTRAVENOUS
  Administered 2021-05-02: 60 mg via INTRAVENOUS
  Administered 2021-05-02: 40 mg via INTRAVENOUS

## 2021-05-02 MED ORDER — SODIUM CHLORIDE 0.9% FLUSH
3.0000 mL | Freq: Two times a day (BID) | INTRAVENOUS | Status: DC
  Administered 2021-05-03 – 2021-05-07 (×9): 3 mL via INTRAVENOUS

## 2021-05-02 MED ORDER — SODIUM CHLORIDE (PF) 0.9 % IJ SOLN
OROMUCOSAL | Status: DC | PRN
  Administered 2021-05-02 (×3): 4 mL via TOPICAL

## 2021-05-02 MED ORDER — SODIUM CHLORIDE 0.9% IV SOLUTION: Freq: Once | INTRAVENOUS | Status: DC

## 2021-05-02 MED ORDER — METOPROLOL TARTRATE 12.5 MG HALF TABLET
12.5000 mg | ORAL_TABLET | Freq: Two times a day (BID) | ORAL | Status: DC
  Administered 2021-05-03 – 2021-05-05 (×4): 12.5 mg via ORAL
  Filled 2021-05-02 (×5): qty 1

## 2021-05-02 MED ORDER — SODIUM CHLORIDE 0.9 % IV SOLN: INTRAVENOUS | Status: DC

## 2021-05-02 MED ORDER — VANCOMYCIN HCL IN DEXTROSE 1-5 GM/200ML-% IV SOLN
1000.0000 mg | Freq: Once | INTRAVENOUS | Status: AC
  Administered 2021-05-02: 1000 mg via INTRAVENOUS
  Filled 2021-05-02: qty 200

## 2021-05-02 MED ORDER — PHENYLEPHRINE HCL-NACL 20-0.9 MG/250ML-% IV SOLN: 0.0000 ug/min | INTRAVENOUS | Status: DC

## 2021-05-02 MED ORDER — INSULIN REGULAR(HUMAN) IN NACL 100-0.9 UT/100ML-% IV SOLN
INTRAVENOUS | Status: DC
  Administered 2021-05-03: 2.8 [IU]/h via INTRAVENOUS
  Filled 2021-05-02: qty 100

## 2021-05-02 MED ORDER — TRAMADOL HCL 50 MG PO TABS
50.0000 mg | ORAL_TABLET | ORAL | Status: DC | PRN
  Administered 2021-05-04: 100 mg via ORAL
  Filled 2021-05-02: qty 2

## 2021-05-02 MED ORDER — ONDANSETRON HCL 4 MG/2ML IJ SOLN
4.0000 mg | Freq: Four times a day (QID) | INTRAMUSCULAR | Status: DC | PRN
  Administered 2021-05-03 (×2): 4 mg via INTRAVENOUS
  Filled 2021-05-02 (×2): qty 2

## 2021-05-02 MED ORDER — AMIODARONE HCL IN DEXTROSE 360-4.14 MG/200ML-% IV SOLN
30.0000 mg/h | INTRAVENOUS | Status: DC
  Administered 2021-05-02 – 2021-05-03 (×3): 30 mg/h via INTRAVENOUS
  Filled 2021-05-02: qty 200

## 2021-05-02 MED ORDER — MIDAZOLAM HCL 2 MG/2ML IJ SOLN: 2.0000 mg | INTRAMUSCULAR | Status: DC | PRN

## 2021-05-02 MED ORDER — DOCUSATE SODIUM 100 MG PO CAPS
200.0000 mg | ORAL_CAPSULE | Freq: Every day | ORAL | Status: DC
  Administered 2021-05-04 – 2021-05-08 (×3): 200 mg via ORAL
  Filled 2021-05-02 (×5): qty 2

## 2021-05-02 MED ORDER — BISACODYL 10 MG RE SUPP
10.0000 mg | Freq: Every day | RECTAL | Status: DC
  Filled 2021-05-02: qty 1

## 2021-05-02 MED ORDER — ALBUMIN HUMAN 5 % IV SOLN: INTRAVENOUS | Status: DC | PRN

## 2021-05-02 MED ORDER — PLASMA-LYTE 148 IV SOLN
INTRAVENOUS | Status: DC | PRN
  Administered 2021-05-02: 500 mL via INTRAVASCULAR

## 2021-05-02 MED ORDER — ARTIFICIAL TEARS OPHTHALMIC OINT
TOPICAL_OINTMENT | OPHTHALMIC | Status: AC
  Filled 2021-05-02: qty 3.5

## 2021-05-02 MED ORDER — CHLORHEXIDINE GLUCONATE 0.12 % MT SOLN
15.0000 mL | OROMUCOSAL | Status: AC
  Administered 2021-05-02: 15 mL via OROMUCOSAL

## 2021-05-02 MED ORDER — SODIUM CHLORIDE 0.9% FLUSH
10.0000 mL | Freq: Two times a day (BID) | INTRAVENOUS | Status: DC
Start: 2021-05-02 — End: 2021-05-08
  Administered 2021-05-02: 10 mL
  Administered 2021-05-02: 40 mL
  Administered 2021-05-03 – 2021-05-07 (×5): 10 mL

## 2021-05-02 MED ORDER — PROPOFOL 10 MG/ML IV BOLUS
INTRAVENOUS | Status: DC | PRN
  Administered 2021-05-02: 100 mg via INTRAVENOUS

## 2021-05-02 MED ORDER — METOPROLOL TARTRATE 5 MG/5ML IV SOLN
2.5000 mg | INTRAVENOUS | Status: DC | PRN
Start: 2021-05-02 — End: 2021-05-08
  Administered 2021-05-05: 2.5 mg via INTRAVENOUS
  Filled 2021-05-02: qty 5

## 2021-05-02 MED ORDER — MILRINONE LACTATE IN DEXTROSE 20-5 MG/100ML-% IV SOLN
0.1250 ug/kg/min | INTRAVENOUS | Status: DC
  Administered 2021-05-02: 0.25 ug/kg/min via INTRAVENOUS
  Filled 2021-05-02: qty 100

## 2021-05-02 SURGICAL SUPPLY — 99 items
ADAPTER CARDIO PERF ANTE/RETRO (ADAPTER) ×3 IMPLANT
BAG DECANTER FOR FLEXI CONT (MISCELLANEOUS) ×6 IMPLANT
BLADE CLIPPER SURG (BLADE) ×3 IMPLANT
BLADE STERNUM SYSTEM 6 (BLADE) ×3 IMPLANT
BNDG ELASTIC 4X5.8 VLCR STR LF (GAUZE/BANDAGES/DRESSINGS) ×3 IMPLANT
BNDG ELASTIC 6X5.8 VLCR STR LF (GAUZE/BANDAGES/DRESSINGS) ×3 IMPLANT
BNDG GAUZE ELAST 4 BULKY (GAUZE/BANDAGES/DRESSINGS) ×3 IMPLANT
CANISTER SUCT 3000ML PPV (MISCELLANEOUS) ×3 IMPLANT
CANNULA EZ GLIDE AORTIC 21FR (CANNULA) ×3 IMPLANT
CANNULA GUNDRY RCSP 15FR (MISCELLANEOUS) ×3 IMPLANT
CATH CPB KIT OWEN (MISCELLANEOUS) ×3 IMPLANT
CATH THORACIC 36FR (CATHETERS) ×3 IMPLANT
CLIP RETRACTION 3.0MM CORONARY (MISCELLANEOUS) ×3 IMPLANT
CLIP VESOCCLUDE MED 24/CT (CLIP) IMPLANT
CLIP VESOCCLUDE SM WIDE 24/CT (CLIP) IMPLANT
CONN ST 1/4X3/8  BEN (MISCELLANEOUS) ×3
CONN ST 1/4X3/8 BEN (MISCELLANEOUS) ×2 IMPLANT
CONTAINER PROTECT SURGISLUSH (MISCELLANEOUS) ×6 IMPLANT
DERMABOND ADHESIVE PROPEN (GAUZE/BANDAGES/DRESSINGS) ×1
DERMABOND ADVANCED .7 DNX6 (GAUZE/BANDAGES/DRESSINGS) ×2 IMPLANT
DRAIN CHANNEL 32F RND 10.7 FF (WOUND CARE) ×6 IMPLANT
DRAPE CARDIOVASCULAR INCISE (DRAPES) ×3
DRAPE INCISE IOBAN 66X45 STRL (DRAPES) ×3 IMPLANT
DRAPE SRG 135X102X78XABS (DRAPES) ×2 IMPLANT
DRAPE WARM FLUID 44X44 (DRAPES) ×3 IMPLANT
DRSG AQUACEL AG ADV 3.5X14 (GAUZE/BANDAGES/DRESSINGS) ×3 IMPLANT
ELECT BLADE 4.0 EZ CLEAN MEGAD (MISCELLANEOUS) ×3
ELECT REM PT RETURN 9FT ADLT (ELECTROSURGICAL) ×6
ELECTRODE BLDE 4.0 EZ CLN MEGD (MISCELLANEOUS) ×2 IMPLANT
ELECTRODE REM PT RTRN 9FT ADLT (ELECTROSURGICAL) ×4 IMPLANT
FELT TEFLON 1X6 (MISCELLANEOUS) ×3 IMPLANT
FIBERTAPE STERNAL CLSR 2 36IN (SUTURE) ×12 IMPLANT
FIBERTAPE STERNAL CLSR 2X36 (SUTURE) ×12 IMPLANT
GAUZE SPONGE 4X4 12PLY STRL (GAUZE/BANDAGES/DRESSINGS) ×6 IMPLANT
GLOVE ORTHO TXT STRL SZ7.5 (GLOVE) ×6 IMPLANT
GLOVE SURG MICRO LTX SZ6.5 (GLOVE) ×15 IMPLANT
GLOVE SURG MICRO LTX SZ7.5 (GLOVE) ×9 IMPLANT
GLOVE SURG MICRO LTX SZ8 (GLOVE) ×3 IMPLANT
GOWN STRL REUS W/ TWL LRG LVL3 (GOWN DISPOSABLE) ×14 IMPLANT
GOWN STRL REUS W/TWL LRG LVL3 (GOWN DISPOSABLE) ×21
HEMOSTAT POWDER SURGIFOAM 1G (HEMOSTASIS) ×9 IMPLANT
INSERT FOGARTY XLG (MISCELLANEOUS) ×3 IMPLANT
KIT BASIN OR (CUSTOM PROCEDURE TRAY) ×3 IMPLANT
KIT SUCTION CATH 14FR (SUCTIONS) ×9 IMPLANT
KIT TURNOVER KIT B (KITS) ×3 IMPLANT
KIT VASOVIEW HEMOPRO 2 VH 4000 (KITS) ×3 IMPLANT
LEAD PACING MYOCARDI (MISCELLANEOUS) ×3 IMPLANT
MARKER GRAFT CORONARY BYPASS (MISCELLANEOUS) ×9 IMPLANT
NDL SUT PASSING CERCLAGE MED (SUTURE) ×6
NEEDLE SUT PASSING CERCLAG MED (SUTURE) ×4 IMPLANT
NS IRRIG 1000ML POUR BTL (IV SOLUTION) ×15 IMPLANT
PACK E OPEN HEART (SUTURE) ×3 IMPLANT
PACK OPEN HEART (CUSTOM PROCEDURE TRAY) ×3 IMPLANT
PAD ARMBOARD 7.5X6 YLW CONV (MISCELLANEOUS) ×6 IMPLANT
PAD ELECT DEFIB RADIOL ZOLL (MISCELLANEOUS) ×3 IMPLANT
PENCIL BUTTON HOLSTER BLD 10FT (ELECTRODE) ×3 IMPLANT
POSITIONER HEAD DONUT 9IN (MISCELLANEOUS) ×3 IMPLANT
PUNCH AORTIC ROTATE 4.0MM (MISCELLANEOUS) ×3 IMPLANT
SET CARDIOPLEGIA MPS 5001102 (MISCELLANEOUS) ×3 IMPLANT
SPONGE LAP 18X18 RF (DISPOSABLE) IMPLANT
SPONGE LAP 4X18 RFD (DISPOSABLE) ×3 IMPLANT
SUPPORT HEART JANKE-BARRON (MISCELLANEOUS) ×3 IMPLANT
SUT BONE WAX W31G (SUTURE) ×3 IMPLANT
SUT ETHIBOND X763 2 0 SH 1 (SUTURE) ×9 IMPLANT
SUT MNCRL AB 3-0 PS2 18 (SUTURE) ×9 IMPLANT
SUT MNCRL AB 4-0 PS2 18 (SUTURE) IMPLANT
SUT PDS AB 1 CTX 36 (SUTURE) ×6 IMPLANT
SUT PROLENE 2 0 SH DA (SUTURE) IMPLANT
SUT PROLENE 3 0 SH DA (SUTURE) ×3 IMPLANT
SUT PROLENE 3 0 SH1 36 (SUTURE) ×3 IMPLANT
SUT PROLENE 4 0 RB 1 (SUTURE) ×15
SUT PROLENE 4 0 SH DA (SUTURE) IMPLANT
SUT PROLENE 4-0 RB1 .5 CRCL 36 (SUTURE) ×10 IMPLANT
SUT PROLENE 5 0 C 1 36 (SUTURE) IMPLANT
SUT PROLENE 6 0 C 1 30 (SUTURE) ×3 IMPLANT
SUT PROLENE 7.0 RB 3 (SUTURE) ×15 IMPLANT
SUT PROLENE 8 0 BV175 6 (SUTURE) ×3 IMPLANT
SUT PROLENE BLUE 7 0 (SUTURE) ×3 IMPLANT
SUT PROLENE POLY MONO (SUTURE) ×3 IMPLANT
SUT SILK  1 MH (SUTURE) ×3
SUT SILK 1 MH (SUTURE) ×2 IMPLANT
SUT VIC AB 1 CTX 36 (SUTURE) ×3
SUT VIC AB 1 CTX36XBRD ANBCTR (SUTURE) ×2 IMPLANT
SUT VIC AB 2-0 CT1 27 (SUTURE)
SUT VIC AB 2-0 CT1 TAPERPNT 27 (SUTURE) IMPLANT
SUT VIC AB 2-0 CTX 27 (SUTURE) IMPLANT
SUT VIC AB 3-0 SH 27 (SUTURE)
SUT VIC AB 3-0 SH 27X BRD (SUTURE) IMPLANT
SUT VIC AB 3-0 X1 27 (SUTURE) ×3 IMPLANT
SUT VICRYL 4-0 PS2 18IN ABS (SUTURE) IMPLANT
SYSTEM SAHARA CHEST DRAIN ATS (WOUND CARE) ×3 IMPLANT
TAPE CLOTH SURG 4X10 WHT LF (GAUZE/BANDAGES/DRESSINGS) ×3 IMPLANT
TAPE PAPER 2X10 WHT MICROPORE (GAUZE/BANDAGES/DRESSINGS) ×3 IMPLANT
TOWEL GREEN STERILE (TOWEL DISPOSABLE) ×3 IMPLANT
TOWEL GREEN STERILE FF (TOWEL DISPOSABLE) ×3 IMPLANT
TRAY FOLEY SLVR 16FR TEMP STAT (SET/KITS/TRAYS/PACK) ×3 IMPLANT
TUBING LAP HI FLOW INSUFFLATIO (TUBING) ×3 IMPLANT
UNDERPAD 30X36 HEAVY ABSORB (UNDERPADS AND DIAPERS) ×3 IMPLANT
WATER STERILE IRR 1000ML POUR (IV SOLUTION) ×6 IMPLANT

## 2021-05-02 NOTE — Procedures (Signed)
Extubation Procedure Note  Patient Details:   Name: Crystal Fox DOB: Jul 04, 1951 MRN: [id number]   Airway Documentation:    Vent end date: 05/02/21 Vent end time: 1630   Evaluation  O2 sats: stable throughout Complications: No apparent complications Patient did tolerate procedure well. Bilateral Breath Sounds: Clear,Diminished   Yes   RT extubated patient to 4L [place] per MD order/rapid wean protocol with RN at bedside. Positive cuff leak noted. VC 680 and NIF -28. Patient tolerated well and no stridor noted at this time. RT will continue to monitor as needed.   Fabiola Backer 05/02/2021, 5:10 PM

## 2021-05-02 NOTE — Progress Notes (Signed)
      [address]       [place] [address]             [phone]     CARDIOTHORACIC SURGERY PROGRESS NOTE  Subjective: Crystal Fox has been scheduled for CORONARY ARTERY BYPASS GRAFTING today.   Objective: Vital signs in last 24 hours: Temp:  [97.9 F (36.6 C)-98.9 F (37.2 C)] 98.2 F (36.8 C) (05/06 0423) Pulse Rate:  [59-95] 95 (05/05 2102) Cardiac Rhythm: Normal sinus rhythm (05/06 0239) Resp:  [8-27] 15 (05/06 0423) BP: (110-153)/(49-89) 112/49 (05/06 0423) SpO2:  [96 %-100 %] 99 % (05/06 0423) Weight:  [79.8 kg] 79.8 kg (05/06 0419)  Physical Exam: Unchanged from previously   Intake/Output from previous day: 05/05 0701 - 05/06 0700 In: 805.1 [P.O.:512; I.V.:293.1] Out: 1300 [Urine:1300] Intake/Output this shift: Total I/O In: 512 [P.O.:512] Out: 800 [Urine:800]  Lab Results: Recent Labs    04/29/21 1251 05/02/21 0404  WBC 10.4 9.5  HGB 12.8 11.5*  HCT 39.7 34.8*  PLT 385 313   BMET:  Recent Labs    04/29/21 1251 05/02/21 0404  NA 137 137  K 5.0 3.6  CL 98 103  CO2 19* 18*  GLUCOSE 183* 97  BUN 26 22  CREATININE 1.16* 1.30*  CALCIUM 10.4* 9.0    CBG (last 3)  Recent Labs    05/01/21 1344 05/01/21 1420 05/01/21 2205  GLUCAP 61* 94 130*   PT/INR:   Recent Labs    05/02/21 0404  LABPROT 14.4  INR 1.1    Assessment/Plan:   The various methods of treatment have been discussed with the patient. After consideration of the risks, benefits and treatment options the patient has consented to the planned procedure.   The patient has been seen and labs reviewed. There are no changes in the patient's condition to prevent proceeding with the planned procedure today.   Rexene Alberts, MD 05/02/2021 5:32 AM

## 2021-05-02 NOTE — Anesthesia Postprocedure Evaluation (Signed)
Anesthesia Post Note  Patient: Crystal Fox  Procedure(s) Performed: CORONARY ARTERY BYPASS GRAFTING (CABG) X THREE , USING LEFT INTERNAL MAMMARY ARTERY AND RIGHT GREATER SAPHENOUS VEIN HARVESTED ENDOSCOPICALLY (N/A Chest) TRANSESOPHAGEAL ECHOCARDIOGRAM (TEE) (N/A )     Patient location during evaluation: SICU Anesthesia Type: General Level of consciousness: awake, oriented and patient cooperative Pain management: pain level controlled Vital Signs Assessment: post-procedure vital signs reviewed and stable Respiratory status: spontaneous breathing, nonlabored ventilation, respiratory function stable and patient connected to nasal cannula oxygen Cardiovascular status: blood pressure returned to baseline and stable Postop Assessment: no apparent nausea or vomiting Anesthetic complications: no   No complications documented.  Last Vitals:  Vitals:   05/02/21 1900 05/02/21 2000  BP: (!) 104/50 (!) 115/56  Pulse: 89 89  Resp: 16 15  Temp: (!) 36.1 C (!) 36.3 C  SpO2: 99% 99%    Last Pain:  Vitals:   05/02/21 2000  TempSrc:   PainSc: 6                  Treasure Ochs COKER

## 2021-05-02 NOTE — Hospital Course (Addendum)
History of Present Illness:   Patient is a 70 year old female recently seen in cardiology evaluation for complaints of palpitations with work-up notable for  cardiomyopathy of uncertain etiology.  She underwent CT scan and calcium score was noted to be significantly elevated at 1085.  Additionally she underwent an echocardiogram in April 2022 which showed reduced ejection fraction at 30 to 35% with akinesis of the mid distal anteroseptal wall and apex, grade 1 diastolic dysfunction, mild mitral and tricuspid regurgitation.  She began to have symptoms last December, specifically palpitations.  Recently she has been asymptomatic and denies chest pain, palpitations, lower extremity edema, PND, or syncope.  She does have significant cardiac risk factors including diabetes, hyperlipidemia and hypertension.  She is a lifelong non-smoker.  She does have a history of cardiac disease in her family with father deceased from myocardial infarction.  Due to her recent symptoms and above findings it was felt that she should undergo diagnostic cardiac catheterization which first performed on today's date.  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 Due to multivessel disease as well as history of diabetes cardiology request consultation with CT surgery for consideration of coronary artery surgical revascularization.  She was evaluated by Dr. Roxy Manns who was in agreement the patient would benefit from coronary bypass grafting procedure.  The risks and benefits of the procedure were explained to the patient and she was agreeable to proceed.  Hospital Course:  Patient was taken to the operating room on 05/02/2021.  She underwent CABG x 3 utilizing LIMA to LAD, SVG to Ramus Intermediate, and SVG to PDA.  She also underwent endoscopic harvest of greater saphenous vein from her right leg.  She tolerated the procedure without difficulty and was taken to the SICU in stable  condition. She was extubated the evening of surgery.  During her stay in the SICU the patient was weaned off Milrinone and Levophed as hemodynamics allowed. She experienced Atrial Fibrillation but this did not recur post op.  Her chest tubes and arterial lines were removed without difficulty.  She was medically stable for transfer to the progressive care unit on 05/04/2021.  She developed Atrial Fibrillation with RVR.  She was treated with Amiodarone drip and converted without difficulty.  She remained in NSR.  Her pacing wires were removed without difficulty.  She remained volume overloaded and was aggressively diuresed with Lasix.  Her blood pressure remained labile and she was unable to be restarted on her home Entresto.  She developed a low magnesium level and was supplemented with Mag Sulfate via IV.  She was ambulating without difficulty.  Her surgical incisions are healing without evidence of infection.  She is medically stable for discharge home today.

## 2021-05-02 NOTE — Transfer of Care (Signed)
Immediate Anesthesia Transfer of Care Note  Patient: ERANDY MCEACHERN  Procedure(s) Performed: CORONARY ARTERY BYPASS GRAFTING (CABG) X THREE , USING LEFT INTERNAL MAMMARY ARTERY AND RIGHT GREATER SAPHENOUS VEIN HARVESTED ENDOSCOPICALLY (N/A Chest) TRANSESOPHAGEAL ECHOCARDIOGRAM (TEE) (N/A )  Patient Location: ICU  Anesthesia Type:General  Level of Consciousness: Patient remains intubated per anesthesia plan  Airway & Oxygen Therapy: Patient placed on Ventilator (see vital sign flow sheet for setting)  Post-op Assessment: Report given to RN and Post -op Vital signs reviewed and stable  Post vital signs: Reviewed  Last Vitals:  Vitals Value Taken Time  BP 108/48 05/02/21 1240  Temp 35.9 C 05/02/21 1249  Pulse 74 05/02/21 1249  Resp 23 05/02/21 1249  SpO2 100 % 05/02/21 1249  Vitals shown include unvalidated device data.  Last Pain:  Vitals:   05/02/21 0423  TempSrc: Oral  PainSc: 0-No pain         Complications: No complications documented.

## 2021-05-02 NOTE — Anesthesia Procedure Notes (Signed)
Central Venous Catheter Insertion Performed by: Roberts Gaudy, MD, anesthesiologist Start/End5/05/2021 6:55 AM, 05/02/2021 7:05 AM Patient location: Pre-op. Preanesthetic checklist: patient identified, IV checked, site marked, risks and benefits discussed, surgical consent, monitors and equipment checked, pre-op evaluation, timeout performed and anesthesia consent Lidocaine 1% used for infiltration and patient sedated Hand hygiene performed  and maximum sterile barriers used  Catheter size: 9 Fr Sheath introducer Procedure performed using ultrasound guided technique. Ultrasound Notes:anatomy identified, needle tip was noted to be adjacent to the nerve/plexus identified, no ultrasound evidence of intravascular and/or intraneural injection and image(s) printed for medical record Attempts: 1 Following insertion, line sutured and dressing applied. Post procedure assessment: blood return through all ports, free fluid flow and no air  Patient tolerated the procedure well with no immediate complications.

## 2021-05-02 NOTE — Brief Op Note (Signed)
05/01/2021 - 05/02/2021  11:07 AM  PATIENT:  Crystal Fox  70 y.o. female  PRE-OPERATIVE DIAGNOSIS:  CORONARY ARTERY DISEASE  POST-OPERATIVE DIAGNOSIS:  CORONARY ARTERY DISEASE  PROCEDURE:  Procedure(s): CORONARY ARTERY BYPASS GRAFTING (CABG) X THREE , USING LEFT INTERNAL MAMMARY ARTERY AND RIGHT GREATER SAPHENOUS VEIN HARVESTED ENDOSCOPICALLY (N/A) TRANSESOPHAGEAL ECHOCARDIOGRAM (TEE) (N/A) LIMA-LAD SVG-INTERM SVG-PD   EVH 45 MIN  SURGEON:  Surgeon(s) and Role:    * Rexene Alberts, MD - Primary  PHYSICIAN ASSISTANT: Dylen Mcelhannon PA-C  ANESTHESIA:   general  EBL:  491 mL   BLOOD ADMINISTERED:none  DRAINS: LEFT PLEURAL AND MEDIASTINAL CHEST TUBES   LOCAL MEDICATIONS USED:  NONE  SPECIMEN:  No Specimen  DISPOSITION OF SPECIMEN:  N/A  COUNTS:  YES  TOURNIQUET:  * No tourniquets in log *  DICTATION: .Dragon Dictation  PLAN OF CARE: Admit to inpatient   PATIENT DISPOSITION:  ICU - intubated and hemodynamically stable.   Delay start of Pharmacological VTE agent (>24hrs) due to surgical blood loss or risk of bleeding: yes  COMPLICATIONS: NO KNOWN

## 2021-05-02 NOTE — Anesthesia Procedure Notes (Signed)
Arterial Line Insertion Start/End5/05/2021 7:10 AM, 05/02/2021 7:15 AM Performed by: Roberts Gaudy, MD  Patient location: Pre-op. Preanesthetic checklist: patient identified, IV checked, site marked, risks and benefits discussed, surgical consent, monitors and equipment checked, pre-op evaluation and timeout performed Left, brachial was placed Catheter size: 20 G  Attempts: 1 Procedure performed without using ultrasound guided technique. Following insertion, dressing applied and Biopatch. Patient tolerated the procedure well with no immediate complications.

## 2021-05-02 NOTE — Anesthesia Procedure Notes (Signed)
Central Venous Catheter Insertion Performed by: Roberts Gaudy, MD, anesthesiologist Start/End5/05/2021 6:55 AM, 05/02/2021 7:05 AM Patient location: Pre-op. Preanesthetic checklist: patient identified, IV checked, site marked, risks and benefits discussed, surgical consent, monitors and equipment checked, pre-op evaluation, timeout performed and anesthesia consent Hand hygiene performed  and maximum sterile barriers used  PA cath was placed.Swan type:thermodilution Procedure performed without using ultrasound guided technique. Attempts: 1 Following insertion, line sutured, dressing applied and Biopatch. Post procedure assessment: blood return through all ports, free fluid flow and no air  Patient tolerated the procedure well with no immediate complications.

## 2021-05-02 NOTE — Progress Notes (Signed)
T[facility] BRIEF SICU PROGRESS NOTE  Day of Surgery  S/P Procedure(s) (LRB): CORONARY ARTERY BYPASS GRAFTING (CABG) X THREE , USING LEFT INTERNAL MAMMARY ARTERY AND RIGHT GREATER SAPHENOUS VEIN HARVESTED ENDOSCOPICALLY (N/A) TRANSESOPHAGEAL ECHOCARDIOGRAM (TEE) (N/A)   No complaints.  Mild soreness in chest NSR w/ stable hemodynamics although vasodilated w/ high cardiac output on levophed for BP support Breathing comfortably on nasal cannula Chest tube output low UOP adequate Labs okay  Plan: Continue routine early postop.  [place], MD 05/02/2021 6:44 PM

## 2021-05-02 NOTE — Progress Notes (Signed)
Rt tried x2 to obtain abg sample unsuccessful.

## 2021-05-02 NOTE — Op Note (Signed)
CARDIOTHORACIC SURGERY OPERATIVE NOTE  Date of Procedure: 05/02/2021  Preoperative Diagnosis: Severe 3-vessel Coronary Artery Disease  Postoperative Diagnosis: Same  Procedure:    Coronary Artery Bypass Grafting x 3   Left Internal Mammary Artery to Distal Left Anterior Descending Coronary Artery  Saphenous Vein Graft to Posterior Descending Coronary Artery  Saphenous Vein Graft to Ramus Intermediate Branch Coronary Artery  Endoscopic Vein Harvest from Right Thigh and Lower Leg  Surgeon: Valentina Gu. 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  She presented to her primary care physician several months ago with atypical symptoms of palpitations and was referred to Dr. Stanford Breed for cardiology consultation.  Gated noncontrast cardiac CT scan revealed elevated calcium score and echocardiogram revealed decreased left ventricular function with ejection fraction estimated only 30 to 35% with akinesis of the mid distal anteroseptal wall and apex.  She underwent elective diagnostic cardiac catheterization today which revealed severe multivessel coronary artery disease including proximal occlusion of the left anterior descending coronary artery and high-grade stenosis of the right coronary artery.  I have personally reviewed the patient's history, physical exam, echocardiogram, and  diagnostic cardiac catheterization.  I agree the patient would benefit from surgical revascularization in terms of decreased risk of future myocardial events and prolonged survival.  The patient has been seen in consultation and counseled at length regarding the indications, risks and potential benefits of surgery.  All questions have been answered, and the patient provides full informed consent for the operation as described.    DETAILS OF THE OPERATIVE PROCEDURE  Preparation:  The patient is brought to the operating room on the above mentioned date and central monitoring was established by the anesthesia team including placement of Swan-Ganz catheter and radial arterial line. The patient is placed in the supine position on the operating table.  Intravenous antibiotics are administered. General endotracheal anesthesia is induced uneventfully. A Foley catheter is placed.  Baseline transesophageal echocardiogram was performed.  Findings were notable for severe hypokinesis or akinesis involving the anterior wall and septum.  The remainder of the left ventricle and the entire right ventricle looked normal  The patient's chest, abdomen, both groins, and both lower extremities are prepared and draped in a sterile manner. A time out procedure is performed.   Surgical Approach and Conduit Harvest:  A median sternotomy incision was performed and the left internal mammary artery is dissected from the chest wall and prepared for bypass grafting. The left internal mammary artery is notably good quality conduit. Simultaneously, the greater saphenous vein is obtained from the patient's right thigh using endoscopic vein harvest technique. The saphenous vein is notably good quality conduit. After removal of the saphenous vein, the small surgical incisions in the lower extremity are closed with absorbable suture. Following systemic heparinization, the left internal mammary artery was transected distally noted to  have excellent flow.   Extracorporeal Cardiopulmonary Bypass and Myocardial Protection:  The pericardium is opened. The ascending aorta is normal in appearance. The  ascending aorta and the right atrium are cannulated for cardiopulmonary bypass.  Adequate heparinization is verified.   A retrograde cardioplegia cannula is placed through the right atrium into the coronary sinus.  The entire pre-bypass portion of the operation was notable for stable hemodynamics.  Cardiopulmonary bypass was begun and the surface of the heart is inspected. Distal target vessels are selected for coronary artery bypass grafting. A cardioplegia cannula is placed in the ascending aorta.  A temperature probe was placed in the interventricular septum.  The patient is allowed to cool passively to [facility] systemic temperature.  The aortic cross clamp is applied and cold blood cardioplegia is delivered initially in an antegrade fashion through the aortic root.   Supplemental cardioplegia is given retrograde through the coronary sinus catheter.  Iced saline slush is applied for topical hypothermia.  The initial cardioplegic arrest is rapid with early diastolic arrest.  Repeat doses of cardioplegia are administered intermittently throughout the entire cross clamp portion of the operation through the aortic root, through the coronary sinus catheter, and through subsequently placed vein grafts in order to maintain completely flat electrocardiogram and septal myocardial temperature below 15C.  Myocardial protection was felt to be excellent.   Coronary Artery Bypass Grafting:   The posterior descending branch of the right coronary artery was grafted using a reversed saphenous vein graft in an end-to-side fashion.  At the site of distal anastomosis the target vessel was good quality and measured approximately 1.8 mm in diameter.  The ramus intermediate branch coronary artery was grafted using a reversed saphenous vein graft in an  end-to-side fashion.  At the site of distal anastomosis the target vessel was good quality and measured approximately 1.7 mm in diameter.  The distal left anterior coronary artery was grafted with the left internal mammary artery in an end-to-side fashion.  At the site of distal anastomosis the target vessel was good quality and measured approximately 2.2 mm in diameter.  All proximal vein graft anastomoses were placed directly to the ascending aorta prior to removal of the aortic cross clamp.  The septal myocardial temperature rose rapidly after reperfusion of the left internal mammary artery graft.  The aortic cross clamp was removed after a total cross clamp time of 62 minutes.   Procedure Completion:  All proximal and distal coronary anastomoses were inspected for hemostasis and appropriate graft orientation. Epicardial pacing wires are fixed to the right ventricular outflow tract and to the right atrial appendage. The patient is rewarmed to 37C temperature. The patient is weaned and disconnected from cardiopulmonary bypass.  The patient's rhythm at separation from bypass was sinus.  The patient was weaned from cardiopulmonary bypass on low dose milrinone infusion. Total cardiopulmonary bypass time for the operation was 80 minutes.  Followup transesophageal echocardiogram performed after separation from bypass revealed no changes from the preoperative exam.  The aortic and venous cannula were removed uneventfully. Protamine was administered to reverse the anticoagulation. The mediastinum and pleural space were inspected for hemostasis and irrigated with saline solution. The mediastinum and the left pleural space were drained using 3 chest tubes placed through separate stab incisions inferiorly.  The soft tissues anterior to the aorta were reapproximated loosely. The sternum is closed with double strength sternal wire. The soft tissues anterior to the sternum were closed in multiple layers and the  skin is closed with a running subcuticular skin closure.  The post-bypass portion of the operation was notable for stable hemodynamics although the patient developed atrial fibrillation for  which she was started on amiodarone.  She spontaneously converted back to sinus rhythm.  No blood products were administered during the operation.   Disposition:  The patient tolerated the procedure well and is transported to the surgical intensive care in stable condition. There are no intraoperative complications. All sponge instrument and needle counts are verified correct at completion of the operation.    Valentina Gu. Roxy Manns MD 05/02/2021 12:10 PM

## 2021-05-02 NOTE — Anesthesia Preprocedure Evaluation (Signed)
Anesthesia Evaluation  Patient identified by MRN, date of birth, ID band Patient awake    Reviewed: Allergy & Precautions, NPO status , Patient's Chart, lab work & pertinent test results  Airway Mallampati: II  TM Distance: >3 FB Neck ROM: Full    Dental  (+) Teeth Intact, Dental Advisory Given   Pulmonary    breath sounds clear to auscultation       Cardiovascular hypertension,  Rhythm:Regular Rate:Normal     Neuro/Psych    GI/Hepatic   Endo/Other  diabetes  Renal/GU      Musculoskeletal   Abdominal   Peds  Hematology   Anesthesia Other Findings   Reproductive/Obstetrics                             Anesthesia Physical Anesthesia Plan  ASA: IV  Anesthesia Plan: General   Post-op Pain Management:    Induction: Intravenous  PONV Risk Score and Plan: Ondansetron and Dexamethasone  Airway Management Planned: Oral ETT  Additional Equipment: Arterial line, PA Cath, 3D TEE, Ultrasound Guidance Line Placement and CVP  Intra-op Plan:   Post-operative Plan: Post-operative intubation/ventilation  Informed Consent: I have reviewed the patients History and Physical, chart, labs and discussed the procedure including the risks, benefits and alternatives for the proposed anesthesia with the patient or authorized representative who has indicated his/her understanding and acceptance.     Dental advisory given  Plan Discussed with: CRNA and Anesthesiologist  Anesthesia Plan Comments:         Anesthesia Quick Evaluation

## 2021-05-02 NOTE — Anesthesia Procedure Notes (Signed)
Procedure Name: Intubation Date/Time: 05/02/2021 7:53 AM Performed by: Janene Harvey, CRNA Pre-anesthesia Checklist: Patient identified, Emergency Drugs available, Suction available and Patient being monitored Patient Re-evaluated:Patient Re-evaluated prior to induction Oxygen Delivery Method: Circle system utilized Preoxygenation: Pre-oxygenation with 100% oxygen Induction Type: IV induction Ventilation: Mask ventilation without difficulty Laryngoscope Size: Mac and 3 Grade View: Grade I Tube type: Oral Tube size: 8.0 mm Number of attempts: 1 Airway Equipment and Method: Stylet and Oral airway Placement Confirmation: ETT inserted through vocal cords under direct vision,  positive ETCO2 and breath sounds checked- equal and bilateral Secured at: 21 cm Tube secured with: Tape Dental Injury: Teeth and Oropharynx as per pre-operative assessment  Comments: Inserted by Paulina Fusi, SRNA

## 2021-05-02 NOTE — Progress Notes (Signed)
RT unable to get ABG.  Will notify when reporting off.

## 2021-05-03 ENCOUNTER — Inpatient Hospital Stay (HOSPITAL_COMMUNITY): Payer: PPO

## 2021-05-03 LAB — GLUCOSE, CAPILLARY
Glucose-Capillary: 131 mg/dL — ABNORMAL HIGH (ref 70–99)
Glucose-Capillary: 131 mg/dL — ABNORMAL HIGH (ref 70–99)
Glucose-Capillary: 136 mg/dL — ABNORMAL HIGH (ref 70–99)
Glucose-Capillary: 148 mg/dL — ABNORMAL HIGH (ref 70–99)
Glucose-Capillary: 157 mg/dL — ABNORMAL HIGH (ref 70–99)
Glucose-Capillary: 167 mg/dL — ABNORMAL HIGH (ref 70–99)
Glucose-Capillary: 172 mg/dL — ABNORMAL HIGH (ref 70–99)
Glucose-Capillary: 198 mg/dL — ABNORMAL HIGH (ref 70–99)
Glucose-Capillary: 219 mg/dL — ABNORMAL HIGH (ref 70–99)
Glucose-Capillary: 221 mg/dL — ABNORMAL HIGH (ref 70–99)
Glucose-Capillary: 132 mg/dL — ABNORMAL HIGH (ref 70–99)
Glucose-Capillary: 139 mg/dL — ABNORMAL HIGH (ref 70–99)
Glucose-Capillary: 163 mg/dL — ABNORMAL HIGH (ref 70–99)
Glucose-Capillary: 136 mg/dL — ABNORMAL HIGH (ref 70–99)
Glucose-Capillary: 183 mg/dL — ABNORMAL HIGH (ref 70–99)
Glucose-Capillary: 181 mg/dL — ABNORMAL HIGH (ref 70–99)
Glucose-Capillary: 143 mg/dL — ABNORMAL HIGH (ref 70–99)

## 2021-05-03 LAB — BASIC METABOLIC PANEL
Anion gap: 7 (ref 5–15)
BUN: 14 mg/dL (ref 8–23)
CO2: 23 mmol/L (ref 22–32)
Calcium: 8 mg/dL — ABNORMAL LOW (ref 8.9–10.3)
Chloride: 106 mmol/L (ref 98–111)
Creatinine, Ser: 1.07 mg/dL — ABNORMAL HIGH (ref 0.44–1.00)
GFR, Estimated: 57 mL/min — ABNORMAL LOW (ref >60)
Glucose, Bld: 135 mg/dL — ABNORMAL HIGH (ref 70–99)
Potassium: 3.7 mmol/L (ref 3.5–5.1)
Sodium: 136 mmol/L (ref 135–145)

## 2021-05-03 LAB — CBC
HCT: 35.8 % — ABNORMAL LOW (ref 36.0–46.0)
Hemoglobin: 12 g/dL (ref 12.0–15.0)
MCH: 28 pg (ref 26.0–34.0)
MCHC: 33.5 g/dL (ref 30.0–36.0)
MCV: 83.6 fL (ref 80.0–100.0)
Platelets: 244 10*3/uL (ref 150–400)
RBC: 4.28 MIL/uL (ref 3.87–5.11)
RDW: 15.2 % (ref 11.5–15.5)
WBC: 17.6 10*3/uL — ABNORMAL HIGH (ref 4.0–10.5)
nRBC: 0 % (ref 0.0–0.2)

## 2021-05-03 LAB — COOXEMETRY PANEL
Carboxyhemoglobin: 1.1 % (ref 0.5–1.5)
Methemoglobin: 1.1 % (ref 0.0–1.5)
O2 Saturation: 97.9 %
Total hemoglobin: 12.1 g/dL (ref 12.0–16.0)

## 2021-05-03 LAB — MAGNESIUM: Magnesium: 2.5 mg/dL — ABNORMAL HIGH (ref 1.7–2.4)

## 2021-05-03 MED ORDER — INSULIN DETEMIR 100 UNIT/ML ~~LOC~~ SOLN
20.0000 [IU] | Freq: Every day | SUBCUTANEOUS | Status: DC
  Filled 2021-05-03: qty 0.2

## 2021-05-03 MED ORDER — INSULIN ASPART 100 UNIT/ML IJ SOLN
0.0000 [IU] | INTRAMUSCULAR | Status: DC
  Administered 2021-05-03 (×2): 2 [IU] via SUBCUTANEOUS
  Administered 2021-05-03 (×2): 4 [IU] via SUBCUTANEOUS

## 2021-05-03 MED ORDER — INSULIN DETEMIR 100 UNIT/ML ~~LOC~~ SOLN
20.0000 [IU] | Freq: Once | SUBCUTANEOUS | Status: AC
  Administered 2021-05-03: 20 [IU] via SUBCUTANEOUS
  Filled 2021-05-03: qty 0.2

## 2021-05-03 MED ORDER — ENOXAPARIN SODIUM 30 MG/0.3ML IJ SOSY
30.0000 mg | PREFILLED_SYRINGE | Freq: Every day | INTRAMUSCULAR | Status: DC
  Administered 2021-05-04 – 2021-05-07 (×4): 30 mg via SUBCUTANEOUS
  Filled 2021-05-03 (×4): qty 0.3

## 2021-05-03 MED ORDER — POTASSIUM CHLORIDE CRYS ER 20 MEQ PO TBCR
20.0000 meq | EXTENDED_RELEASE_TABLET | ORAL | Status: DC
  Administered 2021-05-03 (×2): 20 meq via ORAL
  Filled 2021-05-03 (×2): qty 1

## 2021-05-03 MED ORDER — ATORVASTATIN CALCIUM 80 MG PO TABS
80.0000 mg | ORAL_TABLET | Freq: Every day | ORAL | Status: DC
  Administered 2021-05-05 – 2021-05-07 (×3): 80 mg via ORAL
  Filled 2021-05-03 (×3): qty 1

## 2021-05-03 MED ORDER — ORAL CARE MOUTH RINSE
15.0000 mL | Freq: Two times a day (BID) | OROMUCOSAL | Status: DC
  Administered 2021-05-03 – 2021-05-07 (×7): 15 mL via OROMUCOSAL

## 2021-05-03 NOTE — Plan of Care (Signed)

## 2021-05-03 NOTE — Progress Notes (Signed)
TCTS BRIEF SICU PROGRESS NOTE  1 Day Post-Op  S/P Procedure(s) (LRB): CORONARY ARTERY BYPASS GRAFTING (CABG) X THREE , USING LEFT INTERNAL MAMMARY ARTERY AND RIGHT GREATER SAPHENOUS VEIN HARVESTED ENDOSCOPICALLY (N/A) TRANSESOPHAGEAL ECHOCARDIOGRAM (TEE) (N/A)   Stable day  Plan: Continue current plan  Rexene Alberts, MD 05/03/2021 5:41 PM

## 2021-05-03 NOTE — Progress Notes (Signed)
      [address]       [place] [address]             [phone]        CARDIOTHORACIC SURGERY PROGRESS NOTE   R1 Day Post-Op Procedure(s) (LRB): CORONARY ARTERY BYPASS GRAFTING (CABG) X THREE , USING LEFT INTERNAL MAMMARY ARTERY AND RIGHT GREATER SAPHENOUS VEIN HARVESTED ENDOSCOPICALLY (N/A) TRANSESOPHAGEAL ECHOCARDIOGRAM (TEE) (N/A)  Subjective: Looks good.  Mild soreness in chest.  No SOB.  No nausea  Objective: Vital signs: BP Readings from Last 1 Encounters:  05/03/21 (!) 102/56   Pulse Readings from Last 1 Encounters:  05/03/21 89   Resp Readings from Last 1 Encounters:  05/03/21 (!) 21   Temp Readings from Last 1 Encounters:  05/03/21 98.06 F (36.7 C)    Hemodynamics: PAP: (16-35)/(6-22) 16/6 CO:  [4 L/min-5.7 L/min] 4.3 L/min CI:  [2.2 L/min/m2-3.1 L/min/m2] 2.4 L/min/m2  Physical Exam:  Rhythm:   NSR  Breath sounds: clear  Heart sounds:  RRR  Incisions:  Dressings dry, intact  Abdomen:  Soft, non-distended, non-tender  Extremities:  Warm, well-perfused  Chest tubes:  low volume thin serosanguinous output, no air leak    Intake/Output from previous day: 05/06 0701 - 05/07 0700 In: 8088.5 [I.V.:5330.1; Blood:900; IV Piggyback:1858.3] Out: 3881 [Urine:2770; Blood:491; Chest Tube:620] Intake/Output this shift: No intake/output data recorded.  Lab Results:  CBC: Recent Labs    05/02/21 1838 05/03/21 0313  WBC 19.5* 17.6*  HGB 11.4* 12.0  HCT 34.8* 35.8*  PLT 244 244    BMET:  Recent Labs    05/02/21 1838 05/03/21 0313  NA 131* 136  K 3.3* 3.7  CL 97* 106  CO2 18* 23  GLUCOSE 230* 135*  BUN 16 14  CREATININE 1.21* 1.07*  CALCIUM 7.5* 8.0*     PT/INR:   Recent Labs    05/02/21 1251  LABPROT 18.1*  INR 1.5*    CBG (last 3)  Recent Labs    05/03/21 0423 05/03/21 0527 05/03/21 0700  GLUCAP 131* 131* 132*    ABG    Component Value Date/Time   PHART 7.364 05/02/2021 1223   PCO2ART 43.9 05/02/2021  1223   PO2ART 209 (H) 05/02/2021 1223   HCO3 25.0 05/02/2021 1223   TCO2 26 05/02/2021 1223   ACIDBASEDEF 1.0 05/02/2021 1223   O2SAT 97.9 05/03/2021 0257    CXR: Mild bibasilar ATX L>R otherwise clear  EKG: NSR w/out acute ischemic changes    Assessment/Plan: S/P Procedure(s) (LRB): CORONARY ARTERY BYPASS GRAFTING (CABG) X THREE , USING LEFT INTERNAL MAMMARY ARTERY AND RIGHT GREATER SAPHENOUS VEIN HARVESTED ENDOSCOPICALLY (N/A) TRANSESOPHAGEAL ECHOCARDIOGRAM (TEE) (N/A)  Doing very well POD1 Maintaining NSR w/ stable hemodynamics, vasodilated on low dose levophed for BP Expected post op acute blood loss anemia, very mild Expected post op atelectasis, mild Expected post op volume excess, weight reportedly 8 kg > preop, UOP adequate Intraoperative PAF w/ no recurrence so far post op   Mobilize  Wean levophed as tolerated  Hold diuretics until BP improves  D/C lines   Rexene Alberts, MD 05/03/2021 8:40 AM

## 2021-05-04 ENCOUNTER — Inpatient Hospital Stay (HOSPITAL_COMMUNITY): Payer: PPO

## 2021-05-04 LAB — POCT I-STAT 7, (LYTES, BLD GAS, ICA,H+H)
Acid-base deficit: 5 mmol/L — ABNORMAL HIGH (ref 0.0–2.0)
Acid-base deficit: 9 mmol/L — ABNORMAL HIGH (ref 0.0–2.0)
Acid-base deficit: 10 mmol/L — ABNORMAL HIGH (ref 0.0–2.0)
Bicarbonate: 21.3 mmol/L (ref 20.0–28.0)
Bicarbonate: 15.8 mmol/L — ABNORMAL LOW (ref 20.0–28.0)
Bicarbonate: 15.4 mmol/L — ABNORMAL LOW (ref 20.0–28.0)
Calcium, Ion: 1.08 mmol/L — ABNORMAL LOW (ref 1.15–1.40)
Calcium, Ion: 1.13 mmol/L — ABNORMAL LOW (ref 1.15–1.40)
Calcium, Ion: 1.11 mmol/L — ABNORMAL LOW (ref 1.15–1.40)
HCT: 35 % — ABNORMAL LOW (ref 36.0–46.0)
HCT: 36 % (ref 36.0–46.0)
HCT: 34 % — ABNORMAL LOW (ref 36.0–46.0)
Hemoglobin: 11.9 g/dL — ABNORMAL LOW (ref 12.0–15.0)
Hemoglobin: 12.2 g/dL (ref 12.0–15.0)
Hemoglobin: 11.6 g/dL — ABNORMAL LOW (ref 12.0–15.0)
O2 Saturation: 98 %
O2 Saturation: 98 %
O2 Saturation: 96 %
Patient temperature: 35.8
Patient temperature: 35.9
Patient temperature: 36.1
Potassium: 3.5 mmol/L (ref 3.5–5.1)
Potassium: 3.8 mmol/L (ref 3.5–5.1)
Potassium: 3.8 mmol/L (ref 3.5–5.1)
Sodium: 140 mmol/L (ref 135–145)
Sodium: 138 mmol/L (ref 135–145)
Sodium: 138 mmol/L (ref 135–145)
TCO2: 23 mmol/L (ref 22–32)
TCO2: 17 mmol/L — ABNORMAL LOW (ref 22–32)
TCO2: 16 mmol/L — ABNORMAL LOW (ref 22–32)
pCO2 arterial: 40.5 mmHg (ref 32.0–48.0)
pCO2 arterial: 27.6 mmHg — ABNORMAL LOW (ref 32.0–48.0)
pCO2 arterial: 31 mmHg — ABNORMAL LOW (ref 32.0–48.0)
pH, Arterial: 7.323 — ABNORMAL LOW (ref 7.350–7.450)
pH, Arterial: 7.363 (ref 7.350–7.450)
pH, Arterial: 7.3 — ABNORMAL LOW (ref 7.350–7.450)
pO2, Arterial: 102 mmHg (ref 83.0–108.0)
pO2, Arterial: 109 mmHg — ABNORMAL HIGH (ref 83.0–108.0)
pO2, Arterial: 87 mmHg (ref 83.0–108.0)

## 2021-05-04 LAB — BASIC METABOLIC PANEL
Anion gap: 5 (ref 5–15)
BUN: 12 mg/dL (ref 8–23)
CO2: 24 mmol/L (ref 22–32)
Calcium: 8.2 mg/dL — ABNORMAL LOW (ref 8.9–10.3)
Chloride: 103 mmol/L (ref 98–111)
Creatinine, Ser: 0.97 mg/dL (ref 0.44–1.00)
GFR, Estimated: >60 mL/min (ref >60)
Glucose, Bld: 115 mg/dL — ABNORMAL HIGH (ref 70–99)
Potassium: 3.9 mmol/L (ref 3.5–5.1)
Sodium: 132 mmol/L — ABNORMAL LOW (ref 135–145)

## 2021-05-04 LAB — CBC
HCT: 33.8 % — ABNORMAL LOW (ref 36.0–46.0)
Hemoglobin: 11.4 g/dL — ABNORMAL LOW (ref 12.0–15.0)
MCH: 28.6 pg (ref 26.0–34.0)
MCHC: 33.7 g/dL (ref 30.0–36.0)
MCV: 84.7 fL (ref 80.0–100.0)
Platelets: 125 10*3/uL — ABNORMAL LOW (ref 150–400)
RBC: 3.99 MIL/uL (ref 3.87–5.11)
RDW: 15.9 % — ABNORMAL HIGH (ref 11.5–15.5)
WBC: 10.3 10*3/uL (ref 4.0–10.5)
nRBC: 0 % (ref 0.0–0.2)

## 2021-05-04 LAB — GLUCOSE, CAPILLARY
Glucose-Capillary: 112 mg/dL — ABNORMAL HIGH (ref 70–99)
Glucose-Capillary: 97 mg/dL (ref 70–99)
Glucose-Capillary: 100 mg/dL — ABNORMAL HIGH (ref 70–99)
Glucose-Capillary: 136 mg/dL — ABNORMAL HIGH (ref 70–99)
Glucose-Capillary: 130 mg/dL — ABNORMAL HIGH (ref 70–99)
Glucose-Capillary: 122 mg/dL — ABNORMAL HIGH (ref 70–99)

## 2021-05-04 MED ORDER — POTASSIUM CHLORIDE CRYS ER 20 MEQ PO TBCR
20.0000 meq | EXTENDED_RELEASE_TABLET | Freq: Two times a day (BID) | ORAL | Status: DC
  Administered 2021-05-04 (×2): 20 meq via ORAL
  Filled 2021-05-04 (×2): qty 1

## 2021-05-04 MED ORDER — INSULIN ASPART 100 UNIT/ML IJ SOLN
0.0000 [IU] | Freq: Three times a day (TID) | INTRAMUSCULAR | Status: DC
  Administered 2021-05-04: 2 [IU] via SUBCUTANEOUS
  Administered 2021-05-05 – 2021-05-06 (×2): 3 [IU] via SUBCUTANEOUS
  Administered 2021-05-07: 5 [IU] via SUBCUTANEOUS
  Administered 2021-05-08: 3 [IU] via SUBCUTANEOUS

## 2021-05-04 MED ORDER — POTASSIUM CHLORIDE 10 MEQ/50ML IV SOLN
10.0000 meq | INTRAVENOUS | Status: DC
  Administered 2021-05-04 (×2): 10 meq via INTRAVENOUS
  Filled 2021-05-04 (×3): qty 50

## 2021-05-04 MED ORDER — ~~LOC~~ CARDIAC SURGERY, PATIENT & FAMILY EDUCATION: Freq: Once | Status: AC

## 2021-05-04 MED ORDER — FUROSEMIDE 40 MG PO TABS: 40.0000 mg | ORAL_TABLET | Freq: Two times a day (BID) | ORAL | Status: DC

## 2021-05-04 MED ORDER — FUROSEMIDE 10 MG/ML IJ SOLN
40.0000 mg | Freq: Two times a day (BID) | INTRAMUSCULAR | Status: AC
  Administered 2021-05-04 (×2): 40 mg via INTRAVENOUS
  Filled 2021-05-04 (×2): qty 4

## 2021-05-04 MED ORDER — POTASSIUM CHLORIDE 10 MEQ/100ML IV SOLN
10.0000 meq | Freq: Once | INTRAVENOUS | Status: DC
  Administered 2021-05-04: 10 meq via INTRAVENOUS
  Filled 2021-05-04: qty 100

## 2021-05-04 MED ORDER — INSULIN DETEMIR 100 UNIT/ML ~~LOC~~ SOLN
15.0000 [IU] | Freq: Every day | SUBCUTANEOUS | Status: DC
  Administered 2021-05-04: 15 [IU] via SUBCUTANEOUS
  Filled 2021-05-04 (×3): qty 0.15

## 2021-05-04 NOTE — Progress Notes (Signed)
Pt arrived to rm 6 from [facility]. Initiated tele. CHG wipe given. Oriented to the unit. VSS. Call bell within reach.   Lavenia Atlas, RN

## 2021-05-04 NOTE — Progress Notes (Signed)
[address]       [place] [address]             [phone]        CARDIOTHORACIC SURGERY PROGRESS NOTE   R2 Days Post-Op Procedure(s) (LRB): CORONARY ARTERY BYPASS GRAFTING (CABG) X THREE , USING LEFT INTERNAL MAMMARY ARTERY AND RIGHT GREATER SAPHENOUS VEIN HARVESTED ENDOSCOPICALLY (N/A) TRANSESOPHAGEAL ECHOCARDIOGRAM (TEE) (N/A)  Subjective: Feeling better.  Slept some.  Mild soreness in chest  Objective: Vital signs: BP Readings from Last 1 Encounters:  05/04/21 (!) 125/58   Pulse Readings from Last 1 Encounters:  05/04/21 79   Resp Readings from Last 1 Encounters:  05/04/21 16   Temp Readings from Last 1 Encounters:  05/04/21 97.7 F (36.5 C)    Hemodynamics: PAP: (23-29)/(11-13) 23/11  Physical Exam:  Rhythm:   NSR  Breath sounds: clear  Heart sounds:  RRR  Incisions:  Dressings dry, intact  Abdomen:  Soft, non-distended, non-tender  Extremities:  Warm, well-perfused  Chest tubes:  decreasing volume thin serosanguinous output, no air leak    Intake/Output from previous day: 05/07 0701 - 05/08 0700 In: 970.6 [I.V.:813.7; IV Piggyback:156.9] Out: 1100 [Urine:720; Chest Tube:380] Intake/Output this shift: Total I/O In: 129.7 [I.V.:10; IV Piggyback:119.8] Out: 195 [Urine:65; Chest Tube:130]  Lab Results:  CBC: Recent Labs    05/03/21 0313 05/04/21 0500  WBC 17.6* 10.3  HGB 12.0 11.4*  HCT 35.8* 33.8*  PLT 244 125*    BMET:  Recent Labs    05/03/21 0313 05/04/21 0500  NA 136 132*  K 3.7 3.9  CL 106 103  CO2 23 24  GLUCOSE 135* 115*  BUN 14 12  CREATININE 1.07* 0.97  CALCIUM 8.0* 8.2*     PT/INR:   Recent Labs    05/02/21 1251  LABPROT 18.1*  INR 1.5*    CBG (last 3)  Recent Labs    05/03/21 2258 05/04/21 0401 05/04/21 0725  GLUCAP 143* 112* 97    ABG    Component Value Date/Time   PHART 7.300 (L) 05/02/2021 1740   PCO2ART 31.0 (L) 05/02/2021 1740   PO2ART 87 05/02/2021 1740   HCO3 15.4  (L) 05/02/2021 1740   TCO2 16 (L) 05/02/2021 1740   ACIDBASEDEF 10.0 (H) 05/02/2021 1740   O2SAT 97.9 05/03/2021 0257    CXR: PORTABLE CHEST 1 VIEW  COMPARISON:  05/03/2021 and older studies.  FINDINGS: Since the previous day's exam the Swan-Ganz catheter has been removed. The introducer sheath remains in place. The left chest tube and mediastinal tubes are stable.  Cardiac silhouette normal in size.  No mediastinal widening.  There is persistent opacity at the left lung base consistent with a combination of pleural fluid and atelectasis. Mild hazy opacity is noted at the right lung base, new from the previous day's exam, also consistent with a small effusion and atelectasis. Remainder of the lungs is clear.  No pneumothorax.  IMPRESSION: 1. Left greater than right lung base opacities consistent with a combination of atelectasis and pleural fluid, a left stable, the right appearing new since the previous day's exam. 2. No evidence of pulmonary edema. No pneumothorax. No mediastinal widening. 3. Remaining support apparatus is stable and well positioned.   Electronically Signed   By: Lajean Manes M.D.   On: 05/04/2021 08:13   Assessment/Plan: S/P Procedure(s) (LRB): CORONARY ARTERY BYPASS GRAFTING (CABG) X THREE , USING LEFT INTERNAL MAMMARY ARTERY AND RIGHT GREATER SAPHENOUS VEIN HARVESTED ENDOSCOPICALLY (  N/A) TRANSESOPHAGEAL ECHOCARDIOGRAM (TEE) (N/A)  Doing well POD2 Mobilize D/C tubes and foley Diuresis Advance diet Transfer 4E  Rexene Alberts, MD 05/04/2021 9:13 AM

## 2021-05-05 ENCOUNTER — Inpatient Hospital Stay (HOSPITAL_COMMUNITY): Payer: PPO

## 2021-05-05 ENCOUNTER — Encounter (HOSPITAL_COMMUNITY): Payer: Self-pay | Admitting: Thoracic Surgery (Cardiothoracic Vascular Surgery)

## 2021-05-05 LAB — TYPE AND SCREEN
ABO/RH(D): A POS
Antibody Screen: NEGATIVE
Unit division: 0
Unit division: 0
Unit division: 0
Unit division: 0
Unit division: 0
Unit division: 0

## 2021-05-05 LAB — CBC
HCT: 34 % — ABNORMAL LOW (ref 36.0–46.0)
Hemoglobin: 11.3 g/dL — ABNORMAL LOW (ref 12.0–15.0)
MCH: 28.4 pg (ref 26.0–34.0)
MCHC: 33.2 g/dL (ref 30.0–36.0)
MCV: 85.4 fL (ref 80.0–100.0)
Platelets: 156 10*3/uL (ref 150–400)
RBC: 3.98 MIL/uL (ref 3.87–5.11)
RDW: 15.9 % — ABNORMAL HIGH (ref 11.5–15.5)
WBC: 11.6 10*3/uL — ABNORMAL HIGH (ref 4.0–10.5)
nRBC: 0 % (ref 0.0–0.2)

## 2021-05-05 LAB — BASIC METABOLIC PANEL
Anion gap: 7 (ref 5–15)
BUN: 17 mg/dL (ref 8–23)
CO2: 25 mmol/L (ref 22–32)
Calcium: 8.2 mg/dL — ABNORMAL LOW (ref 8.9–10.3)
Chloride: 102 mmol/L (ref 98–111)
Creatinine, Ser: 0.98 mg/dL (ref 0.44–1.00)
GFR, Estimated: >60 mL/min (ref >60)
Glucose, Bld: 64 mg/dL — ABNORMAL LOW (ref 70–99)
Potassium: 4.1 mmol/L (ref 3.5–5.1)
Sodium: 134 mmol/L — ABNORMAL LOW (ref 135–145)

## 2021-05-05 LAB — GLUCOSE, CAPILLARY
Glucose-Capillary: 128 mg/dL — ABNORMAL HIGH (ref 70–99)
Glucose-Capillary: 123 mg/dL — ABNORMAL HIGH (ref 70–99)
Glucose-Capillary: 50 mg/dL — ABNORMAL LOW (ref 70–99)
Glucose-Capillary: 87 mg/dL (ref 70–99)
Glucose-Capillary: 115 mg/dL — ABNORMAL HIGH (ref 70–99)
Glucose-Capillary: 166 mg/dL — ABNORMAL HIGH (ref 70–99)
Glucose-Capillary: 124 mg/dL — ABNORMAL HIGH (ref 70–99)

## 2021-05-05 LAB — BPAM RBC
Blood Product Expiration Date: 202205122359
Blood Product Expiration Date: 202205122359
Blood Product Expiration Date: 202205272359
Blood Product Expiration Date: 202205272359
Blood Product Expiration Date: 202205282359
Blood Product Expiration Date: 202205282359
ISSUE DATE / TIME: 202205061115
ISSUE DATE / TIME: 202205061115
ISSUE DATE / TIME: 202205081656
ISSUE DATE / TIME: 202205081911
Unit Type and Rh: 6200
Unit Type and Rh: 6200
Unit Type and Rh: 6200
Unit Type and Rh: 6200
Unit Type and Rh: 6200
Unit Type and Rh: 6200

## 2021-05-05 MED ORDER — AMIODARONE LOAD VIA INFUSION: 150.0000 mg | Freq: Once | INTRAVENOUS | Status: DC

## 2021-05-05 MED ORDER — FUROSEMIDE 10 MG/ML IJ SOLN
40.0000 mg | Freq: Two times a day (BID) | INTRAMUSCULAR | Status: AC
  Administered 2021-05-05 (×2): 40 mg via INTRAVENOUS
  Filled 2021-05-05 (×2): qty 4

## 2021-05-05 MED ORDER — AMIODARONE HCL IN DEXTROSE 360-4.14 MG/200ML-% IV SOLN
60.0000 mg/h | INTRAVENOUS | Status: AC
  Administered 2021-05-05: 60 mg/h via INTRAVENOUS
  Filled 2021-05-05 (×2): qty 200

## 2021-05-05 MED ORDER — AMIODARONE LOAD VIA INFUSION
150.0000 mg | Freq: Once | INTRAVENOUS | Status: AC
  Administered 2021-05-05: 150 mg via INTRAVENOUS
  Filled 2021-05-05: qty 83.34

## 2021-05-05 MED ORDER — POTASSIUM CHLORIDE CRYS ER 20 MEQ PO TBCR
30.0000 meq | EXTENDED_RELEASE_TABLET | Freq: Two times a day (BID) | ORAL | Status: DC
  Administered 2021-05-05 (×2): 30 meq via ORAL
  Filled 2021-05-05 (×2): qty 1

## 2021-05-05 MED ORDER — AMIODARONE HCL IN DEXTROSE 360-4.14 MG/200ML-% IV SOLN
30.0000 mg/h | INTRAVENOUS | Status: DC
  Administered 2021-05-05 (×2): 30 mg/h via INTRAVENOUS
  Filled 2021-05-05: qty 200

## 2021-05-05 MED ORDER — METOPROLOL TARTRATE 25 MG PO TABS
25.0000 mg | ORAL_TABLET | Freq: Two times a day (BID) | ORAL | Status: DC
  Administered 2021-05-05 – 2021-05-08 (×7): 25 mg via ORAL
  Filled 2021-05-05 (×7): qty 1

## 2021-05-05 MED FILL — Potassium Chloride Inj 2 mEq/ML: INTRAVENOUS | Qty: 40 | Status: AC

## 2021-05-05 MED FILL — Magnesium Sulfate Inj 50%: INTRAMUSCULAR | Qty: 10 | Status: AC

## 2021-05-05 MED FILL — Heparin Sodium (Porcine) Inj 1000 Unit/ML: INTRAMUSCULAR | Qty: 30 | Status: AC

## 2021-05-05 NOTE — Progress Notes (Signed)
Pt converted to AFIB while getting back to bed from the bathroom. HR was in he 150s, 2.5mg  metoprolol given . MD on call paged, awaiting for return paged. We'll continue to monitor.

## 2021-05-05 NOTE — Progress Notes (Signed)
[id number] Pt does not feel up to walking at this time. Had her do IS 5x at [phone] ml and flutter valve 3x. Encouraged her to walk later with Mobility specialist and to do IS and flutter valve 10x an hour. Pt able to do both correctly with cues.  Emotional support given. Graylon Good RN BSN 05/05/2021 10:43 AM

## 2021-05-05 NOTE — Progress Notes (Signed)
Mobility Specialist - Progress Note   05/05/21 1321  Mobility  Activity Ambulated in hall  Level of Assistance Minimal assist, patient does 75% or more  Assistive Device Front wheel walker  Distance Ambulated (ft) 180 ft  Mobility Response Tolerated fair  Mobility performed by Mobility specialist  $Mobility charge 1 Mobility   Pre-mobility: 69 HR, 101/68 BP, 98% SpO2 Post-mobility: 71 HR, 117/62 BP, 100% SpO2  Pt stating she feels weak today. Distance limited by fatigue. She was min assist for bed mobility and to stand from bed. Pt back in bed after walk, visitor in room.   Pricilla Handler Mobility Specialist Mobility Specialist Phone: [phone]

## 2021-05-05 NOTE — Progress Notes (Signed)
Get return page from Doctor Roxy Manns, and order to start Amiodarone with the bolus.

## 2021-05-05 NOTE — Progress Notes (Signed)
  Amiodarone Drug - Drug Interaction Consult Note    Amiodarone is metabolized by the cytochrome P450 system and therefore has the potential to cause many drug interactions. Amiodarone has an average plasma half-life of 50 days (range 20 to 100 days).   There is potential for drug interactions to occur several weeks or months after stopping treatment and the onset of drug interactions may be slow after initiating amiodarone.   [x]  Statins: Increased risk of myopathy. Simvastatin- restrict dose to 20mg  daily. Other statins: counsel patients to report any muscle pain or weakness immediately.  []  Anticoagulants: Amiodarone can increase anticoagulant effect. Consider warfarin dose reduction. Patients should be monitored closely and the dose of anticoagulant altered accordingly, remembering that amiodarone levels take several weeks to stabilize.  []  Antiepileptics: Amiodarone can increase plasma concentration of phenytoin, the dose should be reduced. Note that small changes in phenytoin dose can result in large changes in levels. Monitor patient and counsel on signs of toxicity.  [x]  Beta blockers: increased risk of bradycardia, AV block and myocardial depression. Sotalol - avoid concomitant use.  []   Calcium channel blockers (diltiazem and verapamil): increased risk of bradycardia, AV block and myocardial depression.  []   Cyclosporine: Amiodarone increases levels of cyclosporine. Reduced dose of cyclosporine is recommended.  []  Digoxin dose should be halved when amiodarone is started.  [x]  Diuretics: increased risk of cardiotoxicity if hypokalemia occurs.  --- current K is 4.1 within normal limits   []  Oral hypoglycemic agents (glyburide, glipizide, glimepiride): increased risk of hypoglycemia. Patient's glucose levels should be monitored closely when initiating amiodarone therapy.   []  Drugs that prolong the QT interval:  Torsades de pointes risk may be increased with concurrent use - avoid  if possible.  Monitor QTc, also keep magnesium/potassium WNL if concurrent therapy can't be avoided. Marland Kitchen Antibiotics: e.g. fluoroquinolones, erythromycin. . Antiarrhythmics: e.g. quinidine, procainamide, disopyramide, sotalol. . Antipsychotics: e.g. phenothiazines, haloperidol.  . Lithium, tricyclic antidepressants, and methadone.   Thank You,  Nicole Cella, RPh Clinical Pharmacist  [phone] Please check AMION for all [place] phone numbers After 10:00 PM, call [place] [phone]  05/05/2021 7:22 AM

## 2021-05-05 NOTE — Care Management Important Message (Signed)
Important Message  Patient Details  Name: Crystal Fox MRN: [id number] Date of Birth: [birth year]   Medicare Important Message Given:  Yes     Shelda Altes 05/05/2021, 10:25 AM

## 2021-05-05 NOTE — Progress Notes (Addendum)
      [address]       [place] [address]             [phone]       3 Days Post-Op Procedure(s) (LRB): CORONARY ARTERY BYPASS GRAFTING (CABG) X THREE , USING LEFT INTERNAL MAMMARY ARTERY AND RIGHT GREATER SAPHENOUS VEIN HARVESTED ENDOSCOPICALLY (N/A) TRANSESOPHAGEAL ECHOCARDIOGRAM (TEE) (N/A)   Subjective:  Patient doesn't feel all that well.  She states going down to xray wore her out.  + ambulation   + BM  Objective: Vital signs in last 24 hours: Temp:  [97.2 F (36.2 C)-98.1 F (36.7 C)] 98 F (36.7 C) (05/09 0738) Pulse Rate:  [66-138] 138 (05/09 0738) Cardiac Rhythm: Atrial fibrillation (05/09 0731) Resp:  [16-29] 18 (05/09 0738) BP: (100-129)/(55-74) 101/56 (05/09 0738) SpO2:  [93 %-99 %] 96 % (05/09 0738) Arterial Line BP: (110-122)/(47-54) 120/51 (05/08 1000) Weight:  [90.1 kg-91.1 kg] 91.1 kg (05/09 0414)  Intake/Output from previous day: 05/08 0701 - 05/09 0700 In: 332.2 [I.V.:10; IV Piggyback:322.2] Out: 1145 [Urine:975; Chest Tube:170]  General appearance: alert, cooperative and no distress Heart: irregularly irregular rhythm Lungs: clear to auscultation bilaterally Abdomen: soft, non-tender; bowel sounds normal; no masses,  no organomegaly Extremities: edema trace Wound: clean and dry  Lab Results: Recent Labs    05/04/21 0500 05/05/21 0257  WBC 10.3 11.6*  HGB 11.4* 11.3*  HCT 33.8* 34.0*  PLT 125* 156   BMET:  Recent Labs    05/04/21 0500 05/05/21 0257  NA 132* 134*  K 3.9 4.1  CL 103 102  CO2 24 25  GLUCOSE 115* 64*  BUN 12 17  CREATININE 0.97 0.98  CALCIUM 8.2* 8.2*    PT/INR:  Recent Labs    05/02/21 1251  LABPROT 18.1*  INR 1.5*   ABG    Component Value Date/Time   PHART 7.300 (L) 05/02/2021 1740   HCO3 15.4 (L) 05/02/2021 1740   TCO2 16 (L) 05/02/2021 1740   ACIDBASEDEF 10.0 (H) 05/02/2021 1740   O2SAT 97.9 05/03/2021 0257   CBG (last 3)  Recent Labs    05/04/21 2106 05/05/21 0614  05/05/21 0732  GLUCAP 122* 50* 87    Assessment/Plan: S/P Procedure(s) (LRB): CORONARY ARTERY BYPASS GRAFTING (CABG) X THREE , USING LEFT INTERNAL MAMMARY ARTERY AND RIGHT GREATER SAPHENOUS VEIN HARVESTED ENDOSCOPICALLY (N/A) TRANSESOPHAGEAL ECHOCARDIOGRAM (TEE) (N/A)  1. CV- developed Atrial Fibrillation with RVR this morning-Amiodarone bolus and drip ordered- continue Lopressor 2. Pulm- no acute issues, continue IS 3. Renal- creatinine WNL, weight is elevated, IV Lasix ordered 4. Expected post operative blood loss anemia, mild Hgb 11.3 5. DM- hypoglycemic, will stop Levemir, continue SSIP, can resume Metformin if needed 6. Dispo- patient developed rapid Atrial Fibrillation with RVR, Amiodarone protocol has been ordered, + volume overloaded, IV lasix ordered, hypoglycemic stop Levemir, continue SSIP, can restart Metformin if needed   LOS: 4 days    Ellwood Handler, PA-C 05/05/2021   I have seen and examined the patient and agree with the assessment and plan as outlined.  Rexene Alberts, MD 05/05/2021 8:26 AM

## 2021-05-06 LAB — GLUCOSE, CAPILLARY
Glucose-Capillary: 84 mg/dL (ref 70–99)
Glucose-Capillary: 128 mg/dL — ABNORMAL HIGH (ref 70–99)
Glucose-Capillary: 165 mg/dL — ABNORMAL HIGH (ref 70–99)
Glucose-Capillary: 157 mg/dL — ABNORMAL HIGH (ref 70–99)

## 2021-05-06 MED ORDER — AMIODARONE HCL 200 MG PO TABS
200.0000 mg | ORAL_TABLET | Freq: Two times a day (BID) | ORAL | Status: DC
  Administered 2021-05-06 – 2021-05-08 (×5): 200 mg via ORAL
  Filled 2021-05-06 (×5): qty 1

## 2021-05-06 MED ORDER — FUROSEMIDE 10 MG/ML IJ SOLN
40.0000 mg | Freq: Two times a day (BID) | INTRAMUSCULAR | Status: DC
  Administered 2021-05-06 (×2): 40 mg via INTRAVENOUS
  Filled 2021-05-06 (×2): qty 4

## 2021-05-06 MED ORDER — POTASSIUM CHLORIDE CRYS ER 20 MEQ PO TBCR
40.0000 meq | EXTENDED_RELEASE_TABLET | Freq: Two times a day (BID) | ORAL | Status: AC
  Administered 2021-05-06 – 2021-05-07 (×4): 40 meq via ORAL
  Filled 2021-05-06 (×4): qty 2

## 2021-05-06 NOTE — Progress Notes (Signed)
Mobility Specialist - Progress Note   05/06/21 1258  Mobility  Activity Ambulated in hall  Level of Assistance Minimal assist, patient does 75% or more  Assistive Device Front wheel walker  Distance Ambulated (ft) 300 ft  Mobility Ambulated with assistance in hallway  Mobility Response Tolerated well  Mobility performed by Mobility specialist  $Mobility charge 1 Mobility   Pre-mobility: 70 HR, 134/72 BP Post-mobility: 73 HR, 154/85 BP  Pt min assist to sit up on edge of bed, she was able to stand while adhering to sternal precautions w/o assist. Distance limited by fatigue. Pt to recliner after walk, visitors in room.   Pricilla Handler Mobility Specialist Mobility Specialist Phone: [phone]

## 2021-05-06 NOTE — Discharge Summary (Signed)
[address]       [place] [address]             [phone]    Physician Discharge Summary  Patient ID: Crystal Fox MRN: [id number] DOB/AGE: 07-12-1951 70 y.o.  Admit date: 05/01/2021 Discharge date: 05/08/2021  Admission Diagnoses:  Patient Active Problem List   Diagnosis Date Noted  . CAD (coronary artery disease) 05/01/2021  . Coronary artery disease involving native coronary artery of native heart without angina pectoris    Discharge Diagnoses:  Patient Active Problem List   Diagnosis Date Noted  . 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 CAD (coronary artery disease) 05/01/2021  . Coronary artery disease involving native coronary artery of native heart without angina pectoris    Discharged Condition: good  History of Present Illness:   Patient is a 70 year old female recently seen in cardiology evaluation for complaints of palpitations with work-up notable for  cardiomyopathy of uncertain etiology.  She underwent CT scan and calcium score was noted to be significantly elevated at 1085.  Additionally she underwent an echocardiogram in April 2022 which showed reduced ejection fraction at 30 to 35% with akinesis of the mid distal anteroseptal wall and apex, grade 1 diastolic dysfunction, mild mitral and tricuspid regurgitation.  She began to have symptoms last December, specifically palpitations.  Recently she has been asymptomatic and denies chest pain, palpitations, lower extremity edema, PND, or syncope.  She does have significant cardiac risk factors including diabetes, hyperlipidemia and hypertension.  She is a lifelong non-smoker.  She does have a history of cardiac disease in her family with father deceased from myocardial infarction.  Due to her recent symptoms and above findings it was felt that she should undergo diagnostic cardiac catheterization which first performed on today's date.  Findings were remarkable for severe multivessel coronary artery disease  with chronic occlusion of the LAD. Due to multivessel disease as well as history of diabetes cardiology request consultation with CT surgery for consideration of coronary artery surgical revascularization.  She was evaluated by Dr. Roxy Manns who was in agreement the patient would benefit from coronary bypass grafting procedure.  The risks and benefits of the procedure were explained to the patient and she was agreeable to proceed.  Hospital Course:  Patient was taken to the operating room on 05/02/2021.  She underwent CABG x 3 utilizing LIMA to LAD, SVG to Ramus Intermediate, and SVG to PDA.  She also underwent endoscopic harvest of greater saphenous vein from her right leg.  She tolerated the procedure without difficulty and was taken to the SICU in stable condition. She was extubated the evening of surgery.  During her stay in the SICU the patient was weaned off Milrinone and Levophed as hemodynamics allowed. She experienced Atrial Fibrillation but this did not recur post op.  Her chest tubes and arterial lines were removed without difficulty.  She was medically stable for transfer to the progressive care unit on 05/04/2021.  She developed Atrial Fibrillation with RVR.  She was treated with Amiodarone drip and converted without difficulty.  She remained in NSR.  Her pacing wires were removed without difficulty.  She remained volume overloaded and was aggressively diuresed with Lasix.  Her blood pressure remained labile and she was unable to be restarted on her home Entresto.  She developed a low magnesium level and was supplemented with Mag Sulfate via IV.  She was ambulating without difficulty.  Her surgical incisions are healing without  evidence of infection.  She is medically stable for discharge home today.   Significant Diagnostic Studies: angiography:    Prox RCA to Mid RCA lesion is 50% stenosed.  RPAV lesion is 40% stenosed.  Mid RCA lesion is 95% stenosed.  Ramus lesion is 99% stenosed.  Mid Cx  lesion is 60% stenosed.  Prox LAD to Mid LAD lesion is 100% stenosed.   1. Chronic occlusion mid LAD with faint collateral filling from left to left collaterals 2. Severe stenosis in the small to moderate caliber ramus intermediate branch 3. Moderate stenosis in the mid Circumflex 4. Severe stenosis in the mid segment of the large, dominant RCA. The proximal and mid vessel is calcified.   Treatments:   Date of Procedure:    05/02/2021  Preoperative Diagnosis:      Severe 3-vessel Coronary Artery Disease  Postoperative Diagnosis:    Same  Procedure:        Coronary Artery Bypass Grafting x 3              Left Internal Mammary Artery to Distal Left Anterior Descending Coronary Artery             Saphenous Vein Graft to Posterior Descending Coronary Artery             Saphenous Vein Graft to Ramus Intermediate Branch Coronary Artery             Endoscopic Vein Harvest from Right Thigh and Lower Leg  Surgeon:        Valentina Gu. Roxy Manns, MD  Assistant:       John Giovanni, PA-C   Discharge Exam: Blood pressure (!) 122/59, pulse 84, temperature 98 F (36.7 C), temperature source Oral, resp. rate 16, height 5\' 3"  (1.6 m), weight 84.9 kg, last menstrual period 12/28/2004, SpO2 100 %.  General appearance: alert, cooperative and no distress Heart: regular rate and rhythm Lungs: clear to auscultation bilaterally Abdomen: soft, non-tender; bowel sounds normal; no masses,  no organomegaly Extremities: edema trace Wound: clean and dry   Discharge Medications:  The patient has been discharged on:   1.Beta Blocker:  Yes [  X ]                              No   [   ]                              If No, reason:  2.Ace Inhibitor/ARB: Yes [   ]                                     No  [  x  ]                                     If No, reason: labile BP  3.Statin:   Yes Valu.Nieves ]                  No  [   ]                  If No, reason:  4.Ecasa:  Yes  [ X  ]  No    [   ]                  If No, reason:    Discharge Instructions    Amb Referral to Cardiac Rehabilitation   Complete by: As directed    Diagnosis: CABG   CABG X ___: 3   After initial evaluation and assessments completed: Virtual Based Care may be provided alone or in conjunction with Phase 2 Cardiac Rehab based on patient barriers.: Yes     Allergies as of 05/08/2021      Reactions   Sulfa Antibiotics Rash      Medication List    STOP taking these medications   metoprolol succinate 25 MG 24 hr tablet Commonly known as: Toprol XL   sacubitril-valsartan 49-51 MG Commonly known as: ENTRESTO     TAKE these medications   Accu-Chek Aviva Plus test strip Generic drug: glucose blood 1 each by Other route every morning.   Accu-Chek FastClix Lancets Misc use to self monitor blood glucose four to six times per day   amiodarone 200 MG tablet Commonly known as: PACERONE Take 1 tablet (200 mg total) by mouth 2 (two) times daily.   aspirin 81 MG chewable tablet Chew 81 mg by mouth in the morning.   atorvastatin 80 MG tablet Commonly known as: LIPITOR Take 1 tablet (80 mg total) by mouth daily. What changed: when to take this   BD Pen Needle Nano U/F 32G X 4 MM Misc Generic drug: Insulin Pen Needle   CALCIUM 500 + D3 PO Take 1 tablet by mouth daily at 2 PM.   CoQ-10 100 MG Caps Take 100 mg by mouth every evening.   Estradiol 10 MCG Tabs vaginal tablet Commonly known as: Yuvafem PLACE 1 TABLET (10 MCG TOTAL) VAGINALLY 2 (TWO) TIMES A WEEK. What changed:   how much to take  how to take this  when to take this  additional instructions   furosemide 40 MG tablet Commonly known as: Lasix Take 1 tablet (40 mg total) by mouth daily. Start taking on: May 09, 2021   Invokana 300 MG Tabs tablet Generic drug: canagliflozin Take 300 mg by mouth in the morning.   latanoprost 0.005 % ophthalmic solution Commonly known as: XALATAN Place 1 drop into both eyes at  bedtime.   loratadine 10 MG tablet Commonly known as: CLARITIN Take 10 mg by mouth in the morning.   metFORMIN 500 MG 24 hr tablet Commonly known as: GLUCOPHAGE-XR Take 1,000 mg by mouth 2 (two) times daily with a meal.   metoprolol tartrate 25 MG tablet Commonly known as: LOPRESSOR Take 1 tablet (25 mg total) by mouth 2 (two) times daily.   multivitamin with minerals Tabs tablet Take 1 tablet by mouth daily at 2 PM. Multivitamin w/Omega-3   potassium chloride SA 20 MEQ tablet Commonly known as: KLOR-CON Take 1 tablet (20 mEq total) by mouth daily. Start taking on: May 09, 2021   traMADol 50 MG tablet Commonly known as: ULTRAM Take 1-2 tablets (50-100 mg total) by mouth every 4 (four) hours as needed for moderate pain.   Tyler Aas FlexTouch 200 UNIT/ML FlexTouch Pen Generic drug: insulin degludec Inject 26 Units into the skin at bedtime.   Trulicity 1.5 0000000 Sopn Generic drug: Dulaglutide Inject 1.5 mg into the skin every Wednesday.   TURMERIC-GINGER PO Take 1 capsule by mouth daily in the afternoon.   VITAMIN C GUMMIES PO Take 250 mg by mouth in  the morning.            Durable Medical Equipment  (From admission, onward)         Start     Ordered   05/06/21 1225  For home use only DME Shower stool  Once        05/06/21 1224          Follow-up Information    Loel Dubonnet, NP Follow up on 05/21/2021.   Specialty: Cardiology Why: Appointment is at 9:15  Contact information: [facility] [address] [phone]        [facility] Follow up on 06/02/2021.   Specialty: Cardiothoracic Surgery Why: Appointment is at 1:30, please get CXR at 1:00 at [facility] located on first floor of our office building Contact information: [place], [place] [phone]              Signed:  Ellwood Handler, PA-C 05/08/2021, 7:36  AM

## 2021-05-06 NOTE — Progress Notes (Signed)
CARDIAC REHAB PHASE I   PRE:  Rate/Rhythm: 72 SR  BP:  Supine: 138/72  Sitting:   Standing:    SaO2: 93%RA  MODE:  Ambulation: 280 ft   POST:  Rate/Rhythm: 90 SR  BP:  Supine:   Sitting: 137/88  Standing:    SaO2: 96%RA 0805-0835 Pt walked 280 ft on RA with rolling walker (has walker at home if needed) with slow steady gait. Stopped a few times to rest. C/o generalized weakness. Remained in NSR. To recliner with call bell and I set up her breakfast. Discussed CRP 2 and referral for [place] done. Pt stated her husband has done program before. Will ed with husband and pt tomorrow.   Graylon Good, RN BSN  05/06/2021 8:30 AM

## 2021-05-06 NOTE — Discharge Instructions (Signed)

## 2021-05-06 NOTE — Progress Notes (Addendum)
      [address]       [place] [address]             [phone]      4 Days Post-Op Procedure(s) (LRB): CORONARY ARTERY BYPASS GRAFTING (CABG) X THREE , USING LEFT INTERNAL MAMMARY ARTERY AND RIGHT GREATER SAPHENOUS VEIN HARVESTED ENDOSCOPICALLY (N/A) TRANSESOPHAGEAL ECHOCARDIOGRAM (TEE) (N/A)   Subjective:  No new complaints.  Patient had a rough day yesterday with A. Fib and anxiety.  + ambulation   + BM  Objective: Vital signs in last 24 hours: Temp:  [97.7 F (36.5 C)-98.1 F (36.7 C)] 98 F (36.7 C) (05/10 0735) Pulse Rate:  [65-74] 70 (05/10 0735) Cardiac Rhythm: Normal sinus rhythm (05/10 0727) Resp:  [14-26] 20 (05/10 0735) BP: (101-138)/(53-78) 138/72 (05/10 0735) SpO2:  [94 %-98 %] 94 % (05/10 0735) Weight:  [90.3 kg] 90.3 kg (05/10 0329)  Intake/Output from previous day: 05/09 0701 - 05/10 0700 In: 480 [P.O.:480] Out: 1300 [Urine:1300]  General appearance: alert, cooperative and no distress Heart: regular rate and rhythm Lungs: clear to auscultation bilaterally Abdomen: soft, non-tender; bowel sounds normal; no masses,  no organomegaly Extremities: edema trace Wound: clean and dry  Lab Results: Recent Labs    05/04/21 0500 05/05/21 0257  WBC 10.3 11.6*  HGB 11.4* 11.3*  HCT 33.8* 34.0*  PLT 125* 156   BMET:  Recent Labs    05/04/21 0500 05/05/21 0257  NA 132* 134*  K 3.9 4.1  CL 103 102  CO2 24 25  GLUCOSE 115* 64*  BUN 12 17  CREATININE 0.97 0.98  CALCIUM 8.2* 8.2*    PT/INR: No results for input(s): LABPROT, INR in the last 72 hours. ABG    Component Value Date/Time   PHART 7.300 (L) 05/02/2021 1740   HCO3 15.4 (L) 05/02/2021 1740   TCO2 16 (L) 05/02/2021 1740   ACIDBASEDEF 10.0 (H) 05/02/2021 1740   O2SAT 97.9 05/03/2021 0257   CBG (last 3)  Recent Labs    05/05/21 1621 05/05/21 2025 05/06/21 0609  GLUCAP 166* 124* 84    Assessment/Plan: S/P Procedure(s) (LRB): CORONARY ARTERY BYPASS GRAFTING  (CABG) X THREE , USING LEFT INTERNAL MAMMARY ARTERY AND RIGHT GREATER SAPHENOUS VEIN HARVESTED ENDOSCOPICALLY (N/A) TRANSESOPHAGEAL ECHOCARDIOGRAM (TEE) (N/A)  1. CV- A. Fib, currently in NSR- will transition to oral Amiodarone,  Continue Lopressor.Marland Kitchen if possible can restart Entresto prior to discharge 2. Pulm- no acute issues, continue IS 3. Renal- creatinine, weight is trending down, on IV Lasix, K at 4.1 4. DM- cbgs controlled, continue to hold oral agents for now 5. Dispo- patient stable, in NSR, will d/c EPW today, if BP will tolerate can resume entresto prior to discharge, continue diuretics, if remains clinically stable possibly for d/c in AM   LOS: 5 days    Ellwood Handler, PA-C 05/06/2021   I have seen and examined the patient and agree with the assessment and plan as outlined.  Still needs aggressive diuresis.  Rexene Alberts, MD 05/06/2021 8:32 AM

## 2021-05-06 NOTE — Progress Notes (Signed)
Mobility Specialist - Progress Note   05/06/21 1536  Mobility  Activity Ambulated in hall  Level of Assistance Minimal assist, patient does 75% or more  Assistive Device Front wheel walker  Distance Ambulated (ft) 380 ft  Mobility Ambulated with assistance in hallway  Mobility Response Tolerated well  Mobility performed by Mobility specialist  $Mobility charge 1 Mobility   Pt min assist to elevate trunk when sitting up on edge of bed. She required 2 standing rest breaks during ambulation. Pt back in bed after walk, min assist to lift legs when getting back in bed. VSS throughout.   Pricilla Handler Mobility Specialist Mobility Specialist Phone: [phone]

## 2021-05-06 NOTE — Progress Notes (Signed)
EPW discontinued without difficulty.  Wires intact.  No drainage noted.  V/s monitoring initiated.  Bed rest x 1 hour.  Patient aware.

## 2021-05-07 LAB — GLUCOSE, CAPILLARY
Glucose-Capillary: 108 mg/dL — ABNORMAL HIGH (ref 70–99)
Glucose-Capillary: 170 mg/dL — ABNORMAL HIGH (ref 70–99)
Glucose-Capillary: 206 mg/dL — ABNORMAL HIGH (ref 70–99)
Glucose-Capillary: 193 mg/dL — ABNORMAL HIGH (ref 70–99)

## 2021-05-07 LAB — BASIC METABOLIC PANEL
Anion gap: 9 (ref 5–15)
BUN: 22 mg/dL (ref 8–23)
CO2: 23 mmol/L (ref 22–32)
Calcium: 8.4 mg/dL — ABNORMAL LOW (ref 8.9–10.3)
Chloride: 103 mmol/L (ref 98–111)
Creatinine, Ser: 1.16 mg/dL — ABNORMAL HIGH (ref 0.44–1.00)
GFR, Estimated: 51 mL/min — ABNORMAL LOW (ref >60)
Glucose, Bld: 113 mg/dL — ABNORMAL HIGH (ref 70–99)
Potassium: 4.5 mmol/L (ref 3.5–5.1)
Sodium: 135 mmol/L (ref 135–145)

## 2021-05-07 LAB — MAGNESIUM: Magnesium: 1.8 mg/dL (ref 1.7–2.4)

## 2021-05-07 MED ORDER — FUROSEMIDE 10 MG/ML IJ SOLN: 40.0000 mg | Freq: Once | INTRAMUSCULAR | Status: DC

## 2021-05-07 MED ORDER — MAGNESIUM SULFATE 2 GM/50ML IV SOLN: 2.0000 g | Freq: Once | INTRAVENOUS | Status: DC

## 2021-05-07 MED ORDER — MAGNESIUM SULFATE 4 GM/100ML IV SOLN
4.0000 g | Freq: Once | INTRAVENOUS | Status: AC
  Administered 2021-05-07: 4 g via INTRAVENOUS
  Filled 2021-05-07: qty 100

## 2021-05-07 MED ORDER — FUROSEMIDE 10 MG/ML IJ SOLN: 40.0000 mg | Freq: Two times a day (BID) | INTRAMUSCULAR | Status: DC

## 2021-05-07 MED ORDER — FUROSEMIDE 10 MG/ML IJ SOLN
80.0000 mg | Freq: Two times a day (BID) | INTRAMUSCULAR | Status: AC
  Administered 2021-05-07 – 2021-05-08 (×3): 80 mg via INTRAVENOUS
  Filled 2021-05-07 (×3): qty 8

## 2021-05-07 MED FILL — Heparin Sodium (Porcine) Inj 1000 Unit/ML: INTRAMUSCULAR | Qty: 10 | Status: AC

## 2021-05-07 MED FILL — Electrolyte-R (PH 7.4) Solution: INTRAVENOUS | Qty: 4000 | Status: AC

## 2021-05-07 MED FILL — Mannitol IV Soln 20%: INTRAVENOUS | Qty: 500 | Status: AC

## 2021-05-07 MED FILL — Sodium Chloride IV Soln 0.9%: INTRAVENOUS | Qty: 2000 | Status: AC

## 2021-05-07 MED FILL — Lidocaine HCl Local Soln Prefilled Syringe 100 MG/5ML (2%): INTRAMUSCULAR | Qty: 5 | Status: AC

## 2021-05-07 MED FILL — Sodium Bicarbonate IV Soln 8.4%: INTRAVENOUS | Qty: 50 | Status: AC

## 2021-05-07 NOTE — Progress Notes (Signed)
CARDIAC REHAB PHASE I   PRE:  Rate/Rhythm: 79 SR  BP:  Supine:   Sitting: 169/75  Standing:    SaO2: 97%RA  MODE:  Ambulation: 400 ft   POST:  Rate/Rhythm: 102 ST  BP:  Supine:   Sitting: 172/76  Standing:    SaO2: 99%RA 0958-1034 Pt walked 400 ft on RA with rolling walker. Stopped several times to rest. To recliner after walk with call bell. Using IS and flutter valve.   Graylon Good, RN BSN  05/07/2021 10:30 AM

## 2021-05-07 NOTE — Progress Notes (Signed)
Mobility Specialist: Progress Note   05/07/21 1529  Mobility  Activity Ambulated in hall  Level of Assistance Minimal assist, patient does 75% or more  Assistive Device Front wheel walker  Distance Ambulated (ft) 470 ft (155', 235', then 80')  Mobility Ambulated with assistance in hallway  Mobility Response Tolerated well  Mobility performed by Mobility specialist  Bed Position Chair  $Mobility charge 1 Mobility   Pre-Mobility: 79 HR During Mobility: 94 HR, 98% SpO2 Post-Mobility: 84 HR, 143/68 BP, 100% SpO2  Pt was minA to stand and standby assist during ambulation. Pt stopped for two brief standing breaks due to feeling SOB, sats as seen above. Pt otherwise asx. Pt back to chair after walk with her husband present in the room.   [facility] [place] Mobility Specialist Mobility Specialist Phone: [phone]

## 2021-05-07 NOTE — Progress Notes (Signed)
      [address]       [place] [address]             [phone]      5 Days Post-Op Procedure(s) (LRB): CORONARY ARTERY BYPASS GRAFTING (CABG) X THREE , USING LEFT INTERNAL MAMMARY ARTERY AND RIGHT GREATER SAPHENOUS VEIN HARVESTED ENDOSCOPICALLY (N/A) TRANSESOPHAGEAL ECHOCARDIOGRAM (TEE) (N/A)   Subjective:  Up in chair, getting ready to eat breakfast.  Overall continues to feel better.  + ambulation   + BM  Objective: Vital signs in last 24 hours: Temp:  [97.6 F (36.4 C)-98.3 F (36.8 C)] 98.3 F (36.8 C) (05/11 0326) Pulse Rate:  [60-76] 70 (05/11 0326) Cardiac Rhythm: Normal sinus rhythm (05/11 0130) Resp:  [14-20] 16 (05/11 0326) BP: (109-139)/(54-74) 109/54 (05/11 0326) SpO2:  [94 %-98 %] 97 % (05/11 0326) Weight:  [95.8 kg] 95.8 kg (05/11 0326)  General appearance: alert, cooperative and no distress Heart: regular rate and rhythm Lungs: clear to auscultation bilaterally Abdomen: soft, non-tender; bowel sounds normal; no masses,  no organomegaly Extremities: edema LE trace, + UE Wound: clean and dry  Lab Results: Recent Labs    05/05/21 0257  WBC 11.6*  HGB 11.3*  HCT 34.0*  PLT 156   BMET:  Recent Labs    05/05/21 0257 05/07/21 0137  NA 134* 135  K 4.1 4.5  CL 102 103  CO2 25 23  GLUCOSE 64* 113*  BUN 17 22  CREATININE 0.98 1.16*  CALCIUM 8.2* 8.4*    PT/INR: No results for input(s): LABPROT, INR in the last 72 hours. ABG    Component Value Date/Time   PHART 7.300 (L) 05/02/2021 1740   HCO3 15.4 (L) 05/02/2021 1740   TCO2 16 (L) 05/02/2021 1740   ACIDBASEDEF 10.0 (H) 05/02/2021 1740   O2SAT 97.9 05/03/2021 0257   CBG (last 3)  Recent Labs    05/06/21 1622 05/06/21 2030 05/07/21 0609  GLUCAP 165* 157* 108*    Assessment/Plan: S/P Procedure(s) (LRB): CORONARY ARTERY BYPASS GRAFTING (CABG) X THREE , USING LEFT INTERNAL MAMMARY ARTERY AND RIGHT GREATER SAPHENOUS VEIN HARVESTED ENDOSCOPICALLY (N/A) TRANSESOPHAGEAL  ECHOCARDIOGRAM (TEE) (N/A)  1. CV- Previous A. Fib, has been maintaining NSR for >24 hours- continue Amiodarone, Lopressor, BP remains too labile for Entresto can likely restart at output follow up 2. Pulm- no acute issues, off oxygen, continue IS 3. Renal- creatinine slight bump to 1.16, this is due to aggressive diuretic use, will repeat IV lasix this morning and plan to transition to oral tomorrow, weight in chart is likely not accurate as I doubt she gained 12 lbs overnight, she was weighed in the bed while she was asleep. I have asked nursing to weigh up on the upright scale 4. Hypomagnesemia- 2g Mag Sulfate ordered for replacement 5. DM- sugars remain well controlled 6. Dispo- patient stable, overall doing well, has maintained NSR for > 24 hours.. creatinine slight bump due to aggressive IV diuretics, edema on exam improved, will transition to oral Lasix at discharge, supplement magnesium, patient may be ready for d/c today, will discuss with Dr. Roxy Manns    LOS: 6 days    Ellwood Handler, PA-C 05/07/2021

## 2021-05-07 NOTE — Progress Notes (Signed)
Mobility Specialist - Progress Note   05/07/21 1158  Mobility  Activity Ambulated in hall  Level of Assistance Minimal assist, patient does 75% or more  Assistive Device Front wheel walker  Distance Ambulated (ft) 470 ft (220 x1, 125 x 2)  Mobility Ambulated with assistance in hallway  Mobility Response Tolerated well  Mobility performed by Mobility specialist  $Mobility charge 1 Mobility   Pre-mobility: 75 HR During mobility: 89 HR Post-mobility: 75 HR  Pt required standing rest breaks in the above rest breaks due to 1/4 DOE. Pt min assist to lift legs when laying in bed.   Pricilla Handler Mobility Specialist Mobility Specialist Phone: [phone]

## 2021-05-08 ENCOUNTER — Other Ambulatory Visit (HOSPITAL_COMMUNITY): Payer: Self-pay

## 2021-05-08 LAB — GLUCOSE, CAPILLARY: Glucose-Capillary: 161 mg/dL — ABNORMAL HIGH (ref 70–99)

## 2021-05-08 MED ORDER — AMIODARONE HCL 200 MG PO TABS
200.0000 mg | ORAL_TABLET | Freq: Two times a day (BID) | ORAL | 3 refills | Status: DC
  Filled 2021-05-08: qty 60, 30d supply, fill #0

## 2021-05-08 MED ORDER — TRAMADOL HCL 50 MG PO TABS
50.0000 mg | ORAL_TABLET | ORAL | 0 refills | Status: DC | PRN
  Filled 2021-05-08: qty 30, 4d supply, fill #0

## 2021-05-08 MED ORDER — METOPROLOL TARTRATE 25 MG PO TABS
25.0000 mg | ORAL_TABLET | Freq: Two times a day (BID) | ORAL | 3 refills | Status: DC
Start: 2021-05-08 — End: 2021-09-19
  Filled 2021-05-08: qty 60, 30d supply, fill #0

## 2021-05-08 MED ORDER — FUROSEMIDE 40 MG PO TABS
40.0000 mg | ORAL_TABLET | Freq: Every day | ORAL | 0 refills | Status: DC
  Filled 2021-05-08: qty 7, 7d supply, fill #0

## 2021-05-08 MED ORDER — POTASSIUM CHLORIDE CRYS ER 20 MEQ PO TBCR
20.0000 meq | EXTENDED_RELEASE_TABLET | Freq: Every day | ORAL | 0 refills | Status: DC
  Filled 2021-05-08: qty 7, 7d supply, fill #0

## 2021-05-08 NOTE — TOC Transition Note (Signed)
Transition of Care (TOC) - CM/SW Discharge Note Marvetta Gibbons RN, BSN Transitions of Care Unit 4E- RN Case Manager See Treatment Team for direct phone #    Patient Details  Name: Crystal Fox MRN: [id number] Date of Birth: November 23, 1951  Transition of Care [facility]) CM/SW Contact:  Dawayne Patricia, RN Phone Number: 05/08/2021, 11:12 AM   Clinical Narrative:    Pt stable for transition home today, DME order placed for shower stool. Call made to pt's room to discuss DME need- pt states she does want the shower stool for home, explained that this DME is not covered by insurance and she would need to pay out of pocket for DME. Pt agreeable to this and would like to use in house provider to get prior to leaving hospital.  Spouse to be here around 11am to transport home, and pt states he can take care of cost when he gets here.   Call made to [employer] for DME request- shower stool. DME to be delivered to room prior to discharge.    Final next level of care: Home/Self Care Barriers to Discharge: No Barriers Identified   Patient Goals and CMS Choice Patient states their goals for this hospitalization and ongoing recovery are:: return home CMS Medicare.gov Compare Post Acute Care list provided to:: Patient Choice offered to / list presented to : Patient  Discharge Placement               Home         Discharge Plan and Services   Discharge Planning Services: CM Consult Post Acute Care Choice: Durable Medical Equipment          DME Arranged: Shower stool DME Agency: [employer] Date DME Agency Contacted: 05/08/21 Time DME Agency Contacted: 0900 Representative spoke with at DME Agency: Andee Poles HH Arranged: NA [place] Agency: NA        Social Determinants of Health ([place]) Interventions     Readmission Risk Interventions Readmission Risk Prevention Plan 05/08/2021  Post Dischage Appt Complete  Medication Screening Complete  Transportation Screening Complete  Some recent  data might be hidden

## 2021-05-08 NOTE — Progress Notes (Signed)
      [address]       [place] [address]             [phone]      6 Days Post-Op Procedure(s) (LRB): CORONARY ARTERY BYPASS GRAFTING (CABG) X THREE , USING LEFT INTERNAL MAMMARY ARTERY AND RIGHT GREATER SAPHENOUS VEIN HARVESTED ENDOSCOPICALLY (N/A) TRANSESOPHAGEAL ECHOCARDIOGRAM (TEE) (N/A)   Subjective:  Patient continues to feel good.  She states everything has been great.  + ambulation  + BM  Objective: Vital signs in last 24 hours: Temp:  [97.8 F (36.6 C)-98.6 F (37 C)] 98 F (36.7 C) (05/12 0416) Pulse Rate:  [73-84] 84 (05/12 0416) Cardiac Rhythm: Normal sinus rhythm (05/12 0700) Resp:  [16-19] 16 (05/12 0416) BP: (116-162)/(59-84) 122/59 (05/12 0416) SpO2:  [97 %-100 %] 100 % (05/12 0416) Weight:  [84.9 kg] 84.9 kg (05/12 0416)  Intake/Output from previous day: 05/11 0701 - 05/12 0700 In: 100 [IV Piggyback:100] Out: 2850 [Urine:2850]  General appearance: alert, cooperative and no distress Heart: regular rate and rhythm Lungs: clear to auscultation bilaterally Abdomen: soft, non-tender; bowel sounds normal; no masses,  no organomegaly Extremities: edema trace Wound: clean and dry  Lab Results: No results for input(s): WBC, HGB, HCT, PLT in the last 72 hours. BMET: Recent Labs    05/07/21 0137  NA 135  K 4.5  CL 103  CO2 23  GLUCOSE 113*  BUN 22  CREATININE 1.16*  CALCIUM 8.4*    PT/INR: No results for input(s): LABPROT, INR in the last 72 hours. ABG    Component Value Date/Time   PHART 7.300 (L) 05/02/2021 1740   HCO3 15.4 (L) 05/02/2021 1740   TCO2 16 (L) 05/02/2021 1740   ACIDBASEDEF 10.0 (H) 05/02/2021 1740   O2SAT 97.9 05/03/2021 0257   CBG (last 3)  Recent Labs    05/07/21 1605 05/07/21 2114 05/08/21 0602  GLUCAP 206* 193* 161*    Assessment/Plan: S/P Procedure(s) (LRB): CORONARY ARTERY BYPASS GRAFTING (CABG) X THREE , USING LEFT INTERNAL MAMMARY ARTERY AND RIGHT GREATER SAPHENOUS VEIN HARVESTED  ENDOSCOPICALLY (N/A) TRANSESOPHAGEAL ECHOCARDIOGRAM (TEE) (N/A)  1. CV- PAF, maintaining NSR for > 48 hrs- continue Amiodarone and Lopressor 2. Pulm- no acute issues, continue IS 3. Renal-creatinine had slight increase from diuretics, weight is diuresing well, will repeat IV Lasix this morning, transition to oral Lasix tomorrow 4. DM- sugars controlled 5. Dispo- patient stable, diuresing well, will d/c home today   LOS: 7 days    Ellwood Handler, PA-C 05/08/2021

## 2021-05-08 NOTE — Progress Notes (Signed)
[id number] Pt stated education could be done with her without family present. Discussed sternal precautions and staying in the tube, reviewed walking for ex, encouraged IS and flutter valve, discussed low carb and heart healthy food choices, wound care. Pt voiced understanding. Pt stated she would prefer CRP 2 here in [place] . Will refer to [place]. Graylon Good RN BSN 05/08/2021 10:05 AM

## 2021-05-12 ENCOUNTER — Telehealth (HOSPITAL_COMMUNITY): Payer: Self-pay

## 2021-05-12 ENCOUNTER — Telehealth: Payer: Self-pay

## 2021-05-12 NOTE — Telephone Encounter (Signed)
Patient contacted the office after hours and spoke with Dr. Orvan Seen about no appetite, diarrhea, and depression.  She is s/p CABG with Dr. Roxy Manns 05/02/21 and was recently discharged from the hospital last Thursday, 06-01-21 and her husband passed away unexpectedly 27-Apr-2023, 05/17/21. She stated that she just wanted reassurance that everything is "going to be ok". She stated that she is feeling much better and she attributed her stomach problems with stress and lack of food while taking her prescription medications.  She was asking if she would be ok to start medication for anxiety/ depression with medications that she was recently prescribed.  Advised patient to contact her PCP, Dr. Reynaldo Minium for appointment for possible medication management during this stressful time. She acknowledged receipt and was appreciative of the return call.

## 2021-05-12 NOTE — Telephone Encounter (Signed)
Called patient to see if she is interested in the Cardiac Rehab Program. Patient expressed interest. Explained scheduling process and went over insurance, patient verbalized understanding. Will contact patient for scheduling once f/u has been completed. 

## 2021-05-13 ENCOUNTER — Telehealth: Payer: Self-pay | Admitting: Cardiology

## 2021-05-13 NOTE — Telephone Encounter (Signed)
Spoke with pt, she called to let us know that her husband, Jenny Reichmann had passed away.

## 2021-05-13 NOTE — Telephone Encounter (Signed)
Pt is calling requesting to speak with Crystal Fox. She did not specify what it was in regards to and stated she would rather tell her, but it is something important. Please advise.

## 2021-05-19 NOTE — Progress Notes (Signed)
Office Visit    Patient Name: ANNALYSE LANGLAIS Date of Encounter: 05/19/2021  PCP:  Burnard Bunting, MD   [place]  Cardiologist:  Kirk Ruths, MD  Advanced Practice Provider:  No care team member to display Electrophysiologist:  None   Chief Complaint    Crystal Fox is a 70 y.o. female with a hx of CAD s/p CABGx3 04/2021, HLD, DM2, HFrEF, palpitations, HTN presents today for follow up after CABG.   Past Medical History    Past Medical History:  Diagnosis Date  . Diabetes mellitus without complication ([facility])    AODM  . Fibroid   . Glaucoma   . Hyperlipidemia   . Hypertension   . Osteoarthritis    -both knees, right foot  . Plantar fasciitis 2009  . 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   LIMA to LAD, SVG to ramus intermediate, SVG to PDA, EVH via right thigh   Past Surgical History:  Procedure Laterality Date  . bladder tack    . BREAST SURGERY  1973   -benign Lt.breast bx  . CHOLECYSTECTOMY OPEN  1985  . CORONARY ARTERY BYPASS GRAFT N/A 05/02/2021   Procedure: CORONARY ARTERY BYPASS GRAFTING (CABG) X THREE , USING LEFT INTERNAL MAMMARY ARTERY AND RIGHT GREATER SAPHENOUS VEIN HARVESTED ENDOSCOPICALLY;  Surgeon: Rexene Alberts, MD;  Location: [facility];  Service: Open Heart Surgery;  Laterality: N/A;  . LEFT HEART CATH AND CORONARY ANGIOGRAPHY N/A 05/01/2021   Procedure: LEFT HEART CATH AND CORONARY ANGIOGRAPHY;  Surgeon: Burnell Blanks, MD;  Location: [facility];  Service: Cardiovascular;  Laterality: N/A;  . TEE WITHOUT CARDIOVERSION N/A 05/02/2021   Procedure: TRANSESOPHAGEAL ECHOCARDIOGRAM (TEE);  Surgeon: Rexene Alberts, MD;  Location: [facility];  Service: Open Heart Surgery;  Laterality: N/A;    Allergies  Allergies  Allergen Reactions  . Sulfa Antibiotics Rash    History of Present Illness    Crystal Fox is a 70 y.o. female with a hx of CAD s/p CABGx3 04/2021, HLD, DM2, HFrEF, palpitations, HTN last seen while  hospitalized.  She was referred to Dr. Stanford Breed due to palpitations. Underwent echocardiogram 03/2021 with LVEF 30-35% with akinesis of mid distal anteroseptal wall and apex, grade 1 diastolic dysfunction, mild MR, mild TR. She underwent cardiac CTA with significantly elevated calcium score of 1085. Cardiac catheterization performed 05/01/21 showed severe multivessel CAD and chronic occlusion of LAD. TCTS was consulted and agreed to CABG. On 05/02/21 she underwent CABGx3 LIMA-LAD, SVG-ramus intermedius, SVG-PDA. She did have post operative atrial fibrillation treated with Amiodarone drip and converted. Atrial fibrillation did not recur. Her home Delene Loll was unable to be resumed due to labile blood pressure.   She presents today for follow up with her daughter.  Since hospitalization she has been walking at home 8 minutes 2 or 3 times per day.  Notes her edema in her arms and hands is resolved with 1 week of diuretic course though she has persistent swelling her feet.  She does try to sit with her legs elevated and has been following a overall low-salt diet.  Does note that since she was hospitalized her husband has passed and offered my condolences.  Tells me she has good family and church support.  She reports no chest pain, pressure, tightness.  Reports her dyspnea on exertion is overall improving.  No palpitations, orthopnea, PND.  Does note dry mouth at night which resolves with Biotene.  EKGs/Labs/Other Studies Reviewed:  The following studies were reviewed today:  LHC 05/01/21  Prox RCA to Mid RCA lesion is 50% stenosed.  RPAV lesion is 40% stenosed.  Mid RCA lesion is 95% stenosed.  Ramus lesion is 99% stenosed.  Mid Cx lesion is 60% stenosed.  Prox LAD to Mid LAD lesion is 100% stenosed.   1. Chronic occlusion mid LAD with faint collateral filling from left to left collaterals 2. Severe stenosis in the small to moderate caliber ramus intermediate branch 3. Moderate stenosis in the mid  Circumflex 4. Severe stenosis in the mid segment of the large, dominant RCA. The proximal and mid vessel is calcified.    Recommendations: She will need CT surgery consultation for CABG given multi-vessel CAD, CTO of the LAD and history of diabetes. She is asymptomatic at this time but she does have LV systolic dysfunction. I think revascularization is indicated. We have discussed discharge home and outpatient CT surgery consultation but she would like to remain in the hospital until she meets with the surgeon. Will arrange a telemetry bed.     Echo 04/23/21  1. Left ventricular ejection fraction, by estimation, is 30 to 35%. The  left ventricle has moderately decreased function. The left ventricle  demonstrates regional wall motion abnormalities (see scoring  diagram/findings for description). Left ventricular   diastolic parameters are consistent with Grade I diastolic dysfunction  (impaired relaxation).   2. Right ventricular systolic function is normal. The right ventricular  size is normal. There is normal pulmonary artery systolic pressure. The  estimated right ventricular systolic pressure is 78.2 mmHg.   3. The mitral valve is normal in structure. Mild mitral valve  regurgitation. No evidence of mitral stenosis.   4. The aortic valve is grossly normal. Aortic valve regurgitation is not  visualized. No aortic stenosis is present.   5. The inferior vena cava is normal in size with greater than 50%  respiratory variability, suggesting right atrial pressure of 3 mmHg.   Conclusion(s)/Recommendation(s): No left ventricular mural or apical  thrombus/thrombi.    EKG:  EKG is ordered today.  The ekg ordered today demonstrates normal sinus rhythm 70 bpm with stable inferior Q waves, low voltage QRS, and no acute ST/T wave changes.  Recent Labs: 05/05/2021: Hemoglobin 11.3; Platelets 156 05/07/2021: BUN 22; Creatinine, Ser 1.16; Magnesium 1.8; Potassium 4.5; Sodium 135  Recent Lipid  Panel No results found for: CHOL, TRIG, HDL, CHOLHDL, VLDL, LDLCALC, LDLDIRECT  Home Medications   No outpatient medications have been marked as taking for the 05/21/21 encounter (Appointment) with Loel Dubonnet, NP.   Current Facility-Administered Medications for the 05/21/21 encounter (Appointment) with Loel Dubonnet, NP  Medication  . sodium chloride flush (NS) 0.9 % injection 3 mL     Review of Systems   All other systems reviewed and are otherwise negative except as noted above.  Physical Exam    VS:  LMP 12/28/2004 (Approximate)  , BMI There is no height or weight on file to calculate BMI.  Wt Readings from Last 3 Encounters:  05/08/21 187 lb 1.6 oz (84.9 kg)  04/29/21 178 lb (80.7 kg)  03/24/21 183 lb (83 kg)     GEN: Well nourished, well developed, in no acute distress. HEENT: normal. Neck: Supple, no JVD, carotid bruits, or masses. Cardiac: RRR, no murmurs, rubs, or gallops. No clubbing, cyanosis. 2+  Bilateral pedal edema.   Radials/DP/PT 2+ and equal bilaterally.  Respiratory:  Respirations regular and unlabored, clear to auscultation bilaterally. GI: Soft,  nontender, nondistended. MS: No deformity or atrophy. Skin: Warm and dry, no rash.  Midsternal incision clean, dry, intact with well approximated edges.  Chest tube suture sites with superficial wound dehiscence, serous drainage, no signs of infection. Neuro:  Strength and sensation are intact. Psych: Normal affect.  Assessment & Plan    1. CAD s/p CABGx3 - Midsternal incision healing appropriately.  Chest tube sites with some superficial wound dehiscence with serous drainage, cleaned and Steri-Strips replaced.  Reports no chest pain, pressure, tightness.  GDMT includes aspirin, atorvastatin, metoprolol.  Resume Lasix, as below.  Encouraged to participate in cardiac rehab. Heart healthy diet and regular cardiovascular exercise encouraged.   2. Postoperative atrial fibrillation -managed with amiodarone while  hospitalized.  Continue amiodarone 200 mg daily in the outpatient setting.  Could consider discontinuation in approximately 3 months.  No evidence of recurrent atrial fibrillation by EKG today.  No indication for anticoagulation.  If atrial fibrillation recurs she would require anticoagulation due to CHA2DS2-VASc of at least 59 (age, gender, HF, CAD).  3. HFrEF / ICM - 04/23/21 LVEF 30-35% with subsequent CABG. GDMT includes Metoprolol. ARB/ARNI deferred during recent hospitalization due to normal blood pressure.  Noted dyspnea on exertion and lower extremity edema.  Plan to resume Lasix 40 mg daily and potassium 20 mill equivalent daily.  She will utilize potassium 10 mill equivalent tablets and take 2 daily due to pill size. CMP, CBC today and repeat BMP in 1-2 weeks. Leg elevation, compression stockings, low-salt diet, less than 2 L fluid restriction encouraged. Pending blood pressure and volume response could consider addition of ARB/ARNI vs Spironolactone at follow up. Plan for echo 3 months after maximum medical therapy reached to reassess LVEF.  4. HLD, LDL goal <70 - 02/26/21 LDL 79.  Atorvastatin 80 mg daily initiated while hospitalized.  Plan to repeat lipid panel, CMP at follow-up.  5. HTN - BP well controlled. Continue current antihypertensive regimen. Will not add ARB/ARNI at this time as we are resuming Lasix, as above, and do not want to cause hypotension.  Could be considered at follow-up.  6. DM2 - 05/02/21 A1c 6.7. Continue to follow with PCP.  Disposition: Follow up in 6 week(s) with Dr. Stanford Breed or APP   Signed, Loel Dubonnet, NP 05/19/2021, 9:06 AM [place]

## 2021-05-21 ENCOUNTER — Encounter: Payer: Self-pay | Admitting: Family

## 2021-05-21 ENCOUNTER — Other Ambulatory Visit: Payer: Self-pay

## 2021-05-21 ENCOUNTER — Ambulatory Visit: Payer: PPO | Admitting: Family

## 2021-05-21 VITALS — BP 128/72 | HR 70 | Ht 63.0 in | Wt 186.8 lb

## 2021-05-21 DIAGNOSIS — Z79899 Other long term (current) drug therapy: Secondary | ICD-10-CM | POA: Diagnosis not present

## 2021-05-21 DIAGNOSIS — E785 Hyperlipidemia, unspecified: Secondary | ICD-10-CM

## 2021-05-21 DIAGNOSIS — I25118 Atherosclerotic heart disease of native coronary artery with other forms of angina pectoris: Secondary | ICD-10-CM

## 2021-05-21 DIAGNOSIS — I255 Ischemic cardiomyopathy: Secondary | ICD-10-CM | POA: Diagnosis not present

## 2021-05-21 DIAGNOSIS — Z951 Presence of aortocoronary bypass graft: Secondary | ICD-10-CM

## 2021-05-21 DIAGNOSIS — I5020 Unspecified systolic (congestive) heart failure: Secondary | ICD-10-CM

## 2021-05-21 LAB — CBC
Hematocrit: 33 % — ABNORMAL LOW (ref 34.0–46.6)
Hemoglobin: 10.7 g/dL — ABNORMAL LOW (ref 11.1–15.9)
MCH: 27.5 pg (ref 26.6–33.0)
MCHC: 32.4 g/dL (ref 31.5–35.7)
MCV: 85 fL (ref 79–97)
Platelets: 446 10*3/uL (ref 150–450)
RBC: 3.89 x10E6/uL (ref 3.77–5.28)
RDW: 15.6 % — ABNORMAL HIGH (ref 11.7–15.4)
WBC: 9.3 10*3/uL (ref 3.4–10.8)

## 2021-05-21 LAB — COMPREHENSIVE METABOLIC PANEL
ALT: 25 IU/L (ref 0–32)
AST: 17 IU/L (ref 0–40)
Albumin/Globulin Ratio: 1.4 (ref 1.2–2.2)
Albumin: 3.9 g/dL (ref 3.8–4.8)
Alkaline Phosphatase: 157 IU/L — ABNORMAL HIGH (ref 44–121)
BUN/Creatinine Ratio: 16 (ref 12–28)
BUN: 19 mg/dL (ref 8–27)
Bilirubin Total: 0.3 mg/dL (ref 0.0–1.2)
CO2: 17 mmol/L — ABNORMAL LOW (ref 20–29)
Calcium: 9.5 mg/dL (ref 8.7–10.3)
Chloride: 102 mmol/L (ref 96–106)
Creatinine, Ser: 1.21 mg/dL — ABNORMAL HIGH (ref 0.57–1.00)
Globulin, Total: 2.7 g/dL (ref 1.5–4.5)
Glucose: 113 mg/dL — ABNORMAL HIGH (ref 65–99)
Potassium: 5.3 mmol/L — ABNORMAL HIGH (ref 3.5–5.2)
Sodium: 139 mmol/L (ref 134–144)
Total Protein: 6.6 g/dL (ref 6.0–8.5)
eGFR: 49 mL/min/{1.73_m2} — ABNORMAL LOW (ref >59)

## 2021-05-21 MED ORDER — POTASSIUM CHLORIDE CRYS ER 10 MEQ PO TBCR: 20.0000 meq | EXTENDED_RELEASE_TABLET | Freq: Every day | ORAL | 2 refills | Status: DC

## 2021-05-21 MED ORDER — FUROSEMIDE 40 MG PO TABS: 40.0000 mg | ORAL_TABLET | Freq: Every day | ORAL | 2 refills | Status: DC

## 2021-05-21 NOTE — Patient Instructions (Addendum)
Medication Instructions:  Your physician has recommended you make the following change in your medication:   START Lasix 40mg  daily START Potassium 82mEq daily  *If you need a refill on your cardiac medications before your next appointment, please call your pharmacy*   Lab Work: Your provider recommends that you return for lab work today: [place], CBC  Your physician recommends that you return for lab work in 1-2 weeks for BMP.  If you have labs (blood work) drawn today and your tests are completely normal, you will receive your results only by: Marland Kitchen MyChart Message (if you have MyChart) OR . A paper copy in the mail If you have any lab test that is abnormal or we need to change your treatment, we will call you to review the results.   Testing/Procedures: Your EKG today showed normal sinus rhythm.    Follow-Up: At [facility], you and your health needs are our priority.  As part of our continuing mission to provide you with exceptional heart care, we have created designated Provider Care Teams.  These Care Teams include your primary Cardiologist (physician) and Advanced Practice Providers (APPs -  Physician Assistants and Nurse Practitioners) who all work together to provide you with the care you need, when you need it.  We recommend signing up for the patient portal called "MyChart".  Sign up information is provided on this After Visit Summary.  MyChart is used to connect with patients for Virtual Visits (Telemedicine).  Patients are able to view lab/test results, encounter notes, upcoming appointments, etc.  Non-urgent messages can be sent to your provider as well.   To learn more about what you can do with MyChart, go to [email or web address].    Your next appointment:   6 week(s)  The format for your next appointment:   In Person  Provider:   You may see Kirk Ruths, MD or one of the following Advanced Practice Providers on your designated Care Team:    [place],  PA-C  Coletta Memos, FNP  Other Instructions  Heart Healthy Diet Recommendations: A low-salt diet is recommended. Meats should be grilled, baked, or boiled. Avoid fried foods. Focus on lean protein sources like fish or chicken with vegetables and fruits. The American Heart Association is a Microbiologist!  American Heart Association Diet and Lifeystyle Recommendations   Exercise recommendations: The American Heart Association recommends 150 minutes of moderate intensity exercise weekly. Try 30 minutes of moderate intensity exercise 4-5 times per week. This could include walking, jogging, or swimming.  To prevent or reduce lower extremity swelling: . Eat a low salt diet. Salt makes the body hold onto extra fluid which causes swelling. . Sit with legs elevated. For example, in the recliner or on an [place].  . Wear knee-high compression stockings during the daytime. Ones labeled 15-20 mmHg provide good compression.   Tips to Measure your Blood Pressure Correctly  Recommend checking your blood pressure about twice per week and keeping a log. Contact our office if your blood pressure is consistently more than 130/80.   Here's what you can do to ensure a correct reading: . Don't drink a caffeinated beverage or smoke during the 30 minutes before the test. . Sit quietly for five minutes before the test begins. . During the measurement, sit in a chair with your feet on the floor and your arm supported so your elbow is at about heart level. . The inflatable part of the cuff should completely cover at least 80% of your  upper arm, and the cuff should be placed on bare skin, not over a shirt. . Don't talk during the measurement. . Have your blood pressure measured twice, with a brief break in between. If the readings are different by 5 points or more, have it done a third time.   Blood pressure categories  Blood pressure category SYSTOLIC (upper number)  DIASTOLIC (lower number)  Normal Less  than 120 mm Hg and Less than 80 mm Hg  Elevated 120-129 mm Hg and Less than 80 mm Hg  High blood pressure: Stage 1 hypertension 130-139 mm Hg or 80-89 mm Hg  High blood pressure: Stage 2 hypertension 140 mm Hg or higher or 90 mm Hg or higher  Hypertensive crisis (consult your doctor immediately) Higher than 180 mm Hg and/or Higher than 120 mm Hg  Source: American Heart Association and American Stroke Association. For more on getting your blood pressure under control, buy Controlling Your Blood Pressure, a Special Health Report from [facility].   Blood Pressure Log   Date   Time  Blood Pressure  Position  Example: Nov 1 9 AM 124/78 sitting

## 2021-05-23 ENCOUNTER — Other Ambulatory Visit: Payer: Self-pay

## 2021-05-23 DIAGNOSIS — Z79899 Other long term (current) drug therapy: Secondary | ICD-10-CM

## 2021-05-23 MED ORDER — FUROSEMIDE 20 MG PO TABS: 20.0000 mg | ORAL_TABLET | Freq: Every day | ORAL | 2 refills | Status: DC

## 2021-05-28 DIAGNOSIS — Z79899 Other long term (current) drug therapy: Secondary | ICD-10-CM | POA: Diagnosis not present

## 2021-05-28 LAB — BASIC METABOLIC PANEL
BUN/Creatinine Ratio: 13 (ref 12–28)
BUN: 17 mg/dL (ref 8–27)
CO2: 22 mmol/L (ref 20–29)
Calcium: 10.4 mg/dL — ABNORMAL HIGH (ref 8.7–10.3)
Chloride: 100 mmol/L (ref 96–106)
Creatinine, Ser: 1.35 mg/dL — ABNORMAL HIGH (ref 0.57–1.00)
Glucose: 107 mg/dL — ABNORMAL HIGH (ref 65–99)
Potassium: 5.3 mmol/L — ABNORMAL HIGH (ref 3.5–5.2)
Sodium: 141 mmol/L (ref 134–144)
eGFR: 43 mL/min/{1.73_m2} — ABNORMAL LOW (ref >59)

## 2021-05-30 ENCOUNTER — Other Ambulatory Visit: Payer: Self-pay

## 2021-05-30 ENCOUNTER — Other Ambulatory Visit: Payer: Self-pay | Admitting: Thoracic Surgery (Cardiothoracic Vascular Surgery)

## 2021-05-30 DIAGNOSIS — Z951 Presence of aortocoronary bypass graft: Secondary | ICD-10-CM

## 2021-05-30 DIAGNOSIS — E875 Hyperkalemia: Secondary | ICD-10-CM

## 2021-05-30 DIAGNOSIS — I10 Essential (primary) hypertension: Secondary | ICD-10-CM

## 2021-05-30 DIAGNOSIS — Z79899 Other long term (current) drug therapy: Secondary | ICD-10-CM

## 2021-06-02 ENCOUNTER — Ambulatory Visit (INDEPENDENT_AMBULATORY_CARE_PROVIDER_SITE_OTHER): Payer: Self-pay | Admitting: Physician Assistant

## 2021-06-02 ENCOUNTER — Ambulatory Visit
Admission: RE | Admit: 2021-06-02 | Discharge: 2021-06-02 | Disposition: A | Discharge status: Home / Self Care | Payer: PPO | Source: Ambulatory Visit | Attending: Thoracic Surgery (Cardiothoracic Vascular Surgery) | Admitting: Thoracic Surgery (Cardiothoracic Vascular Surgery)

## 2021-06-02 ENCOUNTER — Other Ambulatory Visit: Payer: Self-pay

## 2021-06-02 VITALS — BP 139/68 | HR 83 | Resp 20 | Ht 63.0 in | Wt 175.0 lb

## 2021-06-02 DIAGNOSIS — Z951 Presence of aortocoronary bypass graft: Secondary | ICD-10-CM

## 2021-06-02 DIAGNOSIS — J90 Pleural effusion, not elsewhere classified: Secondary | ICD-10-CM | POA: Diagnosis not present

## 2021-06-02 NOTE — Patient Instructions (Signed)
May resume driving  Dress the chest tube sites with a saline-moistened gauze covered with a dry gauze and tape until healed.   May relax sternal precautions to allow lifting 10 lbs or less.

## 2021-06-02 NOTE — Telephone Encounter (Signed)
Pt called wanting to schedule for cardiac rehab, I advised pt of the backlog and that we would give her a call between 1-3 months to schedule. Pt understood.

## 2021-06-02 NOTE — Progress Notes (Signed)
HPI:  Patient returns for routine postoperative follow-up having undergone CABG x 3 by Dr. Roxy Manns on 05/02/21. The patient's early postoperative recovery while in the hospital was notable for atrial fibrillation that converted to NSR after amiodarone load.  Since hospital discharge the patient reports she has continued to progress with increasing stamina.  She denies shortness of breath, palpitations, or chest pain. She has not required any pain mediation since discharge.  She related that her husband of over 30 yerars passed away two days after her discharge. She has had friends and family helping her in her home since then.     Current Outpatient Medications  Medication Sig Dispense Refill  . ACCU-CHEK AVIVA PLUS test strip 1 each by Other route every morning.    . Accu-Chek FastClix Lancets MISC use to self monitor blood glucose four to six times per day    . ALPRAZolam (XANAX) 0.25 MG tablet Take 0.25 mg by mouth every 6 (six) hours as needed.    Marland Kitchen amiodarone (PACERONE) 200 MG tablet Take 1 tablet (200 mg total) by mouth 2 (two) times daily. 60 tablet 3  . Ascorbic Acid (VITAMIN C GUMMIES PO) Take 250 mg by mouth in the morning.    Marland Kitchen aspirin 81 MG chewable tablet Chew 81 mg by mouth in the morning.    Marland Kitchen atorvastatin (LIPITOR) 80 MG tablet Take 1 tablet (80 mg total) by mouth daily. (Patient taking differently: Take 80 mg by mouth at bedtime.) 90 tablet 3  . BD PEN NEEDLE NANO U/F 32G X 4 MM MISC     . Calcium Carb-Cholecalciferol (CALCIUM 500 + D3 PO) Take 1 tablet by mouth daily at 2 PM.    . Coenzyme Q10 (COQ-10) 100 MG CAPS Take 100 mg by mouth every evening.    . Estradiol (YUVAFEM) 10 MCG TABS vaginal tablet PLACE 1 TABLET (10 MCG TOTAL) VAGINALLY 2 (TWO) TIMES A WEEK. (Patient taking differently: Place 10 mcg vaginally 2 (two) times a week. Tuesdays & Fridays) 24 tablet 3  . furosemide (LASIX) 20 MG tablet Take 1 tablet (20 mg total) by mouth daily. 30 tablet 2  . INVOKANA 300 MG TABS  Take 300 mg by mouth in the morning.    . latanoprost (XALATAN) 0.005 % ophthalmic solution Place 1 drop into both eyes at bedtime.    Marland Kitchen loratadine (CLARITIN) 10 MG tablet Take 10 mg by mouth in the morning.    . metFORMIN (GLUCOPHAGE-XR) 500 MG 24 hr tablet Take 1,000 mg by mouth 2 (two) times daily with a meal.    . metoprolol tartrate (LOPRESSOR) 25 MG tablet Take 1 tablet (25 mg total) by mouth 2 (two) times daily. 60 tablet 3  . Multiple Vitamin (MULTIVITAMIN WITH MINERALS) TABS tablet Take 1 tablet by mouth daily at 2 PM. Multivitamin w/Omega-3    . traMADol (ULTRAM) 50 MG tablet Take 1-2 tablets (50-100 mg total) by mouth every 4 (four) hours as needed for moderate pain. 30 tablet 0  . TRESIBA FLEXTOUCH 200 UNIT/ML SOPN Inject 26 Units into the skin at bedtime.  3  . TRULICITY 1.5 MB/8.4YK SOPN Inject 1.5 mg into the skin every Wednesday.  3  . TURMERIC-GINGER PO Take 1 capsule by mouth daily in the afternoon.     Current Facility-Administered Medications  Medication Dose Route Frequency Provider Last Rate Last Admin  . sodium chloride flush (NS) 0.9 % injection 3 mL  3 mL Intravenous Q12H Crenshaw, Denice Bors, MD  Physical Exam   VS: BP 139/68 P 83 RR 20 SaO2 97%  Heart: RRR Chest: Breath sounds are clear.  Wounds: the sternotomy incision is intact and healing with no sign of complication. The Chest tube exit sites are open but are clean and granulating. The RLE EVH incision is intact and dry.  Exts: Mild edema in both feet, no significant pre-tibial edema.    Diagnostic Tests:  EXAM: CHEST - 2 VIEW  COMPARISON: May 05, 2021  FINDINGS: There is a small left pleural effusion. No edema or airspace opacity. Heart is upper normal in size with pulmonary vascularity normal. Patient is status post coronary artery bypass grafting. No adenopathy. No pneumothorax. No bone lesions.  IMPRESSION: Small left pleural effusion. Lungs otherwise clear. Heart upper normal in size  with changes of coronary artery bypass grafting. No adenopathy. No pneumothorax.   Electronically Signed By: Lowella Grip III M.D. On: 06/02/2021 13:04   Impression / Plan:  -1 month s/p CABG x 3-  Progressing well since discharge despite loosing her husband suddenly after returning home. She is ready to increase her exercise regimen but said she was notified that a cardiac rehab slot would not be available for another month. Sternal precautions were reviewed. She would like to resume driving locally and I think this is appropriate.   -Post-op atrial fibrillation- No known recurrence- OK to decrease the amiodarone to 200mg  once daily.  -She has scheduled follow up with cardiology and repeat  ECHO has been discussed. No further follow up is scheduled with out practice but I encouraged her to contact us if we may assist further in her care.    Antony Odea, PA-C [facility] [phone]

## 2021-06-03 ENCOUNTER — Other Ambulatory Visit (HOSPITAL_COMMUNITY): Payer: Self-pay

## 2021-06-18 ENCOUNTER — Telehealth (HOSPITAL_COMMUNITY): Payer: Self-pay

## 2021-06-18 NOTE — Telephone Encounter (Signed)
Pt insurance is active and benefits verified through [employer]. Co-pay $15, DED 0/0 met, out of pocket $3,450/$2,195 met, co-insurance 0%. no pre-authorization required. [place]/[employer], REF# [id number]  Will contact patient to see if she is interested in the Cardiac Rehab Program. If interested, patient will need to complete follow up appt. Once completed, patient will be contacted for scheduling upon review by the RN Navigator.

## 2021-06-18 NOTE — Telephone Encounter (Signed)
Called patient to see if she was interested in participating in the Cardiac Rehab Program. Patient stated yes. Patient will come in for orientation on 07/10/2021@8 :00am and will attend the 11:15am exercise class.   Tourist information centre manager.

## 2021-07-01 NOTE — Progress Notes (Signed)
Cardiology Clinic Note   Patient Name: Crystal Fox Date of Encounter: 07/03/2021  Primary Care Provider:  Burnard Bunting, MD Primary Cardiologist:  Kirk Ruths, MD  Patient Profile    Crystal Fox 70 year old female presents the clinic today for follow-up evaluation of her coronary artery disease status post CABG x3 5/22.  Past Medical History    Past Medical History:  Diagnosis Date   Diabetes mellitus without complication ([place])    AODM   Fibroid    Glaucoma    Hyperlipidemia    Hypertension    Osteoarthritis    -both knees, right foot   Plantar fasciitis 2009   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   LIMA to LAD, SVG to ramus intermediate, SVG to PDA, EVH via right thigh   Past Surgical History:  Procedure Laterality Date   bladder tack     BREAST SURGERY  1973   -benign Lt.breast bx   CHOLECYSTECTOMY OPEN  1985   CORONARY ARTERY BYPASS GRAFT N/A 05/02/2021   Procedure: CORONARY ARTERY BYPASS GRAFTING (CABG) X THREE , USING LEFT INTERNAL MAMMARY ARTERY AND RIGHT GREATER SAPHENOUS VEIN HARVESTED ENDOSCOPICALLY;  Surgeon: Rexene Alberts, MD;  Location: [facility];  Service: Open Heart Surgery;  Laterality: N/A;   LEFT HEART CATH AND CORONARY ANGIOGRAPHY N/A 05/01/2021   Procedure: LEFT HEART CATH AND CORONARY ANGIOGRAPHY;  Surgeon: Burnell Blanks, MD;  Location: [facility];  Service: Cardiovascular;  Laterality: N/A;   TEE WITHOUT CARDIOVERSION N/A 05/02/2021   Procedure: TRANSESOPHAGEAL ECHOCARDIOGRAM (TEE);  Surgeon: Rexene Alberts, MD;  Location: [facility];  Service: Open Heart Surgery;  Laterality: N/A;    Allergies  Allergies  Allergen Reactions   Sulfa Antibiotics Rash    History of Present Illness    Crystal Fox she has a PMH of coronary artery disease status post CABG x3 5/22, HLD, diabetes mellitus type 2, HFrEF, palpitations, and essential hypertension.  She was seen by Dr. Stanford Breed for palpitations.  She underwent echo 4/22 which showed LVEF  30-35%, mild distal anterior septal wall and apex, G1 DD, mild MR, mild TR.  She underwent cardiac CTA which showed significantly elevated calcium score of 1085.  She underwent cardiac catheterization 05/01/2021 which showed severe multivessel coronary artery disease with chronic occlusion of LAD.  She underwent CABG 05/02/2021 with three-vessel bypass LIMA-LAD, SVG-ramus intermedius, SVG-PDA.  She was noted to have postoperative atrial fibrillation.  She was treated with amiodarone and converted to sinus rhythm.  Her atrial fibrillation did not recur.  She was not able to resume her Entresto due to labile blood pressures.  She follows up with [name], NP-C on 05/21/2021.  During that time she presented with her daughter.  She reported that she has been slowly increasing her physical activity.  She was elevating her lower extremities and following a low-sodium diet.  Her husband passed away while she was hospitalized.  She reported good family and church support network.  She denied chest pain, pressure, tightness.  She reported dyspnea on exertion which was improving.  She denied palpitations, orthopnea, and PND.  She noted a dry mouth at night but was having some relief with Biotene.  She presents the clinic today for follow-up evaluation states she continues to increase her physical activity.  She was started cardiac rehab on the 18th.  She is currently walking at home by doing well with increased physical activity.  She reports that her husband passed away shortly after she  returned home from the hospital and that was unexpected.  She continues to report a good support network.  We reviewed her angiography, CABG, postoperative atrial fibrillation, and medications.  I will reduce her amiodarone to 200 mg daily and order a cardiac event monitor in 1 month to verify no atrial fibrillation.  We will give her the salty 6 diet sheet, order BMP, fasting lipids and LFTs.  We will have her follow-up with Dr.  Stanford Breed scheduled.  Today she denies chest pain, shortness of breath, lower extremity edema, fatigue, palpitations, melena, hematuria, hemoptysis, diaphoresis, weakness, presyncope, syncope, orthopnea, and PND.   Home Medications    Prior to Admission medications   Medication Sig Start Date End Date Taking? Authorizing Provider  ACCU-CHEK AVIVA PLUS test strip 1 each by Other route every morning. 08/22/13   [provider]  Accu-Chek FastClix Lancets MISC use to self monitor blood glucose four to six times per day 03/29/13   [provider]  ALPRAZolam Duanne Moron) 0.25 MG tablet Take 0.25 mg by mouth every 6 (six) hours as needed. 05/12/21   [provider]  amiodarone (PACERONE) 200 MG tablet Take 1 tablet (200 mg total) by mouth 2 (two) times daily. 05/08/21   Barrett, Lodema Hong, PA-C  Ascorbic Acid (VITAMIN C GUMMIES PO) Take 250 mg by mouth in the morning.    [provider]  aspirin 81 MG chewable tablet Chew 81 mg by mouth in the morning.    [provider]  atorvastatin (LIPITOR) 80 MG tablet Take 1 tablet (80 mg total) by mouth daily. Patient taking differently: Take 80 mg by mouth at bedtime. 04/24/21   Lelon Perla, MD  BD PEN NEEDLE NANO U/F 32G X 4 MM MISC  09/10/18   [provider]  Calcium Carb-Cholecalciferol (CALCIUM 500 + D3 PO) Take 1 tablet by mouth daily at 2 PM.    [provider]  Coenzyme Q10 (COQ-10) 100 MG CAPS Take 100 mg by mouth every evening.    [provider]  Estradiol (YUVAFEM) 10 MCG TABS vaginal tablet PLACE 1 TABLET (10 MCG TOTAL) VAGINALLY 2 (TWO) TIMES A WEEK. Patient taking differently: Place 10 mcg vaginally 2 (two) times a week. Tuesdays & Fridays 11/13/20   Nunzio Cobbs, MD  furosemide (LASIX) 20 MG tablet Take 1 tablet (20 mg total) by mouth daily. 05/23/21 08/21/21  Loel Dubonnet, NP  INVOKANA 300 MG TABS Take 300 mg by mouth in the morning. 08/25/13   [provider]  latanoprost (XALATAN) 0.005 % ophthalmic solution Place 1 drop into both eyes at bedtime. 09/18/13   [provider]  loratadine (CLARITIN) 10 MG tablet Take 10 mg by mouth in the morning.    [provider]  metFORMIN (GLUCOPHAGE-XR) 500 MG 24 hr tablet Take 1,000 mg by mouth 2 (two) times daily with a meal. 06/18/15   [provider]  metoprolol tartrate (LOPRESSOR) 25 MG tablet Take 1 tablet (25 mg total) by mouth 2 (two) times daily. 05/08/21   Barrett, Lodema Hong, PA-C  Multiple Vitamin (MULTIVITAMIN WITH MINERALS) TABS tablet Take 1 tablet by mouth daily at 2 PM. Multivitamin w/Omega-3    [provider]  traMADol (ULTRAM) 50 MG tablet Take 1-2 tablets (50-100 mg total) by mouth every 4 (four) hours as needed for moderate pain. 05/08/21   Barrett, Erin R, PA-C  TRESIBA FLEXTOUCH 200 UNIT/ML SOPN Inject 26 Units into the skin at bedtime. 06/29/17  [provider]  TRULICITY 1.5 DV/7.6HY SOPN Inject 1.5 mg into the skin every Wednesday. 06/28/17   [provider]  TURMERIC-GINGER PO Take 1 capsule by mouth daily in the afternoon.    [provider]    Family History    Family History  Problem Relation Age of Onset   Diabetes Mother    Hypertension Mother    Stomach cancer Mother    Heart attack Father        deceased from MI   Diabetes Sister    Diabetes Brother    Hypertension Brother    Cancer Paternal Grandmother        possible colon that went to liver   Cancer Paternal Grandfather        oral cancer   She indicated that her mother is deceased. She indicated that her father is deceased. She indicated that all of her three sisters are alive. She indicated that her brother is alive. She indicated that her maternal grandmother is deceased. She indicated that her maternal grandfather is deceased. She indicated that her paternal grandmother is deceased. She indicated that her paternal grandfather is  deceased.  Social History    Social History   Socioeconomic History   Marital status: Married    Spouse name: Not on file   Number of children: 5   Years of education: Not on file   Highest education level: Not on file  Occupational History   Not on file  Tobacco Use   Smoking status: Never   Smokeless tobacco: Never  Vaping Use   Vaping Use: Never used  Substance and Sexual Activity   Alcohol use: No   Drug use: No   Sexual activity: Yes    Partners: Male    Birth control/protection: Post-menopausal, Other-see comments    Comment: husband vasectomy  Other Topics Concern   Not on file  Social History Narrative   Not on file   Social Determinants of Health   Financial Resource Strain: Not on file  Food Insecurity: Not on file  Transportation Needs: Not on file  Physical Activity: Not on file  Stress: Not on file  Social Connections: Not on file  Intimate Partner Violence: Not on file     Review of Systems    General:  No chills, fever, night sweats or weight changes.  Cardiovascular:  No chest pain, dyspnea on exertion, edema, orthopnea, palpitations, paroxysmal nocturnal dyspnea. Dermatological: No rash, lesions/masses Respiratory: No cough, dyspnea Urologic: No hematuria, dysuria Abdominal:   No nausea, vomiting, diarrhea, bright red blood per rectum, melena, or hematemesis Neurologic:  No visual changes, wkns, changes in mental status. All other systems reviewed and are otherwise negative except as noted above.  Physical Exam    VS:  BP 112/66 (BP Location: Left Arm, Patient Position: Sitting, Cuff Size: Normal)   Pulse 68   Ht 5\' 3"  (1.6 m)   Wt 175 lb 9.6 oz (79.7 kg)   LMP 12/28/2004 (Approximate)   SpO2 98%   BMI 31.11 kg/m  , BMI Body mass index is 31.11 kg/m. GEN: Well nourished, well developed, in no acute distress. HEENT: normal. Neck: Supple, no JVD, carotid bruits, or masses. Cardiac: RRR, no murmurs, rubs, or gallops. No clubbing,  cyanosis, generalized bilateral lower extremity nonpitting edema.  Radials/DP/PT 2+ and equal bilaterally.  Respiratory:  Respirations regular and unlabored, clear to auscultation bilaterally. GI: Soft, nontender, nondistended, BS + x 4. MS: no deformity or atrophy. Skin: warm and  dry, no rash. Neuro:  Strength and sensation are intact. Psych: Normal affect.  Accessory Clinical Findings    Recent Labs: 05/07/2021: Magnesium 1.8 05/21/2021: ALT 25; Hemoglobin 10.7; Platelets 446 05/28/2021: BUN 17; Creatinine, Ser 1.35; Potassium 5.3; Sodium 141   Recent Lipid Panel No results found for: CHOL, TRIG, HDL, CHOLHDL, VLDL, LDLCALC, LDLDIRECT  ECG personally reviewed by me today-none today.  Echocardiogram 04/23/2021 IMPRESSIONS     1. Left ventricular ejection fraction, by estimation, is 30 to 35%. The  left ventricle has moderately decreased function. The left ventricle  demonstrates regional wall motion abnormalities (see scoring  diagram/findings for description). Left ventricular   diastolic parameters are consistent with Grade I diastolic dysfunction  (impaired relaxation).   2. Right ventricular systolic function is normal. The right ventricular  size is normal. There is normal pulmonary artery systolic pressure. The  estimated right ventricular systolic pressure is 93.9 mmHg.   3. The mitral valve is normal in structure. Mild mitral valve  regurgitation. No evidence of mitral stenosis.   4. The aortic valve is grossly normal. Aortic valve regurgitation is not  visualized. No aortic stenosis is present.   5. The inferior vena cava is normal in size with greater than 50%  respiratory variability, suggesting right atrial pressure of 3 mmHg.   Conclusion(s)/Recommendation(s): No left ventricular mural or apical  thrombus/thrombi.   Cardiac catheterization 05/01/2021 Prox RCA to Mid RCA lesion is 50% stenosed. RPAV lesion is 40% stenosed. Mid RCA lesion is 95% stenosed. Ramus  lesion is 99% stenosed. Mid Cx lesion is 60% stenosed. Prox LAD to Mid LAD lesion is 100% stenosed.   1. Chronic occlusion mid LAD with faint collateral filling from left to left collaterals 2. Severe stenosis in the small to moderate caliber ramus intermediate branch 3. Moderate stenosis in the mid Circumflex 4. Severe stenosis in the mid segment of the large, dominant RCA. The proximal and mid vessel is calcified.   Recommendations: She will need CT surgery consultation for CABG given multi-vessel CAD, CTO of the LAD and history of diabetes. She is asymptomatic at this time but she does have LV systolic dysfunction. I think revascularization is indicated. We have discussed discharge home and outpatient CT surgery consultation but she would like to remain in the hospital until she meets with the surgeon. Will arrange a telemetry bed.  Diagnostic Dominance: Right    Intervention   Assessment & Plan   1.  Coronary artery disease-denies chest pain today.  Underwent CABG x3 and continues to recover well.  Slowly increasing her physical activity.  All surgical incisions clean dry intact well approximated without drainage. Continuev aspirin, atorvastatin, metoprolol Heart healthy low-sodium diet-salty 6 given Increase physical activity as tolerated  Atrial fibrillation-denies recent episodes of increased heart rate or irregular heartbeats.  Noted to have postoperative atrial fibrillation.  CHA2DS2-VASc score 4 (age, gender, HF, CAD).  Will evaluate for A. fib and discontinue amiodarone if no recurrence on monitor. Order 14-day cardiac event monitor  Ischemic cardiomyopathy/HFrEF-no increased DOE or activity intolerance.  Echocardiogram 04/23/2021 showed LVEF 30-35%.  Unable to start ARB/Arni due to labile blood pressures. Continue metoprolol, furosemide Heart healthy low-sodium diet-salty 6 given Increase physical activity as tolerated Continue fluid restriction Elevate lower extremities  when not active Repeat BMP Recommend repeat echocardiogram 1 month after medical therapy has been optimized.  Essential hypertension-BP today 112/66.   Previously not a candidate for ARB Arni due to blood pressure and renal function. Continue metoprolol, furosemide Maintain  blood pressure log  Hyperlipidemia-LDL 79 on 02/26/2021. Continue atorvastatin Heart healthy low-sodium high-fiber diet Increase physical activity as tolerated Repeat fasting lipids and LFT  Disposition: Follow-up with Dr. Stanford Breed after cardiac event monitor.   Jossie Ng. Fontaine Kossman NP-C    07/03/2021, 10:10 AM [address] Office [phone] Fax [phone]  Notice: This dictation was prepared with Dragon dictation along with smaller phrase technology. Any transcriptional errors that result from this process are unintentional and may not be corrected upon review.  I spent 15 minutes examining this patient, reviewing medications, and using patient centered shared decision making involving her cardiac care.  Prior to her visit I spent greater than 20 minutes reviewing her past medical history,  medications, and prior cardiac tests.

## 2021-07-03 ENCOUNTER — Encounter: Payer: Self-pay | Admitting: General Practice

## 2021-07-03 ENCOUNTER — Other Ambulatory Visit: Payer: Self-pay

## 2021-07-03 ENCOUNTER — Ambulatory Visit: Payer: PPO | Admitting: General Practice

## 2021-07-03 ENCOUNTER — Ambulatory Visit (INDEPENDENT_AMBULATORY_CARE_PROVIDER_SITE_OTHER): Payer: PPO

## 2021-07-03 VITALS — BP 112/66 | HR 68 | Ht 63.0 in | Wt 175.6 lb

## 2021-07-03 DIAGNOSIS — E785 Hyperlipidemia, unspecified: Secondary | ICD-10-CM

## 2021-07-03 DIAGNOSIS — I5020 Unspecified systolic (congestive) heart failure: Secondary | ICD-10-CM

## 2021-07-03 DIAGNOSIS — I255 Ischemic cardiomyopathy: Secondary | ICD-10-CM | POA: Diagnosis not present

## 2021-07-03 DIAGNOSIS — I25118 Atherosclerotic heart disease of native coronary artery with other forms of angina pectoris: Secondary | ICD-10-CM

## 2021-07-03 DIAGNOSIS — Z79899 Other long term (current) drug therapy: Secondary | ICD-10-CM | POA: Diagnosis not present

## 2021-07-03 DIAGNOSIS — I4891 Unspecified atrial fibrillation: Secondary | ICD-10-CM

## 2021-07-03 DIAGNOSIS — I10 Essential (primary) hypertension: Secondary | ICD-10-CM

## 2021-07-03 MED ORDER — AMIODARONE HCL 200 MG PO TABS: 200.0000 mg | ORAL_TABLET | Freq: Two times a day (BID) | ORAL | 3 refills | Status: DC

## 2021-07-03 NOTE — Progress Notes (Unsigned)
Patient enrolled for [employer] to mail a 14 day ZIO XT monitor to address on file.  Patient to wear monitor starting 08/11/21, (mid August).

## 2021-07-03 NOTE — Patient Instructions (Signed)
Medication Instructions:  CONTINUE FUROSEMIDE 20MG   START AMIODARONE 200MG -TAKE FOR 1 MONTH THEN STOP  *If you need a refill on your cardiac medications before your next appointment, please call your pharmacy*  Lab Work:    FASTING LIPID PANEL, LFT AND BMET WHEN FASTING  Special Instructions PLEASE READ AND FOLLOW SALTY 6-ATTACHED-1,800mg  daily  Your physician has requested you wear a ZIO patch monitor for 14 days.  STARTING IN MID-AUGUST-SEE INSTRUCTIONS BELOW  Follow-Up: Your next appointment:  KEEP SCHEDULED APPT  In Person with Kirk Ruths, MD   At [facility], you and your health needs are our priority.  As part of our continuing mission to provide you with exceptional heart care, we have created designated Provider Care Teams.  These Care Teams include your primary Cardiologist (physician) and Advanced Practice Providers (APPs -  Physician Assistants and Nurse Practitioners) who all work together to provide you with the care you need, when you need it.            6 SALTY THINGS TO AVOID     1,800MG  DAILY     ZIO XT- Long Term Monitor Instructions  Your physician has requested you wear a ZIO patch monitor for 14 days.  This is a single patch monitor. [employer] supplies one patch monitor per enrollment. Additional stickers are not available. Please do not apply patch if you will be having a Nuclear Stress Test,  Echocardiogram, Cardiac CT, MRI, or Chest Xray during the period you would be wearing the  monitor. The patch cannot be worn during these tests. You cannot remove and re-apply the  ZIO XT patch monitor.  Your ZIO patch monitor will be mailed 3 day USPS to your address on file. It may take 3-5 days  to receive your monitor after you have been enrolled.  Once you have received your monitor, please review the enclosed instructions. Your monitor  has already been registered assigning a specific monitor serial # to you.  Billing and Patient Assistance Program  Information  We have supplied [employer] with any of your insurance information on file for billing purposes. [employer] offers a sliding scale Patient Assistance Program for patients that do not have  insurance, or whose insurance does not completely cover the cost of the ZIO monitor.  You must apply for the Patient Assistance Program to qualify for this discounted rate.  To apply, please call [employer] at [phone], select option 4, select option 2, ask to apply for  Patient Assistance Program. Theodore Demark will ask your household income, and how many people  are in your household. They will quote your out-of-pocket cost based on that information.  [employer] will also be able to set up a 50-month, interest-free payment plan if needed.  Applying the monitor   Shave hair from upper left chest.  Hold abrader disc by orange tab. Rub abrader in 40 strokes over the upper left chest as  indicated in your monitor instructions.  Clean area with 4 enclosed alcohol pads. Let dry.  Apply patch as indicated in monitor instructions. Patch will be placed under collarbone on left  side of chest with arrow pointing upward.  Rub patch adhesive wings for 2 minutes. Remove white label marked "1". Remove the white  label marked "2". Rub patch adhesive wings for 2 additional minutes.  While looking in a mirror, press and release button in center of patch. A small green light will  flash 3-4 times. This will be your only indicator that the monitor has  been turned on.  Do not shower for the first 24 hours. You may shower after the first 24 hours.  Press the button if you feel a symptom. You will hear a small click. Record Date, Time and  Symptom in the Patient Logbook.  When you are ready to remove the patch, follow instructions on the last 2 pages of Patient  Logbook. Stick patch monitor onto the last page of Patient Logbook.  Place Patient Logbook in the blue and white box. Use locking tab on box and tape box closed   securely. The blue and white box has prepaid postage on it. Please place it in the mailbox as  soon as possible. Your physician should have your test results approximately 7 days after the  monitor has been mailed back to [facility].  Call [place] at [phone] if you have questions regarding  your ZIO XT patch monitor. Call them immediately if you see an orange light blinking on your  monitor.  If your monitor falls off in less than 4 days, contact our Monitor department at [phone].  If your monitor becomes loose or falls off after 4 days call [employer] at [phone] for  suggestions on securing your monitor

## 2021-07-07 ENCOUNTER — Telehealth (HOSPITAL_COMMUNITY): Payer: Self-pay | Admitting: *Deleted

## 2021-07-07 NOTE — Telephone Encounter (Signed)
Reviewed pts health history. She is ready to start CR orientation on Thursday.  3:23 PM  07/07/2021

## 2021-07-09 DIAGNOSIS — I255 Ischemic cardiomyopathy: Secondary | ICD-10-CM | POA: Diagnosis not present

## 2021-07-09 DIAGNOSIS — Z79899 Other long term (current) drug therapy: Secondary | ICD-10-CM | POA: Diagnosis not present

## 2021-07-09 DIAGNOSIS — I25118 Atherosclerotic heart disease of native coronary artery with other forms of angina pectoris: Secondary | ICD-10-CM | POA: Diagnosis not present

## 2021-07-09 DIAGNOSIS — I4891 Unspecified atrial fibrillation: Secondary | ICD-10-CM | POA: Diagnosis not present

## 2021-07-09 DIAGNOSIS — I10 Essential (primary) hypertension: Secondary | ICD-10-CM | POA: Diagnosis not present

## 2021-07-09 DIAGNOSIS — I5020 Unspecified systolic (congestive) heart failure: Secondary | ICD-10-CM | POA: Diagnosis not present

## 2021-07-09 DIAGNOSIS — E785 Hyperlipidemia, unspecified: Secondary | ICD-10-CM | POA: Diagnosis not present

## 2021-07-09 LAB — HEPATIC FUNCTION PANEL
ALT: 31 IU/L (ref 0–32)
AST: 27 IU/L (ref 0–40)
Albumin: 4.1 g/dL (ref 3.8–4.8)
Alkaline Phosphatase: 119 IU/L (ref 44–121)
Bilirubin Total: 0.3 mg/dL (ref 0.0–1.2)
Bilirubin, Direct: 0.11 mg/dL (ref 0.00–0.40)
Total Protein: 6.8 g/dL (ref 6.0–8.5)

## 2021-07-09 LAB — LIPID PANEL
Chol/HDL Ratio: 2.1 ratio (ref 0.0–4.4)
Cholesterol, Total: 143 mg/dL (ref 100–199)
HDL: 68 mg/dL (ref >39)
LDL Chol Calc (NIH): 65 mg/dL (ref 0–99)
Triglycerides: 46 mg/dL (ref 0–149)
VLDL Cholesterol Cal: 10 mg/dL (ref 5–40)

## 2021-07-09 LAB — BASIC METABOLIC PANEL
BUN/Creatinine Ratio: 15 (ref 12–28)
BUN: 14 mg/dL (ref 8–27)
CO2: 24 mmol/L (ref 20–29)
Calcium: 9.9 mg/dL (ref 8.7–10.3)
Chloride: 99 mmol/L (ref 96–106)
Creatinine, Ser: 0.96 mg/dL (ref 0.57–1.00)
Glucose: 93 mg/dL (ref 65–99)
Potassium: 5.2 mmol/L (ref 3.5–5.2)
Sodium: 139 mmol/L (ref 134–144)
eGFR: 64 mL/min/{1.73_m2} (ref >59)

## 2021-07-10 ENCOUNTER — Other Ambulatory Visit: Payer: Self-pay

## 2021-07-10 ENCOUNTER — Encounter (HOSPITAL_COMMUNITY)
Admission: RE | Admit: 2021-07-10 | Discharge: 2021-07-10 | Disposition: A | Discharge status: Home / Self Care | Payer: PPO | Source: Ambulatory Visit | Attending: Cardiology | Admitting: Cardiology

## 2021-07-10 VITALS — BP 114/80 | HR 70 | Ht 64.25 in | Wt 176.6 lb

## 2021-07-10 DIAGNOSIS — E119 Type 2 diabetes mellitus without complications: Secondary | ICD-10-CM | POA: Insufficient documentation

## 2021-07-10 DIAGNOSIS — Z951 Presence of aortocoronary bypass graft: Secondary | ICD-10-CM | POA: Diagnosis not present

## 2021-07-10 LAB — GLUCOSE, CAPILLARY
Glucose-Capillary: 176 mg/dL — ABNORMAL HIGH (ref 70–99)
Glucose-Capillary: 129 mg/dL — ABNORMAL HIGH (ref 70–99)

## 2021-07-10 NOTE — Progress Notes (Signed)
Cardiac Rehab Medication Review   Does the patient  feel that his/her medications are working for him/her?  yes  Has the patient been experiencing any side effects to the medications prescribed?  NO  Does the patient measure his/her own blood pressure or blood glucose at home?  yes  Does the patient have any problems obtaining medications due to transportation or finances?   no  Understanding of regimen: excellent Understanding of indications: excellent Potential of compliance: excellent    Comments: Patient understands medication regimen and has no barriers, no side effect.      Crystal Fox 07/10/2021 11:06 AM

## 2021-07-10 NOTE — Progress Notes (Signed)
Cardiac Individual Treatment Plan  Patient Details  Name: Crystal Fox MRN: [id number] Date of Birth: Aug 06, 1951 Referring Provider:   Flowsheet Row CARDIAC REHAB PHASE II ORIENTATION from 07/10/2021 in [facility]  Referring Provider Fae Pippin, MD       Initial Encounter Date:  [facility] PHASE II ORIENTATION from 07/10/2021 in [place]  Date 07/10/21       Visit Diagnosis: S/P CABG x 3  Patient's Home Medications on Admission:  Current Outpatient Medications:    Ascorbic Acid (VITAMIN C GUMMIES PO), Take 250 mg by mouth daily., Disp: , Rfl:    aspirin 81 MG chewable tablet, Chew 81 mg by mouth daily., Disp: , Rfl:    atorvastatin (LIPITOR) 80 MG tablet, Take 1 tablet (80 mg total) by mouth daily. (Patient taking differently: Take 80 mg by mouth at bedtime.), Disp: 90 tablet, Rfl: 3   Calcium Carb-Cholecalciferol (CALCIUM 500 + D3 PO), Take 1 tablet by mouth daily., Disp: , Rfl:    Coenzyme Q10 (COQ-10) 100 MG CAPS, Take 100 mg by mouth every evening., Disp: , Rfl:    Estradiol (YUVAFEM) 10 MCG TABS vaginal tablet, PLACE 1 TABLET (10 MCG TOTAL) VAGINALLY 2 (TWO) TIMES A WEEK. (Patient taking differently: Place 10 mcg vaginally 2 (two) times a week. Tuesdays & Fridays), Disp: 24 tablet, Rfl: 3   furosemide (LASIX) 20 MG tablet, Take 1 tablet (20 mg total) by mouth daily., Disp: 30 tablet, Rfl: 2   INVOKANA 300 MG TABS, Take 300 mg by mouth daily before breakfast., Disp: , Rfl:    latanoprost (XALATAN) 0.005 % ophthalmic solution, Place 1 drop into both eyes at bedtime., Disp: , Rfl:    loratadine (CLARITIN) 10 MG tablet, Take 10 mg by mouth daily., Disp: , Rfl:    metFORMIN (GLUCOPHAGE-XR) 500 MG 24 hr tablet, Take 1,000 mg by mouth 2 (two) times daily with a meal., Disp: , Rfl:    metoprolol tartrate (LOPRESSOR) 25 MG tablet, Take 1 tablet (25 mg total) by mouth 2 (two) times daily., Disp: 60  tablet, Rfl: 3   Multiple Vitamin (MULTIVITAMIN WITH MINERALS) TABS tablet, Take 1 tablet by mouth daily at 2 PM. Multivitamin w/Omega-3, Disp: , Rfl:    TRESIBA FLEXTOUCH 200 UNIT/ML SOPN, Inject 22 Units into the skin at bedtime., Disp: , Rfl: 3   TRULICITY 1.5 BU/3.8GT SOPN, Inject 1.5 mg into the skin every Wednesday., Disp: , Rfl: 3   TURMERIC-GINGER PO, Take 1 capsule by mouth daily in the afternoon., Disp: , Rfl:    ACCU-CHEK AVIVA PLUS test strip, 1 each by Other route every morning., Disp: , Rfl:    Accu-Chek FastClix Lancets MISC, use to self monitor blood glucose four to six times per day, Disp: , Rfl:    ALPRAZolam (XANAX) 0.25 MG tablet, Take 0.25 mg by mouth every 6 (six) hours as needed for anxiety. (Patient not taking: Reported on 07/10/2021), Disp: , Rfl:    amiodarone (PACERONE) 200 MG tablet, Take 1 tablet (200 mg total) by mouth 2 (two) times daily. TAKE FOR 1 MONTH THEN STOP, Disp: 60 tablet, Rfl: 3   BD PEN NEEDLE NANO U/F 32G X 4 MM MISC, , Disp: , Rfl:    traMADol (ULTRAM) 50 MG tablet, Take 1-2 tablets (50-100 mg total) by mouth every 4 (four) hours as needed for moderate pain. 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   LIMA to LAD, SVG to ramus intermediate, SVG to PDA, EVH via right thigh    Tobacco Use: Social History   Tobacco Use  Smoking Status Never  Smokeless Tobacco Never    Labs: Recent Review Flowsheet Data     Labs for ITP Cardiac and Pulmonary Rehab Latest Ref Rng & Units 05/02/2021 05/02/2021 05/02/2021 05/03/2021  07/09/2021   Cholestrol 100 - 199 mg/dL - - - - 143   LDLCALC 0 - 99 mg/dL - - - - 65   HDL >39 mg/dL - - - - 68   Trlycerides 0 - 149 mg/dL - - - - 46   Hemoglobin A1c 4.8 - 5.6 % - - - - -   PHART 7.350 - 7.450 7.323(L) 7.363 7.300(L) - -   PCO2ART 32.0 - 48.0 mmHg 40.5 27.6(L) 31.0(L) - -   HCO3 20.0 - 28.0 mmol/L 21.3 15.8(L) 15.4(L) - -   TCO2 22 - 32 mmol/L 23 17(L) 16(L) - -   ACIDBASEDEF 0.0 - 2.0 mmol/L 5.0(H) 9.0(H) 10.0(H) - -   O2SAT % 98.0 98.0 96.0 97.9 -       Capillary Blood Glucose: Lab Results  Component Value Date   GLUCAP 129 (H) 07/10/2021   GLUCAP 176 (H) 07/10/2021   GLUCAP 161 (H) 05/08/2021   GLUCAP 193 (H) 05/07/2021   GLUCAP 206 (H) 05/07/2021     Exercise Target Goals: Exercise Program Goal: Individual exercise prescription set using results from initial 6 min walk test and THRR while considering  patient's activity barriers and safety.   Exercise Prescription Goal: Initial exercise prescription builds to 30-45 minutes a day of aerobic activity, 2-3 days per week.  Home exercise guidelines will be given to patient during program as part of exercise prescription that the participant will acknowledge.  Activity Barriers & Risk Stratification:  Activity Barriers & Cardiac Risk Stratification - 07/10/21 0940       Activity Barriers & Cardiac Risk Stratification   Activity Barriers Arthritis;Joint Problems;Deconditioning;Balance Concerns    Cardiac Risk Stratification High             6 Minute Walk:  6 Minute Walk     Row Name 07/10/21 0853         6 Minute Walk   Phase Initial     Distance 1037 feet     Walk Time 6 minutes     # of Rest Breaks 0     MPH 1.96     METS 2.02     RPE 11     Perceived Dyspnea  0     VO2 Peak 7.06     Symptoms Yes (comment)     Comments Chronic left knee pain  3.5/10     Resting HR 68 bpm     Resting BP 114/80     Resting Oxygen Saturation  98 %     Exercise Oxygen Saturation  during 6 min walk 97  %     Max Ex. HR 87 bpm  Max Ex. BP 124/78     2 Minute Post BP 110/64              Oxygen Initial Assessment:   Oxygen Re-Evaluation:   Oxygen Discharge (Final Oxygen Re-Evaluation):   Initial Exercise Prescription:  Initial Exercise Prescription - 07/10/21 0900       Date of Initial Exercise RX and Referring Provider   Date 07/10/21    Referring Provider Fae Pippin, MD    Expected Discharge Date 09/05/21      Recumbant Bike   Level 1.8    RPM 60    Minutes 15    METs 1.9      NuStep   Level 2    SPM 75    Minutes 15    METs 2      Prescription Details   Frequency (times per week) 3    Duration Progress to 30 minutes of continuous aerobic without signs/symptoms of physical distress      Intensity   THRR 40-80% of Max Heartrate 61-122    Ratings of Perceived Exertion 11-13    Perceived Dyspnea 0-4      Progression   Progression Continue progressive overload as per policy without signs/symptoms or physical distress.      Resistance Training   Training Prescription Yes    Weight 3 lbs    Reps 10-15             Perform Capillary Blood Glucose checks as needed.  Exercise Prescription Changes:   Exercise Comments:   Exercise Goals and Review:   Exercise Goals     Row Name 07/10/21 0940             Exercise Goals   Increase Physical Activity Yes       Intervention Provide advice, education, support and counseling about physical activity/exercise needs.;Develop an individualized exercise prescription for aerobic and resistive training based on initial evaluation findings, risk stratification, comorbidities and participant's personal goals.       Expected Outcomes Short Term: Attend rehab on a regular basis to increase amount of physical activity.;Long Term: Add in home exercise to make exercise part of routine and to increase amount of physical activity.;Long Term: Exercising regularly at least 3-5 days a week.       Increase  Strength and Stamina Yes       Intervention Provide advice, education, support and counseling about physical activity/exercise needs.;Develop an individualized exercise prescription for aerobic and resistive training based on initial evaluation findings, risk stratification, comorbidities and participant's personal goals.       Expected Outcomes Short Term: Increase workloads from initial exercise prescription for resistance, speed, and METs.;Short Term: Perform resistance training exercises routinely during rehab and add in resistance training at home;Long Term: Improve cardiorespiratory fitness, muscular endurance and strength as measured by increased METs and functional capacity (6MWT)       Able to understand and use rate of perceived exertion (RPE) scale Yes       Intervention Provide education and explanation on how to use RPE scale       Expected Outcomes Short Term: Able to use RPE daily in rehab to express subjective intensity level;Long Term:  Able to use RPE to guide intensity level when exercising independently       Knowledge and understanding of Target Heart Rate Range (THRR) Yes       Intervention Provide education and explanation of THRR including how the numbers were predicted and where they  are located for reference       Expected Outcomes Short Term: Able to state/look up THRR;Short Term: Able to use daily as guideline for intensity in rehab;Long Term: Able to use THRR to govern intensity when exercising independently       Understanding of Exercise Prescription Yes       Intervention Provide education, explanation, and written materials on patient's individual exercise prescription       Expected Outcomes Short Term: Able to explain program exercise prescription;Long Term: Able to explain home exercise prescription to exercise independently                Exercise Goals Re-Evaluation :   Discharge Exercise Prescription (Final Exercise Prescription Changes):   Nutrition:   Target Goals: Understanding of nutrition guidelines, daily intake of sodium 1500mg , cholesterol 200mg , calories 30% from fat and 7% or less from saturated fats, daily to have 5 or more servings of fruits and vegetables.  Biometrics:  Pre Biometrics - 07/10/21 0815       Pre Biometrics   Waist Circumference 44 inches    Hip Circumference 45.25 inches    Waist to Hip Ratio 0.97 %    Triceps Skinfold 28 mm    % Body Fat 43.5 %    Grip Strength 25 kg    Flexibility 14.25 in    Single Leg Stand 5.12 seconds   High fall risk             Nutrition Therapy Plan and Nutrition Goals:   Nutrition Assessments:  MEDIFICTS Score Key: ?70 Need to make dietary changes  40-70 Heart Healthy Diet ? 40 Therapeutic Level Cholesterol Diet    Picture Your Plate Scores: <83 Unhealthy dietary pattern with much room for improvement. 41-50 Dietary pattern unlikely to meet recommendations for good health and room for improvement. 51-60 More healthful dietary pattern, with some room for improvement.  >60 Healthy dietary pattern, although there may be some specific behaviors that could be improved.    Nutrition Goals Re-Evaluation:   Nutrition Goals Re-Evaluation:   Nutrition Goals Discharge (Final Nutrition Goals Re-Evaluation):   Psychosocial: Target Goals: Acknowledge presence or absence of significant depression and/or stress, maximize coping skills, provide positive support system. Participant is able to verbalize types and ability to use techniques and skills needed for reducing stress and depression.  Initial Review & Psychosocial Screening:  Initial Psych Review & Screening - 07/10/21 1246       Initial Review   Current issues with Current Sleep Concerns    Comments Due to the death of her husband and dealing with as she said "the practical side of life" she has a lot of things to take care of and get in order so there are nights she does not sleep as well due to thinking  about so much. She realizes good quality sleep is important and there are some night she does get the sleep she needs.      Family Dynamics   Good Support System? Yes   Crystal Fox has a very supportive family which include her children, sisters, a niece and nephew who live next door and cousins.   Concerns Recent loss of significant other    Comments Husband passed away when she returned from the hospital post by-pass. Parents and her brother have passed away. Support system are her Children, Sisters, Nieces, Nephews and Cousins      Barriers   Psychosocial barriers to participate in program There are no identifiable barriers  or psychosocial needs.   Clytie shared she does not foresee any barriers to participate and her family support her in Cardiac Rehab. She understands grief is normal and does not want it to get away from her. A friend provided her a name of a therapist who she intends to call.     Screening Interventions   Interventions Encouraged to exercise;Other (comment)    Comments Will encourage and support her follow-up with reaching out to the therapist she has intentions to call.    Expected Outcomes Short Term goal: Utilizing psychosocial counselor, staff and physician to assist with identification of specific Stressors or current issues interfering with healing process. Setting desired goal for each stressor or current issue identified.;Long Term goal: The participant improves quality of Life and PHQ9 Scores as seen by post scores and/or verbalization of changes             Quality of Life Scores:  Quality of Life - 07/10/21 0953       Quality of Life   Select Quality of Life      Quality of Life Scores   Health/Function Pre 18.71 %    Socioeconomic Pre 23.79 %    Psych/Spiritual Pre 16.92 %    Family Pre 24 %    GLOBAL Pre 20.19 %            Scores of 19 and below usually indicate a poorer quality of life in these areas.  A difference of  2-3 points is a clinically  meaningful difference.  A difference of 2-3 points in the total score of the Quality of Life Index has been associated with significant improvement in overall quality of life, self-image, physical symptoms, and general health in studies assessing change in quality of life.  PHQ-9: Recent Review Flowsheet Data     Depression screen [facility] 2/9 07/10/2021   Decreased Interest 1   Down, Depressed, Hopeless 1   PHQ - 2 Score 2   Altered sleeping 1   Tired, decreased energy 0   Change in appetite 0   Feeling bad or failure about yourself  0   Trouble concentrating 0   Moving slowly or fidgety/restless 0   Suicidal thoughts 0   PHQ-9 Score 3   Difficult doing work/chores Not difficult at all      Interpretation of Total Score  Total Score Depression Severity:  1-4 = Minimal depression, 5-9 = Mild depression, 10-14 = Moderate depression, 15-19 = Moderately severe depression, 20-27 = Severe depression   Psychosocial Evaluation and Intervention:   Psychosocial Re-Evaluation:   Psychosocial Discharge (Final Psychosocial Re-Evaluation):   Vocational Rehabilitation: Provide vocational rehab assistance to qualifying candidates.   Vocational Rehab Evaluation & Intervention:   Education: Education Goals: Education classes will be provided on a weekly basis, covering required topics. Participant will state understanding/return demonstration of topics presented.  Learning Barriers/Preferences:  Learning Barriers/Preferences - 07/10/21 0934       Learning Barriers/Preferences   Learning Barriers Hearing   Pt voices some hearing loss   Learning Preferences Video;Computer/Internet;Pictoral             Education Topics: Count Your Pulse:  -Group instruction provided by verbal instruction, demonstration, patient participation and written materials to support subject.  Instructors address importance of being able to find your pulse and how to count your pulse when at home without a  heart monitor.  Patients get hands on experience counting their pulse with staff help and individually.  Heart Attack, Angina, and Risk Factor Modification:  -Group instruction provided by verbal instruction, video, and written materials to support subject.  Instructors address signs and symptoms of angina and heart attacks.    Also discuss risk factors for heart disease and how to make changes to improve heart health risk factors.   Functional Fitness:  -Group instruction provided by verbal instruction, demonstration, patient participation, and written materials to support subject.  Instructors address safety measures for doing things around the house.  Discuss how to get up and down off the floor, how to pick things up properly, how to safely get out of a chair without assistance, and balance training.   Meditation and Mindfulness:  -Group instruction provided by verbal instruction, patient participation, and written materials to support subject.  Instructor addresses importance of mindfulness and meditation practice to help reduce stress and improve awareness.  Instructor also leads participants through a meditation exercise.    Stretching for Flexibility and Mobility:  -Group instruction provided by verbal instruction, patient participation, and written materials to support subject.  Instructors lead participants through series of stretches that are designed to increase flexibility thus improving mobility.  These stretches are additional exercise for major muscle groups that are typically performed during regular warm up and cool down.   Hands Only CPR:  -Group verbal, video, and participation provides a basic overview of AHA guidelines for community CPR. Role-play of emergencies allow participants the opportunity to practice calling for help and chest compression technique with discussion of AED use.   Hypertension: -Group verbal and written instruction that provides a basic overview of  hypertension including the most recent diagnostic guidelines, risk factor reduction with self-care instructions and medication management.    Nutrition I class: Heart Healthy Eating:  -Group instruction provided by PowerPoint slides, verbal discussion, and written materials to support subject matter. The instructor gives an explanation and review of the Therapeutic Lifestyle Changes diet recommendations, which includes a discussion on lipid goals, dietary fat, sodium, fiber, plant stanol/sterol esters, sugar, and the components of a well-balanced, healthy diet.   Nutrition II class: Lifestyle Skills:  -Group instruction provided by PowerPoint slides, verbal discussion, and written materials to support subject matter. The instructor gives an explanation and review of label reading, grocery shopping for heart health, heart healthy recipe modifications, and ways to make healthier choices when eating out.   Diabetes Question & Answer:  -Group instruction provided by PowerPoint slides, verbal discussion, and written materials to support subject matter. The instructor gives an explanation and review of diabetes co-morbidities, pre- and post-prandial blood glucose goals, pre-exercise blood glucose goals, signs, symptoms, and treatment of hypoglycemia and hyperglycemia, and foot care basics.   Diabetes Blitz:  -Group instruction provided by PowerPoint slides, verbal discussion, and written materials to support subject matter. The instructor gives an explanation and review of the physiology behind type 1 and type 2 diabetes, diabetes medications and rational behind using different medications, pre- and post-prandial blood glucose recommendations and Hemoglobin A1c goals, diabetes diet, and exercise including blood glucose guidelines for exercising safely.    Portion Distortion:  -Group instruction provided by PowerPoint slides, verbal discussion, written materials, and food models to support subject  matter. The instructor gives an explanation of serving size versus portion size, changes in portions sizes over the last 20 years, and what consists of a serving from each food group.   Stress Management:  -Group instruction provided by verbal instruction, video, and written materials to support subject matter.  Instructors review role of stress in heart disease and how to cope with stress positively.     Exercising on Your Own:  -Group instruction provided by verbal instruction, power point, and written materials to support subject.  Instructors discuss benefits of exercise, components of exercise, frequency and intensity of exercise, and end points for exercise.  Also discuss use of nitroglycerin and activating EMS.  Review options of places to exercise outside of rehab.  Review guidelines for sex with heart disease.   Cardiac Drugs I:  -Group instruction provided by verbal instruction and written materials to support subject.  Instructor reviews cardiac drug classes: antiplatelets, anticoagulants, beta blockers, and statins.  Instructor discusses reasons, side effects, and lifestyle considerations for each drug class.   Cardiac Drugs II:  -Group instruction provided by verbal instruction and written materials to support subject.  Instructor reviews cardiac drug classes: angiotensin converting enzyme inhibitors (ACE-I), angiotensin II receptor blockers (ARBs), nitrates, and calcium channel blockers.  Instructor discusses reasons, side effects, and lifestyle considerations for each drug class.   Anatomy and Physiology of the Circulatory System:  Group verbal and written instruction and models provide basic cardiac anatomy and physiology, with the coronary electrical and arterial systems. Review of: AMI, Angina, Valve disease, Heart Failure, Peripheral Artery Disease, Cardiac Arrhythmia, Pacemakers, and the ICD.   Other Education:  -Group or individual verbal, written, or video instructions  that support the educational goals of the cardiac rehab program.   Holiday Eating Survival Tips:  -Group instruction provided by PowerPoint slides, verbal discussion, and written materials to support subject matter. The instructor gives patients tips, tricks, and techniques to help them not only survive but enjoy the holidays despite the onslaught of food that accompanies the holidays.   Knowledge Questionnaire Score:  Knowledge Questionnaire Score - 07/10/21 0954       Knowledge Questionnaire Score   Pre Score 22/24             Core Components/Risk Factors/Patient Goals at Admission:  Personal Goals and Risk Factors at Admission - 07/10/21 0936       Core Components/Risk Factors/Patient Goals on Admission    Weight Management Yes;Obesity;Weight Loss    Intervention Weight Management: Develop a combined nutrition and exercise program designed to reach desired caloric intake, while maintaining appropriate intake of nutrient and fiber, sodium and fats, and appropriate energy expenditure required for the weight goal.;Weight Management: Provide education and appropriate resources to help participant work on and attain dietary goals.;Weight Management/Obesity: Establish reasonable short term and long term weight goals.;Obesity: Provide education and appropriate resources to help participant work on and attain dietary goals.    Admit Weight 176 lb 9.4 oz (80.1 kg)    Expected Outcomes Short Term: Continue to assess and modify interventions until short term weight is achieved;Long Term: Adherence to nutrition and physical activity/exercise program aimed toward attainment of established weight goal;Weight Maintenance: Understanding of the daily nutrition guidelines, which includes 25-35% calories from fat, 7% or less cal from saturated fats, less than 200mg  cholesterol, less than 1.5gm of sodium, & 5 or more servings of fruits and vegetables daily;Weight Loss: Understanding of general  recommendations for a balanced deficit meal plan, which promotes 1-2 lb weight loss per week and includes a negative energy balance of [phone] kcal/d;Understanding recommendations for meals to include 15-35% energy as protein, 25-35% energy from fat, 35-60% energy from carbohydrates, less than 200mg  of dietary cholesterol, 20-35 gm of total fiber daily;Understanding of distribution of calorie intake throughout the  day with the consumption of 4-5 meals/snacks    Diabetes Yes    Intervention Provide education about signs/symptoms and action to take for hypo/hyperglycemia.;Provide education about proper nutrition, including hydration, and aerobic/resistive exercise prescription along with prescribed medications to achieve blood glucose in normal ranges: Fasting glucose 65-99 mg/dL    Expected Outcomes Short Term: Participant verbalizes understanding of the signs/symptoms and immediate care of hyper/hypoglycemia, proper foot care and importance of medication, aerobic/resistive exercise and nutrition plan for blood glucose control.;Long Term: Attainment of HbA1C < 7%.    Hypertension Yes    Intervention Provide education on lifestyle modifcations including regular physical activity/exercise, weight management, moderate sodium restriction and increased consumption of fresh fruit, vegetables, and low fat dairy, alcohol moderation, and smoking cessation.;Monitor prescription use compliance.    Expected Outcomes Short Term: Continued assessment and intervention until BP is < 140/64mm HG in hypertensive participants. < 130/90mm HG in hypertensive participants with diabetes, heart failure or chronic kidney disease.;Long Term: Maintenance of blood pressure at goal levels.    Lipids Yes    Intervention Provide education and support for participant on nutrition & aerobic/resistive exercise along with prescribed medications to achieve LDL 70mg , HDL >40mg .    Expected Outcomes Short Term: Participant states understanding  of desired cholesterol values and is compliant with medications prescribed. Participant is following exercise prescription and nutrition guidelines.;Long Term: Cholesterol controlled with medications as prescribed, with individualized exercise RX and with personalized nutrition plan. Value goals: LDL < 70mg , HDL > 40 mg.    Stress Yes    Intervention Offer individual and/or small group education and counseling on adjustment to heart disease, stress management and health-related lifestyle change. Teach and support self-help strategies.;Refer participants experiencing significant psychosocial distress to appropriate mental health specialists for further evaluation and treatment. When possible, include family members and significant others in education/counseling sessions.    Expected Outcomes Short Term: Participant demonstrates changes in health-related behavior, relaxation and other stress management skills, ability to obtain effective social support, and compliance with psychotropic medications if prescribed.;Long Term: Emotional wellbeing is indicated by absence of clinically significant psychosocial distress or social isolation.             Core Components/Risk Factors/Patient Goals Review:    Core Components/Risk Factors/Patient Goals at Discharge (Final Review):    ITP Comments:   Comments: Patient attended orientation on 07/10/2021 to review rules and guidelines for program.  Completed 6 minute walk test, Intitial ITP, and exercise prescription.  VSS. Telemetry-Sinus Rhythm.  Asymptomatic. Safety measures and social distancing in place per CDC guidelines.

## 2021-07-14 ENCOUNTER — Encounter (HOSPITAL_COMMUNITY)
Admission: RE | Admit: 2021-07-14 | Discharge: 2021-07-14 | Disposition: A | Discharge status: Home / Self Care | Payer: PPO | Source: Ambulatory Visit | Attending: Cardiology | Admitting: Cardiology

## 2021-07-14 ENCOUNTER — Other Ambulatory Visit: Payer: Self-pay

## 2021-07-14 DIAGNOSIS — Z951 Presence of aortocoronary bypass graft: Secondary | ICD-10-CM

## 2021-07-14 LAB — GLUCOSE, CAPILLARY
Glucose-Capillary: 105 mg/dL — ABNORMAL HIGH (ref 70–99)
Glucose-Capillary: 93 mg/dL (ref 70–99)

## 2021-07-14 NOTE — Progress Notes (Signed)
Daily Session Note  Patient Details  Name: Crystal Fox MRN: [id number] Date of Birth: April 01, 1951 Referring Provider:   Flowsheet Row CARDIAC REHAB PHASE II ORIENTATION from 07/10/2021 in [facility]  Referring Provider Crystal Pippin, MD       Encounter Date: 07/14/2021  Check In:  Session Check In - 07/14/21 1145       Check-In   Supervising physician immediately available to respond to emergencies [facility] immediately available    Physician(s) Dr Cruzita Lederer    Location [facility]    Staff Present Maurice Small, RN, BSN;Jetta Walker BS, ACSM EP-C, Exercise Physiologist;Olinty Winfred, MS, ACSM CEP, Exercise Physiologist;David Oronogo, MS, ACSM-CEP, CCRP, Exercise Physiologist;Other    Virtual Visit No    Medication changes reported     No    Fall or balance concerns reported    No    Tobacco Cessation No Change    Warm-up and Cool-down Performed on first and last piece of equipment    Resistance Training Performed Yes    VAD Patient? No    PAD/SET Patient? No      Pain Assessment   Currently in Pain? No/denies    Pain Score 0-No pain    Multiple Pain Sites No             Capillary Blood Glucose: Results for orders placed or performed during the hospital encounter of 07/14/21 (from the past 24 hour(s))  Glucose, capillary     Status: None   Collection Time: 07/14/21 12:09 PM  Result Value Ref Range   Glucose-Capillary 93 70 - 99 mg/dL     Exercise Prescription Changes - 07/14/21 1121       Response to Exercise   Blood Pressure (Admit) 122/68    Blood Pressure (Exercise) 132/60    Blood Pressure (Exit) 124/72    Heart Rate (Admit) 77 bpm    Heart Rate (Exercise) 79 bpm    Heart Rate (Exit) 77 bpm    Rating of Perceived Exertion (Exercise) 11    Symptoms none    Comments Patient unable to use recumbent bike due to knee discomfort, switched to NuStep for duration.    Duration Progress to 30  minutes of  aerobic without signs/symptoms of physical distress    Intensity THRR unchanged      Progression   Progression Continue to progress workloads to maintain intensity without signs/symptoms of physical distress.    Average METs 1.8      Resistance Training   Training Prescription Yes    Weight 3 lbs    Reps 10-15    Time 10 Minutes      Interval Training   Interval Training No      NuStep   Level 2    SPM 75    Minutes 28    METs 1.8             Social History   Tobacco Use  Smoking Status Never  Smokeless Tobacco Never    Goals Met:  Exercise tolerated well No report of cardiac concerns or symptoms Strength training completed today  Goals Unmet:  Not Applicable  Comments: Pt started cardiac rehab today.  Pt tolerated light exercise without difficulty. VSS, telemetry-SR, asymptomatic.  Medication list reconciled. Pt denies barriers to medicaiton compliance.  PSYCHOSOCIAL ASSESSMENT:  PHQ-3.     Pt completed Quality of Life survey as a participant in Cardiac Rehab.  Scores 21.0 or below are considered  low.  Pt score very low in several areas Overall 20.19, Health and Function 18.71, socioeconomic 23.79, physiological and spiritual 16.92, family 24.0. Patient quality of life slightly altered by physical constraints which limits ability to perform as prior to recent cardiac illness and the recent and sudden loss of her husband two days after discharge from the hospital. Crystal Fox is dealing with two sudden and unexpected events that contribute to her depression.  Crystal Fox states that she is still grieving for her husband whom she found deceased in their home. Crystal Fox does feel supported at times from family and friends but often finds although well intended - not what she needs to hear at that time. She finds others often project their feelings or how they dealt with guilt.  Crystal Fox plans to call for counselin support.to schedule and appointment. This is on her list fo "things  to do".  Encourage Crystal Fox to make this a priority because this truly impacts the other items that are on her list. Offered emotional support and reassurance.  Will continue to monitor and intervene as necessary.    Pt enjoys baking, cooking and gardening.   Pt oriented to exercise equipment and routine.  Understanding verbalized. Maurice Small RN, BSN Cardiac and Pulmonary Rehab Nurse Navigator      Dr. Fransico Him is Medical Director for Cardiac Rehab at [facility].

## 2021-07-15 NOTE — Progress Notes (Signed)
Cardiac Individual Treatment Plan  Patient Details  Name: Crystal Fox MRN: [id number] Date of Birth: November 28, 1951 Referring Provider:   Flowsheet Row CARDIAC REHAB PHASE II ORIENTATION from 07/10/2021 in [place]  Referring Provider Fae Pippin, MD       Initial Encounter Date:  [place] PHASE II ORIENTATION from 07/10/2021 in [place]  Date 07/10/21       Visit Diagnosis: S/P CABG x 3  Patient's Home Medications on Admission:  Current Outpatient Medications:    ACCU-CHEK AVIVA PLUS test strip, 1 each by Other route every morning., Disp: , Rfl:    Accu-Chek FastClix Lancets MISC, use to self monitor blood glucose four to six times per day, Disp: , Rfl:    ALPRAZolam (XANAX) 0.25 MG tablet, Take 0.25 mg by mouth every 6 (six) hours as needed for anxiety. (Patient not taking: Reported on 07/10/2021), Disp: , Rfl:    amiodarone (PACERONE) 200 MG tablet, Take 1 tablet (200 mg total) by mouth 2 (two) times daily. TAKE FOR 1 MONTH THEN STOP, Disp: 60 tablet, Rfl: 3   Ascorbic Acid (VITAMIN C GUMMIES PO), Take 250 mg by mouth daily., Disp: , Rfl:    aspirin 81 MG chewable tablet, Chew 81 mg by mouth daily., Disp: , Rfl:    atorvastatin (LIPITOR) 80 MG tablet, Take 1 tablet (80 mg total) by mouth daily. (Patient taking differently: Take 80 mg by mouth at bedtime.), Disp: 90 tablet, Rfl: 3   BD PEN NEEDLE NANO U/F 32G X 4 MM MISC, , Disp: , Rfl:    Calcium Carb-Cholecalciferol (CALCIUM 500 + D3 PO), Take 1 tablet by mouth daily., Disp: , Rfl:    Coenzyme Q10 (COQ-10) 100 MG CAPS, Take 100 mg by mouth every evening., Disp: , Rfl:    Estradiol (YUVAFEM) 10 MCG TABS vaginal tablet, PLACE 1 TABLET (10 MCG TOTAL) VAGINALLY 2 (TWO) TIMES A WEEK. (Patient taking differently: Place 10 mcg vaginally 2 (two) times a week. Tuesdays & Fridays), Disp: 24 tablet, Rfl: 3   furosemide (LASIX) 20 MG tablet, Take 1  tablet (20 mg total) by mouth daily., Disp: 30 tablet, Rfl: 2   INVOKANA 300 MG TABS, Take 300 mg by mouth daily before breakfast., Disp: , Rfl:    latanoprost (XALATAN) 0.005 % ophthalmic solution, Place 1 drop into both eyes at bedtime., Disp: , Rfl:    loratadine (CLARITIN) 10 MG tablet, Take 10 mg by mouth daily., Disp: , Rfl:    metFORMIN (GLUCOPHAGE-XR) 500 MG 24 hr tablet, Take 1,000 mg by mouth 2 (two) times daily with a meal., Disp: , Rfl:    metoprolol tartrate (LOPRESSOR) 25 MG tablet, Take 1 tablet (25 mg total) by mouth 2 (two) times daily., Disp: 60 tablet, Rfl: 3   Multiple Vitamin (MULTIVITAMIN WITH MINERALS) TABS tablet, Take 1 tablet by mouth daily at 2 PM. Multivitamin w/Omega-3, Disp: , Rfl:    traMADol (ULTRAM) 50 MG tablet, Take 1-2 tablets (50-100 mg total) by mouth every 4 (four) hours as needed for moderate pain. (Patient not taking: Reported on 07/10/2021), Disp: 30 tablet, Rfl: 0   TRESIBA FLEXTOUCH 200 UNIT/ML SOPN, Inject 22 Units into the skin at bedtime., Disp: , Rfl: 3   TRULICITY 1.5 UY/4.0HK SOPN, Inject 1.5 mg into the skin every Wednesday., Disp: , Rfl: 3   TURMERIC-GINGER PO, Take 1 capsule by mouth daily in the afternoon., Disp: , Rfl:   Current  Facility-Administered Medications:    sodium chloride flush (NS) 0.9 % injection 3 mL, 3 mL, Intravenous, Q12H, Stanford Breed, Denice Bors, MD  Past Medical History: Past Medical History:  Diagnosis Date   Diabetes mellitus without complication ([place])    AODM   Fibroid    Glaucoma    Hyperlipidemia    Hypertension    Osteoarthritis    -both knees, right foot   Plantar fasciitis 2009   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   LIMA to LAD, SVG to ramus intermediate, SVG to PDA, EVH via right thigh    Tobacco Use: Social History   Tobacco Use  Smoking Status Never  Smokeless Tobacco Never    Labs: Recent Review Flowsheet Data     Labs for ITP Cardiac and Pulmonary Rehab Latest Ref Rng & Units 05/02/2021 05/02/2021 05/02/2021  05/03/2021 07/09/2021   Cholestrol 100 - 199 mg/dL - - - - 143   LDLCALC 0 - 99 mg/dL - - - - 65   HDL >39 mg/dL - - - - 68   Trlycerides 0 - 149 mg/dL - - - - 46   Hemoglobin A1c 4.8 - 5.6 % - - - - -   PHART 7.350 - 7.450 7.323(L) 7.363 7.300(L) - -   PCO2ART 32.0 - 48.0 mmHg 40.5 27.6(L) 31.0(L) - -   HCO3 20.0 - 28.0 mmol/L 21.3 15.8(L) 15.4(L) - -   TCO2 22 - 32 mmol/L 23 17(L) 16(L) - -   ACIDBASEDEF 0.0 - 2.0 mmol/L 5.0(H) 9.0(H) 10.0(H) - -   O2SAT % 98.0 98.0 96.0 97.9 -       Capillary Blood Glucose: Lab Results  Component Value Date   GLUCAP 102 (H) 07/16/2021   GLUCAP 93 07/14/2021   GLUCAP 105 (H) 07/14/2021   GLUCAP 129 (H) 07/10/2021   GLUCAP 176 (H) 07/10/2021     Exercise Target Goals: Exercise Program Goal: Individual exercise prescription set using results from initial 6 min walk test and THRR while considering  patient's activity barriers and safety.   Exercise Prescription Goal: Initial exercise prescription builds to 30-45 minutes a day of aerobic activity, 2-3 days per week.  Home exercise guidelines will be given to patient during program as part of exercise prescription that the participant will acknowledge.  Activity Barriers & Risk Stratification:  Activity Barriers & Cardiac Risk Stratification - 07/10/21 0940       Activity Barriers & Cardiac Risk Stratification   Activity Barriers Arthritis;Joint Problems;Deconditioning;Balance Concerns    Cardiac Risk Stratification High             6 Minute Walk:  6 Minute Walk     Row Name 07/10/21 0853         6 Minute Walk   Phase Initial     Distance 1037 feet     Walk Time 6 minutes     # of Rest Breaks 0     MPH 1.96     METS 2.02     RPE 11     Perceived Dyspnea  0     VO2 Peak 7.06     Symptoms Yes (comment)     Comments Chronic left knee pain  3.5/10     Resting HR 68 bpm     Resting BP 114/80     Resting Oxygen Saturation  98 %     Exercise Oxygen Saturation  during 6 min  walk 97 %     Max Ex. HR 87 bpm  Max Ex. BP 124/78     2 Minute Post BP 110/64              Oxygen Initial Assessment:   Oxygen Re-Evaluation:   Oxygen Discharge (Final Oxygen Re-Evaluation):   Initial Exercise Prescription:  Initial Exercise Prescription - 07/10/21 0900       Date of Initial Exercise RX and Referring Provider   Date 07/10/21    Referring Provider Fae Pippin, MD    Expected Discharge Date 09/05/21      Recumbant Bike   Level 1.8    RPM 60    Minutes 15    METs 1.9      NuStep   Level 2    SPM 75    Minutes 15    METs 2      Prescription Details   Frequency (times per week) 3    Duration Progress to 30 minutes of continuous aerobic without signs/symptoms of physical distress      Intensity   THRR 40-80% of Max Heartrate 61-122    Ratings of Perceived Exertion 11-13    Perceived Dyspnea 0-4      Progression   Progression Continue progressive overload as per policy without signs/symptoms or physical distress.      Resistance Training   Training Prescription Yes    Weight 3 lbs    Reps 10-15             Perform Capillary Blood Glucose checks as needed.  Exercise Prescription Changes:   Exercise Prescription Changes     Row Name 07/14/21 1121             Response to Exercise   Blood Pressure (Admit) 122/68       Blood Pressure (Exercise) 132/60       Blood Pressure (Exit) 124/72       Heart Rate (Admit) 77 bpm       Heart Rate (Exercise) 79 bpm       Heart Rate (Exit) 77 bpm       Rating of Perceived Exertion (Exercise) 11       Symptoms none       Comments Patient unable to use recumbent bike due to knee discomfort, switched to NuStep for duration.       Duration Progress to 30 minutes of  aerobic without signs/symptoms of physical distress       Intensity THRR unchanged               Progression     Progression Continue to progress workloads to maintain intensity without signs/symptoms of physical  distress.       Average METs 1.8               Resistance Training     Training Prescription Yes       Weight 3 lbs       Reps 10-15       Time 10 Minutes               Interval Training     Interval Training No               NuStep     Level 2       SPM 75       Minutes 28       METs 1.8               Exercise Comments:   Exercise Comments  [place] Name 07/14/21 1212           Exercise Comments Patient attempted the recumbent bike but unable to continue secondary to knee discomfort. Patient tolerated recumbent stepper well. Oriented to stretches and weights.                Exercise Goals and Review:   Exercise Goals     Row Name 07/10/21 0940             Exercise Goals   Increase Physical Activity Yes       Intervention Provide advice, education, support and counseling about physical activity/exercise needs.;Develop an individualized exercise prescription for aerobic and resistive training based on initial evaluation findings, risk stratification, comorbidities and participant's personal goals.       Expected Outcomes Short Term: Attend rehab on a regular basis to increase amount of physical activity.;Long Term: Add in home exercise to make exercise part of routine and to increase amount of physical activity.;Long Term: Exercising regularly at least 3-5 days a week.       Increase Strength and Stamina Yes       Intervention Provide advice, education, support and counseling about physical activity/exercise needs.;Develop an individualized exercise prescription for aerobic and resistive training based on initial evaluation findings, risk stratification, comorbidities and participant's personal goals.       Expected Outcomes Short Term: Increase workloads from initial exercise prescription for resistance, speed, and METs.;Short Term: Perform resistance training exercises routinely during rehab and add in resistance training at home;Long Term: Improve  cardiorespiratory fitness, muscular endurance and strength as measured by increased METs and functional capacity (6MWT)       Able to understand and use rate of perceived exertion (RPE) scale Yes       Intervention Provide education and explanation on how to use RPE scale       Expected Outcomes Short Term: Able to use RPE daily in rehab to express subjective intensity level;Long Term:  Able to use RPE to guide intensity level when exercising independently       Knowledge and understanding of Target Heart Rate Range (THRR) Yes       Intervention Provide education and explanation of THRR including how the numbers were predicted and where they are located for reference       Expected Outcomes Short Term: Able to state/look up THRR;Short Term: Able to use daily as guideline for intensity in rehab;Long Term: Able to use THRR to govern intensity when exercising independently       Understanding of Exercise Prescription Yes       Intervention Provide education, explanation, and written materials on patient's individual exercise prescription       Expected Outcomes Short Term: Able to explain program exercise prescription;Long Term: Able to explain home exercise prescription to exercise independently                Exercise Goals Re-Evaluation :  Exercise Goals Re-Evaluation     Row Name 07/14/21 1212             Exercise Goal Re-Evaluation   Exercise Goals Review Increase Physical Activity;Able to understand and use rate of perceived exertion (RPE) scale       Comments Patient able to understand and use RPE scale appropriately.       Expected Outcomes Progress workloads as tolerated to help increase strength and stamina.                Discharge Exercise Prescription (  Final Exercise Prescription Changes):  Exercise Prescription Changes - 07/14/21 1121       Response to Exercise   Blood Pressure (Admit) 122/68    Blood Pressure (Exercise) 132/60    Blood Pressure (Exit) 124/72     Heart Rate (Admit) 77 bpm    Heart Rate (Exercise) 79 bpm    Heart Rate (Exit) 77 bpm    Rating of Perceived Exertion (Exercise) 11    Symptoms none    Comments Patient unable to use recumbent bike due to knee discomfort, switched to NuStep for duration.    Duration Progress to 30 minutes of  aerobic without signs/symptoms of physical distress    Intensity THRR unchanged      Progression   Progression Continue to progress workloads to maintain intensity without signs/symptoms of physical distress.    Average METs 1.8      Resistance Training   Training Prescription Yes    Weight 3 lbs    Reps 10-15    Time 10 Minutes      Interval Training   Interval Training No      NuStep   Level 2    SPM 75    Minutes 28    METs 1.8             Nutrition:  Target Goals: Understanding of nutrition guidelines, daily intake of sodium 1500mg , cholesterol 200mg , calories 30% from fat and 7% or less from saturated fats, daily to have 5 or more servings of fruits and vegetables.  Biometrics:  Pre Biometrics - 07/10/21 0815       Pre Biometrics   Waist Circumference 44 inches    Hip Circumference 45.25 inches    Waist to Hip Ratio 0.97 %    Triceps Skinfold 28 mm    % Body Fat 43.5 %    Grip Strength 25 kg    Flexibility 14.25 in    Single Leg Stand 5.12 seconds   High fall risk             Nutrition Therapy Plan and Nutrition Goals:  Nutrition Therapy & Goals - 07/16/21 1329       Nutrition Therapy   Diet TLC    Drug/Food Interactions Statins/Certain Fruits      Personal Nutrition Goals   Nutrition Goal Pt to identify food quantities necessary to achieve weight loss of 6-24 lb at graduation from cardiac rehab.    Personal Goal #2 Pt to build a healthy plate including vegetables, fruits, whole grains, and low-fat dairy products in a heart healthy meal plan.    Personal Goal #3 Pt to follow 64 oz fluid restriction      Intervention Plan   Intervention Prescribe,  educate and counsel regarding individualized specific dietary modifications aiming towards targeted core components such as weight, hypertension, lipid management, diabetes, heart failure and other comorbidities.;Nutrition handout(s) given to patient.    Expected Outcomes Short Term Goal: A plan has been developed with personal nutrition goals set during dietitian appointment.;Long Term Goal: Adherence to prescribed nutrition plan.             Nutrition Assessments:  MEDIFICTS Score Key: ?70 Need to make dietary changes  40-70 Heart Healthy Diet ? 40 Therapeutic Level Cholesterol Diet    Picture Your Plate Scores: <32 Unhealthy dietary pattern with much room for improvement. 41-50 Dietary pattern unlikely to meet recommendations for good health and room for improvement. 51-60 More healthful dietary pattern, with some room for improvement.  >  60 Healthy dietary pattern, although there may be some specific behaviors that could be improved.    Nutrition Goals Re-Evaluation:  Nutrition Goals Re-Evaluation     [place] Name 07/16/21 1330             Goals   Current Weight 176 lb (79.8 kg)       Nutrition Goal Pt to identify food quantities necessary to achieve weight loss of 6-24 lb at graduation from cardiac rehab.               Personal Goal #2 Re-Evaluation     Personal Goal #2 Pt to build a healthy plate including vegetables, fruits, whole grains, and low-fat dairy products in a heart healthy meal plan.               Personal Goal #3 Re-Evaluation     Personal Goal #3 Pt to follow 64 oz fluid restriction               Nutrition Goals Re-Evaluation:  Nutrition Goals Re-Evaluation     [place] Name 07/16/21 1330             Goals   Current Weight 176 lb (79.8 kg)       Nutrition Goal Pt to identify food quantities necessary to achieve weight loss of 6-24 lb at graduation from cardiac rehab.               Personal Goal #2 Re-Evaluation     Personal Goal #2 Pt to  build a healthy plate including vegetables, fruits, whole grains, and low-fat dairy products in a heart healthy meal plan.               Personal Goal #3 Re-Evaluation     Personal Goal #3 Pt to follow 64 oz fluid restriction               Nutrition Goals Discharge (Final Nutrition Goals Re-Evaluation):  Nutrition Goals Re-Evaluation - 07/16/21 1330       Goals   Current Weight 176 lb (79.8 kg)    Nutrition Goal Pt to identify food quantities necessary to achieve weight loss of 6-24 lb at graduation from cardiac rehab.      Personal Goal #2 Re-Evaluation   Personal Goal #2 Pt to build a healthy plate including vegetables, fruits, whole grains, and low-fat dairy products in a heart healthy meal plan.      Personal Goal #3 Re-Evaluation   Personal Goal #3 Pt to follow 64 oz fluid restriction             Psychosocial: Target Goals: Acknowledge presence or absence of significant depression and/or stress, maximize coping skills, provide positive support system. Participant is able to verbalize types and ability to use techniques and skills needed for reducing stress and depression.  Initial Review & Psychosocial Screening:  Initial Psych Review & Screening - 07/10/21 1246       Initial Review   Current issues with Current Sleep Concerns    Comments Due to the death of her husband and dealing with as she said "the practical side of life" she has a lot of things to take care of and get in order so there are nights she does not sleep as well due to thinking about so much. She realizes good quality sleep is important and there are some night she does get the sleep she needs.      Family Dynamics   Good Support System? Yes  Indigo has a very supportive family which include her children, sisters, a niece and nephew who live next door and cousins.   Concerns Recent loss of significant other    Comments Husband passed away when she returned from the hospital post by-pass. Parents and  her brother have passed away. Support system are her Children, Sisters, Nieces, Nephews and Cousins      Barriers   Psychosocial barriers to participate in program There are no identifiable barriers or psychosocial needs.   Issabela shared she does not foresee any barriers to participate and her family support her in Cardiac Rehab. She understands grief is normal and does not want it to get away from her. A friend provided her a name of a therapist who she intends to call.     Screening Interventions   Interventions Encouraged to exercise;Other (comment)    Comments Will encourage and support her follow-up with reaching out to the therapist she has intentions to call.    Expected Outcomes Short Term goal: Utilizing psychosocial counselor, staff and physician to assist with identification of specific Stressors or current issues interfering with healing process. Setting desired goal for each stressor or current issue identified.;Long Term goal: The participant improves quality of Life and PHQ9 Scores as seen by post scores and/or verbalization of changes             Quality of Life Scores:  Quality of Life - 07/10/21 0953       Quality of Life   Select Quality of Life      Quality of Life Scores   Health/Function Pre 18.71 %    Socioeconomic Pre 23.79 %    Psych/Spiritual Pre 16.92 %    Family Pre 24 %    GLOBAL Pre 20.19 %            Scores of 19 and below usually indicate a poorer quality of life in these areas.  A difference of  2-3 points is a clinically meaningful difference.  A difference of 2-3 points in the total score of the Quality of Life Index has been associated with significant improvement in overall quality of life, self-image, physical symptoms, and general health in studies assessing change in quality of life.  PHQ-9: Recent Review Flowsheet Data     Depression screen [facility] 2/9 07/10/2021   Decreased Interest 1   Down, Depressed, Hopeless 1   PHQ - 2 Score 2    Altered sleeping 1   Tired, decreased energy 0   Change in appetite 0   Feeling bad or failure about yourself  0   Trouble concentrating 0   Moving slowly or fidgety/restless 0   Suicidal thoughts 0   PHQ-9 Score 3   Difficult doing work/chores Not difficult at all      Interpretation of Total Score  Total Score Depression Severity:  1-4 = Minimal depression, 5-9 = Mild depression, 10-14 = Moderate depression, 15-19 = Moderately severe depression, 20-27 = Severe depression   Psychosocial Evaluation and Intervention:   Psychosocial Re-Evaluation:  Psychosocial Re-Evaluation     [place] Name 07/16/21 1454             Psychosocial Re-Evaluation   Current issues with Current Sleep Concerns       Comments Will review quality of life questionnaire with the paitent later this week.       Expected Outcomes Rhylee will have decreased or better managed depression upon completion of phase 2 cardiac rehab  Interventions Stress management education;Encouraged to attend Cardiac Rehabilitation for the exercise;Therapist referral       Continue Psychosocial Services  Follow up required by staff                Psychosocial Discharge (Final Psychosocial Re-Evaluation):  Psychosocial Re-Evaluation - 07/16/21 1454       Psychosocial Re-Evaluation   Current issues with Current Sleep Concerns    Comments Will review quality of life questionnaire with the paitent later this week.    Expected Outcomes Nikeria will have decreased or better managed depression upon completion of phase 2 cardiac rehab    Interventions Stress management education;Encouraged to attend Cardiac Rehabilitation for the exercise;Therapist referral    Continue Psychosocial Services  Follow up required by staff             Vocational Rehabilitation: Provide vocational rehab assistance to qualifying candidates.   Vocational Rehab Evaluation & Intervention:   Education: Education Goals: Education classes will  be provided on a weekly basis, covering required topics. Participant will state understanding/return demonstration of topics presented.  Learning Barriers/Preferences:  Learning Barriers/Preferences - 07/10/21 0934       Learning Barriers/Preferences   Learning Barriers Hearing   Pt voices some hearing loss   Learning Preferences Video;Computer/Internet;Pictoral             Education Topics: Count Your Pulse:  -Group instruction provided by verbal instruction, demonstration, patient participation and written materials to support subject.  Instructors address importance of being able to find your pulse and how to count your pulse when at home without a heart monitor.  Patients get hands on experience counting their pulse with staff help and individually.   Heart Attack, Angina, and Risk Factor Modification:  -Group instruction provided by verbal instruction, video, and written materials to support subject.  Instructors address signs and symptoms of angina and heart attacks.    Also discuss risk factors for heart disease and how to make changes to improve heart health risk factors.   Functional Fitness:  -Group instruction provided by verbal instruction, demonstration, patient participation, and written materials to support subject.  Instructors address safety measures for doing things around the house.  Discuss how to get up and down off the floor, how to pick things up properly, how to safely get out of a chair without assistance, and balance training.   Meditation and Mindfulness:  -Group instruction provided by verbal instruction, patient participation, and written materials to support subject.  Instructor addresses importance of mindfulness and meditation practice to help reduce stress and improve awareness.  Instructor also leads participants through a meditation exercise.    Stretching for Flexibility and Mobility:  -Group instruction provided by verbal instruction, patient  participation, and written materials to support subject.  Instructors lead participants through series of stretches that are designed to increase flexibility thus improving mobility.  These stretches are additional exercise for major muscle groups that are typically performed during regular warm up and cool down.   Hands Only CPR:  -Group verbal, video, and participation provides a basic overview of AHA guidelines for community CPR. Role-play of emergencies allow participants the opportunity to practice calling for help and chest compression technique with discussion of AED use.   Hypertension: -Group verbal and written instruction that provides a basic overview of hypertension including the most recent diagnostic guidelines, risk factor reduction with self-care instructions and medication management.    Nutrition I class: Heart Healthy Eating:  -Group instruction provided by  PowerPoint slides, verbal discussion, and written materials to support subject matter. The instructor gives an explanation and review of the Therapeutic Lifestyle Changes diet recommendations, which includes a discussion on lipid goals, dietary fat, sodium, fiber, plant stanol/sterol esters, sugar, and the components of a well-balanced, healthy diet.   Nutrition II class: Lifestyle Skills:  -Group instruction provided by PowerPoint slides, verbal discussion, and written materials to support subject matter. The instructor gives an explanation and review of label reading, grocery shopping for heart health, heart healthy recipe modifications, and ways to make healthier choices when eating out.   Diabetes Question & Answer:  -Group instruction provided by PowerPoint slides, verbal discussion, and written materials to support subject matter. The instructor gives an explanation and review of diabetes co-morbidities, pre- and post-prandial blood glucose goals, pre-exercise blood glucose goals, signs, symptoms, and treatment of  hypoglycemia and hyperglycemia, and foot care basics.   Diabetes Blitz:  -Group instruction provided by PowerPoint slides, verbal discussion, and written materials to support subject matter. The instructor gives an explanation and review of the physiology behind type 1 and type 2 diabetes, diabetes medications and rational behind using different medications, pre- and post-prandial blood glucose recommendations and Hemoglobin A1c goals, diabetes diet, and exercise including blood glucose guidelines for exercising safely.    Portion Distortion:  -Group instruction provided by PowerPoint slides, verbal discussion, written materials, and food models to support subject matter. The instructor gives an explanation of serving size versus portion size, changes in portions sizes over the last 20 years, and what consists of a serving from each food group.   Stress Management:  -Group instruction provided by verbal instruction, video, and written materials to support subject matter.  Instructors review role of stress in heart disease and how to cope with stress positively.     Exercising on Your Own:  -Group instruction provided by verbal instruction, power point, and written materials to support subject.  Instructors discuss benefits of exercise, components of exercise, frequency and intensity of exercise, and end points for exercise.  Also discuss use of nitroglycerin and activating EMS.  Review options of places to exercise outside of rehab.  Review guidelines for sex with heart disease.   Cardiac Drugs I:  -Group instruction provided by verbal instruction and written materials to support subject.  Instructor reviews cardiac drug classes: antiplatelets, anticoagulants, beta blockers, and statins.  Instructor discusses reasons, side effects, and lifestyle considerations for each drug class.   Cardiac Drugs II:  -Group instruction provided by verbal instruction and written materials to support subject.   Instructor reviews cardiac drug classes: angiotensin converting enzyme inhibitors (ACE-I), angiotensin II receptor blockers (ARBs), nitrates, and calcium channel blockers.  Instructor discusses reasons, side effects, and lifestyle considerations for each drug class.   Anatomy and Physiology of the Circulatory System:  Group verbal and written instruction and models provide basic cardiac anatomy and physiology, with the coronary electrical and arterial systems. Review of: AMI, Angina, Valve disease, Heart Failure, Peripheral Artery Disease, Cardiac Arrhythmia, Pacemakers, and the ICD.   Other Education:  -Group or individual verbal, written, or video instructions that support the educational goals of the cardiac rehab program.   Holiday Eating Survival Tips:  -Group instruction provided by PowerPoint slides, verbal discussion, and written materials to support subject matter. The instructor gives patients tips, tricks, and techniques to help them not only survive but enjoy the holidays despite the onslaught of food that accompanies the holidays.   Knowledge Questionnaire Score:  Knowledge Questionnaire  Score - 07/10/21 0954       Knowledge Questionnaire Score   Pre Score 22/24             Core Components/Risk Factors/Patient Goals at Admission:  Personal Goals and Risk Factors at Admission - 07/10/21 0936       Core Components/Risk Factors/Patient Goals on Admission    Weight Management Yes;Obesity;Weight Loss    Intervention Weight Management: Develop a combined nutrition and exercise program designed to reach desired caloric intake, while maintaining appropriate intake of nutrient and fiber, sodium and fats, and appropriate energy expenditure required for the weight goal.;Weight Management: Provide education and appropriate resources to help participant work on and attain dietary goals.;Weight Management/Obesity: Establish reasonable short term and long term weight goals.;Obesity:  Provide education and appropriate resources to help participant work on and attain dietary goals.    Admit Weight 176 lb 9.4 oz (80.1 kg)    Expected Outcomes Short Term: Continue to assess and modify interventions until short term weight is achieved;Long Term: Adherence to nutrition and physical activity/exercise program aimed toward attainment of established weight goal;Weight Maintenance: Understanding of the daily nutrition guidelines, which includes 25-35% calories from fat, 7% or less cal from saturated fats, less than 200mg  cholesterol, less than 1.5gm of sodium, & 5 or more servings of fruits and vegetables daily;Weight Loss: Understanding of general recommendations for a balanced deficit meal plan, which promotes 1-2 lb weight loss per week and includes a negative energy balance of [phone] kcal/d;Understanding recommendations for meals to include 15-35% energy as protein, 25-35% energy from fat, 35-60% energy from carbohydrates, less than 200mg  of dietary cholesterol, 20-35 gm of total fiber daily;Understanding of distribution of calorie intake throughout the day with the consumption of 4-5 meals/snacks    Diabetes Yes    Intervention Provide education about signs/symptoms and action to take for hypo/hyperglycemia.;Provide education about proper nutrition, including hydration, and aerobic/resistive exercise prescription along with prescribed medications to achieve blood glucose in normal ranges: Fasting glucose 65-99 mg/dL    Expected Outcomes Short Term: Participant verbalizes understanding of the signs/symptoms and immediate care of hyper/hypoglycemia, proper foot care and importance of medication, aerobic/resistive exercise and nutrition plan for blood glucose control.;Long Term: Attainment of HbA1C < 7%.    Hypertension Yes    Intervention Provide education on lifestyle modifcations including regular physical activity/exercise, weight management, moderate sodium restriction and increased  consumption of fresh fruit, vegetables, and low fat dairy, alcohol moderation, and smoking cessation.;Monitor prescription use compliance.    Expected Outcomes Short Term: Continued assessment and intervention until BP is < 140/95mm HG in hypertensive participants. < 130/17mm HG in hypertensive participants with diabetes, heart failure or chronic kidney disease.;Long Term: Maintenance of blood pressure at goal levels.    Lipids Yes    Intervention Provide education and support for participant on nutrition & aerobic/resistive exercise along with prescribed medications to achieve LDL 70mg , HDL >40mg .    Expected Outcomes Short Term: Participant states understanding of desired cholesterol values and is compliant with medications prescribed. Participant is following exercise prescription and nutrition guidelines.;Long Term: Cholesterol controlled with medications as prescribed, with individualized exercise RX and with personalized nutrition plan. Value goals: LDL < 70mg , HDL > 40 mg.    Stress Yes    Intervention Offer individual and/or small group education and counseling on adjustment to heart disease, stress management and health-related lifestyle change. Teach and support self-help strategies.;Refer participants experiencing significant psychosocial distress to appropriate mental health specialists for further evaluation and treatment.  When possible, include family members and significant others in education/counseling sessions.    Expected Outcomes Short Term: Participant demonstrates changes in health-related behavior, relaxation and other stress management skills, ability to obtain effective social support, and compliance with psychotropic medications if prescribed.;Long Term: Emotional wellbeing is indicated by absence of clinically significant psychosocial distress or social isolation.             Core Components/Risk Factors/Patient Goals Review:   Goals and Risk Factor Review     Row Name  07/16/21 1547             Core Components/Risk Factors/Patient Goals Review   Personal Goals Review Weight Management/Obesity;Hypertension;Lipids;Diabetes;Stress       Review Darrel is off to a good start to exercise this week. Vital signs and CBG's have been stable.       Expected Outcomes Alysen will continue to participate in phase 2 cardiac rehab for exercise, nutrition and lifestyle modifications                Core Components/Risk Factors/Patient Goals at Discharge (Final Review):   Goals and Risk Factor Review - 07/16/21 1547       Core Components/Risk Factors/Patient Goals Review   Personal Goals Review Weight Management/Obesity;Hypertension;Lipids;Diabetes;Stress    Review Zayli is off to a good start to exercise this week. Vital signs and CBG's have been stable.    Expected Outcomes Daniell will continue to participate in phase 2 cardiac rehab for exercise, nutrition and lifestyle modifications             ITP Comments:  ITP Comments     Row Name 07/16/21 1445 07/16/21 1446         ITP Comments Dr Fransico Him MD, Medical Director 30 Day ITP Review. Cleaster started exercise on 07/14/21 and is off to a good start to exercise               Comments: See ITP Comments

## 2021-07-16 ENCOUNTER — Encounter (HOSPITAL_COMMUNITY)
Admission: RE | Admit: 2021-07-16 | Discharge: 2021-07-16 | Disposition: A | Discharge status: Home / Self Care | Payer: PPO | Source: Ambulatory Visit | Attending: Cardiology | Admitting: Cardiology

## 2021-07-16 ENCOUNTER — Other Ambulatory Visit: Payer: Self-pay

## 2021-07-16 DIAGNOSIS — Z951 Presence of aortocoronary bypass graft: Secondary | ICD-10-CM | POA: Diagnosis not present

## 2021-07-16 DIAGNOSIS — I25810 Atherosclerosis of coronary artery bypass graft(s) without angina pectoris: Secondary | ICD-10-CM | POA: Diagnosis not present

## 2021-07-16 DIAGNOSIS — Z794 Long term (current) use of insulin: Secondary | ICD-10-CM | POA: Diagnosis not present

## 2021-07-16 DIAGNOSIS — F418 Other specified anxiety disorders: Secondary | ICD-10-CM | POA: Diagnosis not present

## 2021-07-16 DIAGNOSIS — E785 Hyperlipidemia, unspecified: Secondary | ICD-10-CM | POA: Diagnosis not present

## 2021-07-16 DIAGNOSIS — I10 Essential (primary) hypertension: Secondary | ICD-10-CM | POA: Diagnosis not present

## 2021-07-16 DIAGNOSIS — E1169 Type 2 diabetes mellitus with other specified complication: Secondary | ICD-10-CM | POA: Diagnosis not present

## 2021-07-16 LAB — GLUCOSE, CAPILLARY: Glucose-Capillary: 102 mg/dL — ABNORMAL HIGH (ref 70–99)

## 2021-07-16 NOTE — Progress Notes (Signed)
Crystal Fox 70 y.o. 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   Current Outpatient Medications:    ACCU-CHEK AVIVA PLUS test strip, 1 each by Other route every morning., Disp: , Rfl:    Accu-Chek FastClix Lancets MISC, use to self monitor blood glucose four to six times per day, Disp: , Rfl:    ALPRAZolam (XANAX) 0.25 MG tablet, Take 0.25 mg by mouth every 6 (six) hours as needed for anxiety. (Patient not taking: Reported on 07/10/2021), Disp: , Rfl:    amiodarone (PACERONE) 200 MG tablet, Take 1 tablet (200 mg total) by mouth 2 (two) times daily. TAKE FOR 1 MONTH THEN STOP, Disp: 60 tablet, Rfl: 3   Ascorbic Acid (VITAMIN C GUMMIES PO), Take 250 mg by mouth daily., Disp: , Rfl:    aspirin 81 MG chewable tablet, Chew 81 mg by mouth daily., Disp: , Rfl:    atorvastatin (LIPITOR) 80 MG tablet, Take 1 tablet (80 mg total) by mouth daily. (Patient taking differently: Take 80 mg by mouth at bedtime.), Disp: 90 tablet, Rfl: 3   BD PEN NEEDLE NANO U/F 32G X 4 MM MISC, , Disp: , Rfl:    Calcium Carb-Cholecalciferol (CALCIUM 500 + D3 PO), Take 1 tablet by mouth daily., Disp: , Rfl:    Coenzyme Q10 (COQ-10) 100 MG CAPS, Take 100 mg by mouth every evening., Disp: , Rfl:    Estradiol (YUVAFEM) 10 MCG TABS vaginal tablet, PLACE 1 TABLET (10 MCG TOTAL) VAGINALLY 2 (TWO) TIMES A WEEK. (Patient taking differently: Place 10 mcg vaginally 2 (two) times a week. Tuesdays & Fridays), Disp: 24 tablet, Rfl: 3   furosemide (LASIX) 20 MG tablet, Take 1 tablet (20 mg total) by mouth daily., Disp: 30 tablet, Rfl: 2   INVOKANA 300 MG TABS, Take 300 mg by mouth daily  before breakfast., Disp: , Rfl:    latanoprost (XALATAN) 0.005 % ophthalmic solution, Place 1 drop into both eyes at bedtime., Disp: , Rfl:    loratadine (CLARITIN) 10 MG tablet, Take 10 mg by mouth daily., Disp: , Rfl:    metFORMIN (GLUCOPHAGE-XR) 500 MG 24 hr tablet, Take 1,000 mg by mouth 2 (two) times daily with a meal., Disp: , Rfl:    metoprolol tartrate (LOPRESSOR) 25 MG tablet, Take 1 tablet (25 mg total) by mouth 2 (two) times daily., Disp: 60 tablet, Rfl: 3   Multiple Vitamin (MULTIVITAMIN WITH MINERALS) TABS tablet, Take 1 tablet by mouth daily at 2 PM. Multivitamin w/Omega-3, Disp: , Rfl:    traMADol (ULTRAM) 50 MG tablet, Take 1-2 tablets (50-100 mg total) by mouth every 4 (four) hours as needed for moderate pain. (Patient not taking: Reported on 07/10/2021), Disp: 30 tablet, Rfl: 0   TRESIBA FLEXTOUCH 200 UNIT/ML SOPN, Inject 22 Units into the skin at bedtime., Disp: , Rfl: 3   TRULICITY 1.5 LF/8.1OF SOPN, Inject 1.5 mg into the skin every Wednesday., Disp: , Rfl: 3   TURMERIC-GINGER PO, Take 1 capsule by mouth daily in the afternoon., Disp: , Rfl:   Current Facility-Administered  Medications:    sodium chloride flush (NS) 0.9 % injection 3 mL, 3 mL, Intravenous, Q12H, Crenshaw, Denice Bors, MD   Ht Readings from Last 1 Encounters:  07/10/21 5' 4.25" (1.632 m)     Wt Readings from Last 3 Encounters:  07/10/21 176 lb 9.4 oz (80.1 kg)  07/03/21 175 lb 9.6 oz (79.7 kg)  06/02/21 175 lb (79.4 kg)     There is no height or weight on file to calculate BMI.   Social History   Tobacco Use  Smoking Status Never  Smokeless Tobacco Never     Lab Results  Component Value Date   CHOL 143 07/09/2021   Lab Results  Component Value Date   HDL 68 07/09/2021   Lab Results  Component Value Date   LDLCALC 65 07/09/2021   Lab Results  Component Value Date   TRIG 46 07/09/2021     Lab Results  Component Value Date   HGBA1C 6.7 (H) 05/02/2021     CBG (last 3)  Recent Labs     07/14/21 1106 07/14/21 1209 07/16/21 1155  GLUCAP 105* 93 102*     Nutrition Note  Spoke with pt. Nutrition Plan and Nutrition Survey goals reviewed with pt. Pt is following a Heart Healthy diet. Pt wants to lose wt. Pt has been trying to lose wt by reducing portions and reducing sugar intake. She stopped drinking mountain dew everyday. Wt loss tips reviewed (label reading, how to build a healthy plate, portion sizes, eating frequently across the day).  Pt has Type 2 Diabetes. Last A1c indicates blood glucose well-controlled.  Pt checks CBG's 1-2 times a day. Fasting CBG's reportedly 70-85 mg/dL. Her CBGs have been trending down since surgery. She reports having hypoglycemia often. She reports following hypoglycemia protocol.   Pt with dx of CHF. Per discussion, pt does use canned/convenience foods often. Pt does not add salt to food. Pt does not eat out frequently. She has reduced a lot of processed foods. She still eats salad dressings most days. She does read labels. She continues to follow fluid restriction. We discussed ways to make that easier  Pt expressed understanding of the information reviewed.   Nutrition Diagnosis  Obese   I = 30-34.9 related to excessive energy intake as evidenced by a BMI 30.08 kg/m2  Nutrition Intervention Pt's individual nutrition plan reviewed with pt. Benefits of adopting Heart Healthy diet discussed when Picture Your Plate reviewed.   Continue client-centered nutrition education by RD, as part of interdisciplinary care.  Goal(s) Pt to identify food quantities necessary to achieve weight loss of 6-24 lb at graduation from cardiac rehab.  Pt to build a healthy plate including vegetables, fruits, whole grains, and low-fat dairy products in a heart healthy meal plan. Pt to follow 64 oz fluid restriction   Plan:  Will provide client-centered nutrition education as part of interdisciplinary care Monitor and evaluate progress toward nutrition goal  with team.   Michaele Offer, MS, RDN, LDN

## 2021-07-18 ENCOUNTER — Encounter (HOSPITAL_COMMUNITY)
Admission: RE | Admit: 2021-07-18 | Discharge: 2021-07-18 | Disposition: A | Discharge status: Home / Self Care | Payer: PPO | Source: Ambulatory Visit | Attending: Cardiology | Admitting: Cardiology

## 2021-07-18 ENCOUNTER — Other Ambulatory Visit: Payer: Self-pay

## 2021-07-18 DIAGNOSIS — Z951 Presence of aortocoronary bypass graft: Secondary | ICD-10-CM | POA: Diagnosis not present

## 2021-07-18 LAB — GLUCOSE, CAPILLARY
Glucose-Capillary: 87 mg/dL (ref 70–99)
Glucose-Capillary: 94 mg/dL (ref 70–99)

## 2021-07-18 NOTE — Progress Notes (Signed)
Pt did not exercise due to blood sugar of reading of 94 and then 87 with recheck. Per guidelines BS needs to be above 110. RD met with patient to provide education for meal planning prior to exercise.

## 2021-07-18 NOTE — Progress Notes (Addendum)
Incomplete Session Note  Patient Details  Name: Crystal Fox MRN: [id number] Date of Birth: 29-Oct-1951 Referring Provider:   Flowsheet Row CARDIAC REHAB PHASE II ORIENTATION from 07/10/2021 in [facility]  Referring Provider Fae Pippin, MD       Domingo Pulse did not complete her rehab session.  Taressa with elevation of BP and weight.  Kaydyn stated that she had [place] food on Wednesday - taco salad and chips with salsa. Checked her pre exercise blood glucose - 94.  Pt was astonished and could not believe the reading.  Asked what her fasting blood glucose was - 83 upon awakening. Her last check was 128 before coming to exercise. Rusti had peanut butter and crackers to eat on the way.  Pt asked me to check again - 89.  Again Asra could not believe she was that low and asked for a third check - 87.  Based upon the medications that she takes for her diabetes which she had metformin this am, tressiba and trulicity at bedtime last night. Mikaelah mentioned that her doctor was decreasing her medication because of low reading in the am.  Per our protocol, Corley will not be able to exercise today.  Leverne spoke with Ailene Ravel regarding meal options for exercise days. Pt will continue to monitor her blood glucose throughout the day. Cherre Huger, BSN Cardiac and Training and development officer

## 2021-07-21 ENCOUNTER — Other Ambulatory Visit: Payer: Self-pay

## 2021-07-21 ENCOUNTER — Encounter (HOSPITAL_COMMUNITY)
Admission: RE | Admit: 2021-07-21 | Discharge: 2021-07-21 | Disposition: A | Discharge status: Home / Self Care | Payer: PPO | Source: Ambulatory Visit | Attending: Cardiology | Admitting: Cardiology

## 2021-07-21 DIAGNOSIS — Z951 Presence of aortocoronary bypass graft: Secondary | ICD-10-CM | POA: Diagnosis not present

## 2021-07-21 LAB — GLUCOSE, CAPILLARY
Glucose-Capillary: 87 mg/dL (ref 70–99)
Glucose-Capillary: 96 mg/dL (ref 70–99)

## 2021-07-21 NOTE — Progress Notes (Signed)
QUALITY OF LIFE SCORE REVIEW  Crystal Fox completed Quality of Life survey as a participant in Cardiac Rehab.  Scores 21.0 or below are considered low.  Pt score very low in several areas Overall 20.19, Health and Function 18.71, socioeconomic 23.79, physiological and spiritual 16.92, family 24.0. Patient quality of life slightly altered by physical constraints which limits ability to perform as prior to recent cardiac illness.Crystal Fox admits to being depressed since the recent passing of her husband.Crystal Fox does have the support of her children and siblings in the area. Crystal Fox is waiting for her health insurance provider to authorize her seeing a therapist for counseling.  Offered emotional support and reassurance.  Will continue to monitor and intervene as necessary.  Will forward Crystal Fox's quality of life questionnaire to Dr Jacquiline Doe office for review.Barnet Pall, RN,BSN 07/23/2021 9:25 AM

## 2021-07-23 ENCOUNTER — Other Ambulatory Visit: Payer: Self-pay

## 2021-07-23 ENCOUNTER — Encounter (HOSPITAL_COMMUNITY)
Admission: RE | Admit: 2021-07-23 | Discharge: 2021-07-23 | Disposition: A | Discharge status: Home / Self Care | Payer: PPO | Source: Ambulatory Visit | Attending: Cardiology | Admitting: Cardiology

## 2021-07-23 DIAGNOSIS — Z951 Presence of aortocoronary bypass graft: Secondary | ICD-10-CM

## 2021-07-23 NOTE — Progress Notes (Addendum)
Reviewed home exercise guidelines with patient including endpoints, temperature precautions, target heart rate and rate of perceived exertion. Patient is walking 10-15 minutes, 2 days/week as her mode of home exercise. Discussed increasing duration or frequency to achieve 150 minutes of aerobic exercise/week, and patient is agreeable to this. Walking is limited by low stamina and left knee pain. Patient is a member at [facility] [employer] and previously used the elliptical machine there as well as stretching and weight training. Patient will exercise on Tuesdays and Thursdays at the fitness center in addition to exercise at cardiac rehab to help build strength and stamina. Patient will hold weight lifing at fitness center until cleared by her physician. Patient voices understanding of instructions given.  Sol Passer, MS, ACSM CEP

## 2021-07-25 ENCOUNTER — Encounter (HOSPITAL_COMMUNITY)
Admission: RE | Admit: 2021-07-25 | Discharge: 2021-07-25 | Disposition: A | Discharge status: Home / Self Care | Payer: PPO | Source: Ambulatory Visit | Attending: Cardiology | Admitting: Cardiology

## 2021-07-25 ENCOUNTER — Other Ambulatory Visit: Payer: Self-pay

## 2021-07-25 DIAGNOSIS — Z951 Presence of aortocoronary bypass graft: Secondary | ICD-10-CM

## 2021-07-28 ENCOUNTER — Other Ambulatory Visit: Payer: Self-pay

## 2021-07-28 ENCOUNTER — Encounter (HOSPITAL_COMMUNITY)
Admission: RE | Admit: 2021-07-28 | Discharge: 2021-07-28 | Disposition: A | Discharge status: Home / Self Care | Payer: PPO | Source: Ambulatory Visit | Attending: Cardiology | Admitting: Cardiology

## 2021-07-28 DIAGNOSIS — Z48812 Encounter for surgical aftercare following surgery on the circulatory system: Secondary | ICD-10-CM | POA: Insufficient documentation

## 2021-07-28 DIAGNOSIS — Z951 Presence of aortocoronary bypass graft: Secondary | ICD-10-CM | POA: Insufficient documentation

## 2021-07-30 ENCOUNTER — Encounter (HOSPITAL_COMMUNITY)
Admission: RE | Admit: 2021-07-30 | Discharge: 2021-07-30 | Disposition: A | Discharge status: Home / Self Care | Payer: PPO | Source: Ambulatory Visit | Attending: Cardiology | Admitting: Cardiology

## 2021-07-30 ENCOUNTER — Other Ambulatory Visit: Payer: Self-pay

## 2021-07-30 DIAGNOSIS — Z48812 Encounter for surgical aftercare following surgery on the circulatory system: Secondary | ICD-10-CM | POA: Diagnosis not present

## 2021-07-30 DIAGNOSIS — Z951 Presence of aortocoronary bypass graft: Secondary | ICD-10-CM

## 2021-08-01 ENCOUNTER — Encounter (HOSPITAL_COMMUNITY)
Admission: RE | Admit: 2021-08-01 | Discharge: 2021-08-01 | Disposition: A | Discharge status: Home / Self Care | Payer: PPO | Source: Ambulatory Visit | Attending: Cardiology | Admitting: Cardiology

## 2021-08-01 ENCOUNTER — Other Ambulatory Visit: Payer: Self-pay

## 2021-08-01 DIAGNOSIS — Z48812 Encounter for surgical aftercare following surgery on the circulatory system: Secondary | ICD-10-CM | POA: Diagnosis not present

## 2021-08-01 DIAGNOSIS — Z951 Presence of aortocoronary bypass graft: Secondary | ICD-10-CM

## 2021-08-04 ENCOUNTER — Other Ambulatory Visit: Payer: Self-pay

## 2021-08-04 ENCOUNTER — Encounter (HOSPITAL_COMMUNITY)
Admission: RE | Admit: 2021-08-04 | Discharge: 2021-08-04 | Disposition: A | Discharge status: Home / Self Care | Payer: PPO | Source: Ambulatory Visit | Attending: Cardiology | Admitting: Cardiology

## 2021-08-04 DIAGNOSIS — Z951 Presence of aortocoronary bypass graft: Secondary | ICD-10-CM

## 2021-08-04 DIAGNOSIS — Z48812 Encounter for surgical aftercare following surgery on the circulatory system: Secondary | ICD-10-CM | POA: Diagnosis not present

## 2021-08-06 ENCOUNTER — Encounter (HOSPITAL_COMMUNITY)
Admission: RE | Admit: 2021-08-06 | Discharge: 2021-08-06 | Disposition: A | Discharge status: Home / Self Care | Payer: PPO | Source: Ambulatory Visit | Attending: Cardiology | Admitting: Cardiology

## 2021-08-06 ENCOUNTER — Other Ambulatory Visit: Payer: Self-pay

## 2021-08-06 DIAGNOSIS — Z794 Long term (current) use of insulin: Secondary | ICD-10-CM | POA: Diagnosis not present

## 2021-08-06 DIAGNOSIS — E113299 Type 2 diabetes mellitus with mild nonproliferative diabetic retinopathy without macular edema, unspecified eye: Secondary | ICD-10-CM | POA: Diagnosis not present

## 2021-08-06 DIAGNOSIS — I10 Essential (primary) hypertension: Secondary | ICD-10-CM | POA: Diagnosis not present

## 2021-08-06 DIAGNOSIS — E1169 Type 2 diabetes mellitus with other specified complication: Secondary | ICD-10-CM | POA: Diagnosis not present

## 2021-08-06 DIAGNOSIS — G629 Polyneuropathy, unspecified: Secondary | ICD-10-CM | POA: Diagnosis not present

## 2021-08-06 DIAGNOSIS — Z951 Presence of aortocoronary bypass graft: Secondary | ICD-10-CM

## 2021-08-06 DIAGNOSIS — I25810 Atherosclerosis of coronary artery bypass graft(s) without angina pectoris: Secondary | ICD-10-CM | POA: Diagnosis not present

## 2021-08-06 DIAGNOSIS — E785 Hyperlipidemia, unspecified: Secondary | ICD-10-CM | POA: Diagnosis not present

## 2021-08-06 DIAGNOSIS — Z48812 Encounter for surgical aftercare following surgery on the circulatory system: Secondary | ICD-10-CM | POA: Diagnosis not present

## 2021-08-08 ENCOUNTER — Encounter (HOSPITAL_COMMUNITY)
Admission: RE | Admit: 2021-08-08 | Discharge: 2021-08-08 | Disposition: A | Discharge status: Home / Self Care | Payer: PPO | Source: Ambulatory Visit | Attending: Cardiology | Admitting: Cardiology

## 2021-08-08 ENCOUNTER — Other Ambulatory Visit: Payer: Self-pay

## 2021-08-08 DIAGNOSIS — Z951 Presence of aortocoronary bypass graft: Secondary | ICD-10-CM

## 2021-08-08 DIAGNOSIS — Z48812 Encounter for surgical aftercare following surgery on the circulatory system: Secondary | ICD-10-CM | POA: Diagnosis not present

## 2021-08-09 DIAGNOSIS — I4891 Unspecified atrial fibrillation: Secondary | ICD-10-CM | POA: Diagnosis not present

## 2021-08-11 ENCOUNTER — Other Ambulatory Visit: Payer: Self-pay

## 2021-08-11 ENCOUNTER — Encounter (HOSPITAL_COMMUNITY)
Admission: RE | Admit: 2021-08-11 | Discharge: 2021-08-11 | Disposition: A | Discharge status: Home / Self Care | Payer: PPO | Source: Ambulatory Visit | Attending: Cardiology | Admitting: Cardiology

## 2021-08-11 DIAGNOSIS — Z951 Presence of aortocoronary bypass graft: Secondary | ICD-10-CM

## 2021-08-11 DIAGNOSIS — Z48812 Encounter for surgical aftercare following surgery on the circulatory system: Secondary | ICD-10-CM | POA: Diagnosis not present

## 2021-08-11 NOTE — Progress Notes (Signed)
Cardiac Individual Treatment Plan  Patient Details  Name: Crystal Fox MRN: [id number] Date of Birth: November 28, 1951 Referring Provider:   Flowsheet Row CARDIAC REHAB PHASE II ORIENTATION from 07/10/2021 in [place]  Referring Provider Fae Pippin, MD       Initial Encounter Date:  [place] PHASE II ORIENTATION from 07/10/2021 in [place]  Date 07/10/21       Visit Diagnosis: S/P CABG x 3  Patient's Home Medications on Admission:  Current Outpatient Medications:    ACCU-CHEK AVIVA PLUS test strip, 1 each by Other route every morning., Disp: , Rfl:    Accu-Chek FastClix Lancets MISC, use to self monitor blood glucose four to six times per day, Disp: , Rfl:    ALPRAZolam (XANAX) 0.25 MG tablet, Take 0.25 mg by mouth every 6 (six) hours as needed for anxiety. (Patient not taking: Reported on 07/10/2021), Disp: , Rfl:    amiodarone (PACERONE) 200 MG tablet, Take 1 tablet (200 mg total) by mouth 2 (two) times daily. TAKE FOR 1 MONTH THEN STOP, Disp: 60 tablet, Rfl: 3   Ascorbic Acid (VITAMIN C GUMMIES PO), Take 250 mg by mouth daily., Disp: , Rfl:    aspirin 81 MG chewable tablet, Chew 81 mg by mouth daily., Disp: , Rfl:    atorvastatin (LIPITOR) 80 MG tablet, Take 1 tablet (80 mg total) by mouth daily. (Patient taking differently: Take 80 mg by mouth at bedtime.), Disp: 90 tablet, Rfl: 3   BD PEN NEEDLE NANO U/F 32G X 4 MM MISC, , Disp: , Rfl:    Calcium Carb-Cholecalciferol (CALCIUM 500 + D3 PO), Take 1 tablet by mouth daily., Disp: , Rfl:    Coenzyme Q10 (COQ-10) 100 MG CAPS, Take 100 mg by mouth every evening., Disp: , Rfl:    Estradiol (YUVAFEM) 10 MCG TABS vaginal tablet, PLACE 1 TABLET (10 MCG TOTAL) VAGINALLY 2 (TWO) TIMES A WEEK. (Patient taking differently: Place 10 mcg vaginally 2 (two) times a week. Tuesdays & Fridays), Disp: 24 tablet, Rfl: 3   furosemide (LASIX) 20 MG tablet, Take 1  tablet (20 mg total) by mouth daily., Disp: 30 tablet, Rfl: 2   INVOKANA 300 MG TABS, Take 300 mg by mouth daily before breakfast., Disp: , Rfl:    latanoprost (XALATAN) 0.005 % ophthalmic solution, Place 1 drop into both eyes at bedtime., Disp: , Rfl:    loratadine (CLARITIN) 10 MG tablet, Take 10 mg by mouth daily., Disp: , Rfl:    metFORMIN (GLUCOPHAGE-XR) 500 MG 24 hr tablet, Take 1,000 mg by mouth 2 (two) times daily with a meal., Disp: , Rfl:    metoprolol tartrate (LOPRESSOR) 25 MG tablet, Take 1 tablet (25 mg total) by mouth 2 (two) times daily., Disp: 60 tablet, Rfl: 3   Multiple Vitamin (MULTIVITAMIN WITH MINERALS) TABS tablet, Take 1 tablet by mouth daily at 2 PM. Multivitamin w/Omega-3, Disp: , Rfl:    traMADol (ULTRAM) 50 MG tablet, Take 1-2 tablets (50-100 mg total) by mouth every 4 (four) hours as needed for moderate pain. (Patient not taking: Reported on 07/10/2021), Disp: 30 tablet, Rfl: 0   TRESIBA FLEXTOUCH 200 UNIT/ML SOPN, Inject 22 Units into the skin at bedtime., Disp: , Rfl: 3   TRULICITY 1.5 UY/4.0HK SOPN, Inject 1.5 mg into the skin every Wednesday., Disp: , Rfl: 3   TURMERIC-GINGER PO, Take 1 capsule by mouth daily in the afternoon., Disp: , Rfl:   Current  Facility-Administered Medications:    sodium chloride flush (NS) 0.9 % injection 3 mL, 3 mL, Intravenous, Q12H, Stanford Breed, Denice Bors, MD  Past Medical History: Past Medical History:  Diagnosis Date   Diabetes mellitus without complication ([place])    AODM   Fibroid    Glaucoma    Hyperlipidemia    Hypertension    Osteoarthritis    -both knees, right foot   Plantar fasciitis 2009   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   LIMA to LAD, SVG to ramus intermediate, SVG to PDA, EVH via right thigh    Tobacco Use: Social History   Tobacco Use  Smoking Status Never  Smokeless Tobacco Never    Labs: Recent Review Flowsheet Data     Labs for ITP Cardiac and Pulmonary Rehab Latest Ref Rng & Units 05/02/2021 05/02/2021 05/02/2021  05/03/2021 07/09/2021   Cholestrol 100 - 199 mg/dL - - - - 143   LDLCALC 0 - 99 mg/dL - - - - 65   HDL >39 mg/dL - - - - 68   Trlycerides 0 - 149 mg/dL - - - - 46   Hemoglobin A1c 4.8 - 5.6 % - - - - -   PHART 7.350 - 7.450 7.323(L) 7.363 7.300(L) - -   PCO2ART 32.0 - 48.0 mmHg 40.5 27.6(L) 31.0(L) - -   HCO3 20.0 - 28.0 mmol/L 21.3 15.8(L) 15.4(L) - -   TCO2 22 - 32 mmol/L 23 17(L) 16(L) - -   ACIDBASEDEF 0.0 - 2.0 mmol/L 5.0(H) 9.0(H) 10.0(H) - -   O2SAT % 98.0 98.0 96.0 97.9 -       Capillary Blood Glucose: Lab Results  Component Value Date   GLUCAP 96 07/21/2021   GLUCAP 87 07/18/2021   GLUCAP 87 07/18/2021   GLUCAP 94 07/18/2021   GLUCAP 102 (H) 07/16/2021     Exercise Target Goals: Exercise Program Goal: Individual exercise prescription set using results from initial 6 min walk test and THRR while considering  patient's activity barriers and safety.   Exercise Prescription Goal: Initial exercise prescription builds to 30-45 minutes a day of aerobic activity, 2-3 days per week.  Home exercise guidelines will be given to patient during program as part of exercise prescription that the participant will acknowledge.  Activity Barriers & Risk Stratification:  Activity Barriers & Cardiac Risk Stratification - 07/10/21 0940       Activity Barriers & Cardiac Risk Stratification   Activity Barriers Arthritis;Joint Problems;Deconditioning;Balance Concerns    Cardiac Risk Stratification High             6 Minute Walk:  6 Minute Walk     Row Name 07/10/21 0853         6 Minute Walk   Phase Initial     Distance 1037 feet     Walk Time 6 minutes     # of Rest Breaks 0     MPH 1.96     METS 2.02     RPE 11     Perceived Dyspnea  0     VO2 Peak 7.06     Symptoms Yes (comment)     Comments Chronic left knee pain  3.5/10     Resting HR 68 bpm     Resting BP 114/80     Resting Oxygen Saturation  98 %     Exercise Oxygen Saturation  during 6 min walk 97 %     Max  Ex. HR 87 bpm  Max Ex. BP 124/78     2 Minute Post BP 110/64              Oxygen Initial Assessment:   Oxygen Re-Evaluation:   Oxygen Discharge (Final Oxygen Re-Evaluation):   Initial Exercise Prescription:  Initial Exercise Prescription - 07/10/21 0900       Date of Initial Exercise RX and Referring Provider   Date 07/10/21    Referring Provider Fae Pippin, MD    Expected Discharge Date 09/05/21      Recumbant Bike   Level 1.8    RPM 60    Minutes 15    METs 1.9      NuStep   Level 2    SPM 75    Minutes 15    METs 2      Prescription Details   Frequency (times per week) 3    Duration Progress to 30 minutes of continuous aerobic without signs/symptoms of physical distress      Intensity   THRR 40-80% of Max Heartrate 61-122    Ratings of Perceived Exertion 11-13    Perceived Dyspnea 0-4      Progression   Progression Continue progressive overload as per policy without signs/symptoms or physical distress.      Resistance Training   Training Prescription Yes    Weight 3 lbs    Reps 10-15             Perform Capillary Blood Glucose checks as needed.  Exercise Prescription Changes:   Exercise Prescription Changes     Row Name 07/14/21 1121 07/28/21 1104 08/11/21 1101         Response to Exercise   Blood Pressure (Admit) 122/68 128/70 124/68     Blood Pressure (Exercise) 132/60 124/72 132/74     Blood Pressure (Exit) 124/72 132/64 122/68     Heart Rate (Admit) 77 bpm 76 bpm 64 bpm     Heart Rate (Exercise) 79 bpm 98 bpm 110 bpm     Heart Rate (Exit) 77 bpm 76 bpm 70 bpm     Rating of Perceived Exertion (Exercise) _0 Symptoms none none none     Comments Patient unable to use recumbent bike due to knee discomfort, switched to NuStep for duration. -- Increaed workload on NuStep.     Duration Progress to 30 minutes of  aerobic without signs/symptoms of physical distress Progress to 30 minutes of  aerobic without  signs/symptoms of physical distress Progress to 30 minutes of  aerobic without signs/symptoms of physical distress     Intensity THRR unchanged THRR unchanged THRR unchanged           Progression   Progression Continue to progress workloads to maintain intensity without signs/symptoms of physical distress. Continue to progress workloads to maintain intensity without signs/symptoms of physical distress. Continue to progress workloads to maintain intensity without signs/symptoms of physical distress.     Average METs 1._1 Resistance Training   Training Prescription Yes Yes Yes     Weight 3 lbs 3 lbs 3 lbs     Reps 10-15 10-15 10-15     Time 10 Minutes 10 Minutes 10 Minutes           Interval Training   Interval Training No No No           NuStep   Level 2 2 4  SPM 75 85 75     Minutes _0 METs 1._1 Home Exercise Plan   Plans to continue exercise at -- [employer] (comment) [employer] (comment)     Frequency -- Add 2 additional days to program exercise sessions. Add 2 additional days to program exercise sessions.     Initial Home Exercises Provided -- 07/23/21 07/23/21              Exercise Comments:   Exercise Comments     Row Name 07/14/21 1212 07/23/21 1126 07/28/21 1105 08/11/21 1136     Exercise Comments Patient attempted the recumbent bike but unable to continue secondary to knee discomfort. Patient tolerated recumbent stepper well. Oriented to stretches and weights. Reviewed home exercise guidelines, METs, and goals with patient. Reviewed METs and goals with patient. Reviewed METs with patient. Increased workload on NuStep.             Exercise Goals and Review:   Exercise Goals     Row Name 07/10/21 0940             Exercise Goals   Increase Physical Activity Yes       Intervention Provide advice, education, support and counseling about physical activity/exercise needs.;Develop an individualized  exercise prescription for aerobic and resistive training based on initial evaluation findings, risk stratification, comorbidities and participant's personal goals.       Expected Outcomes Short Term: Attend rehab on a regular basis to increase amount of physical activity.;Long Term: Add in home exercise to make exercise part of routine and to increase amount of physical activity.;Long Term: Exercising regularly at least 3-5 days a week.       Increase Strength and Stamina Yes       Intervention Provide advice, education, support and counseling about physical activity/exercise needs.;Develop an individualized exercise prescription for aerobic and resistive training based on initial evaluation findings, risk stratification, comorbidities and participant's personal goals.       Expected Outcomes Short Term: Increase workloads from initial exercise prescription for resistance, speed, and METs.;Short Term: Perform resistance training exercises routinely during rehab and add in resistance training at home;Long Term: Improve cardiorespiratory fitness, muscular endurance and strength as measured by increased METs and functional capacity (6MWT)       Able to understand and use rate of perceived exertion (RPE) scale Yes       Intervention Provide education and explanation on how to use RPE scale       Expected Outcomes Short Term: Able to use RPE daily in rehab to express subjective intensity level;Long Term:  Able to use RPE to guide intensity level when exercising independently       Knowledge and understanding of Target Heart Rate Range (THRR) Yes       Intervention Provide education and explanation of THRR including how the numbers were predicted and where they are located for reference       Expected Outcomes Short Term: Able to state/look up THRR;Short Term: Able to use daily as guideline for intensity in rehab;Long Term: Able to use THRR to govern intensity when exercising independently       Understanding  of Exercise Prescription Yes       Intervention Provide education, explanation, and written materials on patient's individual exercise prescription       Expected Outcomes Short Term: Able to explain program exercise prescription;Long Term: Able  to explain home exercise prescription to exercise independently                Exercise Goals Re-Evaluation :  Exercise Goals Re-Evaluation     Row Name 07/14/21 1212 07/23/21 1126 07/28/21 1105         Exercise Goal Re-Evaluation   Exercise Goals Review Increase Physical Activity;Able to understand and use rate of perceived exertion (RPE) scale Increase Physical Activity;Able to understand and use rate of perceived exertion (RPE) scale;Understanding of Exercise Prescription;Increase Strength and Stamina;Knowledge and understanding of Target Heart Rate Range (THRR);Able to check pulse independently Increase Physical Activity;Able to understand and use rate of perceived exertion (RPE) scale;Understanding of Exercise Prescription;Increase Strength and Stamina;Knowledge and understanding of Target Heart Rate Range (THRR);Able to check pulse independently     Comments Patient able to understand and use RPE scale appropriately. Patient is walking 10-15 minutes, 2 days/week as her mode of home exercise. Discussed increasing duration or frequency to achieve 150 minutes of aerobic exercise/week, and patient is agreeable to this. Walking is limited by low stamina and left knee pain. Patient is a member at [employer] and previously used the elliptical machine there as well as stretching and weight training. Patient will exercise on Tuesdays and Thursdays at the fitness center in addition to exercise at cardiac rehab to help build strength and stamina. Patient will hold weight lifing at fitness center until cleared by her physician. Patient has a smart watch to monitor her pulse. Patient will hold weight lifing at fitness center until cleared by her physician.  Patient feels that her strength is better, and her stamina is a little better since starting the cardiac rehab program. Patient did a light workout at [facility] [employer] for the first time since starting the program last Thursday and tolerated well. Patient did not use elliptical machine. Discussed increasing steps/min on the NuStep as tolerated to help increased MET average, and patient is amenable to this. Exercise is limited by chronic knee pain.     Expected Outcomes Progress workloads as tolerated to help increase strength and stamina. Patient will add 30 minutes of aerobic exericse 2 days/week in addition to exericse at cardiac rehab to help build stamina. Patient wil continue to increase SPM as tolerated to help increase MET average. Patient will exericse at fitness center 1-2 day/week in addition to exercise at [facility] to help increase stamina.              Discharge Exercise Prescription (Final Exercise Prescription Changes):  Exercise Prescription Changes - 08/11/21 1101       Response to Exercise   Blood Pressure (Admit) 124/68    Blood Pressure (Exercise) 132/74    Blood Pressure (Exit) 122/68    Heart Rate (Admit) 64 bpm    Heart Rate (Exercise) 110 bpm    Heart Rate (Exit) 70 bpm    Rating of Perceived Exertion (Exercise) 12    Symptoms none    Comments Increaed workload on NuStep.    Duration Progress to 30 minutes of  aerobic without signs/symptoms of physical distress    Intensity THRR unchanged      Progression   Progression Continue to progress workloads to maintain intensity without signs/symptoms of physical distress.    Average METs 2      Resistance Training   Training Prescription Yes    Weight 3 lbs    Reps 10-15    Time 10 Minutes      Interval Training  Interval Training No      NuStep   Level 4    SPM 75    Minutes 30    METs 2      Home Exercise Plan   Plans to continue exercise at [facility] (comment)    Frequency Add 2 additional days to  program exercise sessions.    Initial Home Exercises Provided 07/23/21             Nutrition:  Target Goals: Understanding of nutrition guidelines, daily intake of sodium <15102m, cholesterol <2042m calories 30% from fat and 7% or less from saturated fats, daily to have 5 or more servings of fruits and vegetables.  Biometrics:  Pre Biometrics - 07/10/21 0815       Pre Biometrics   Waist Circumference 44 inches    Hip Circumference 45.25 inches    Waist to Hip Ratio 0.97 %    Triceps Skinfold 28 mm    % Body Fat 43.5 %    Grip Strength 25 kg    Flexibility 14.25 in    Single Leg Stand 5.12 seconds   High fall risk             Nutrition Therapy Plan and Nutrition Goals:  Nutrition Therapy & Goals - 07/16/21 1329       Nutrition Therapy   Diet TLC    Drug/Food Interactions Statins/Certain Fruits      Personal Nutrition Goals   Nutrition Goal Pt to identify food quantities necessary to achieve weight loss of 6-24 lb at graduation from cardiac rehab.    Personal Goal #2 Pt to build a healthy plate including vegetables, fruits, whole grains, and low-fat dairy products in a heart healthy meal plan.    Personal Goal #3 Pt to follow 64 oz fluid restriction      Intervention Plan   Intervention Prescribe, educate and counsel regarding individualized specific dietary modifications aiming towards targeted core components such as weight, hypertension, lipid management, diabetes, heart failure and other comorbidities.;Nutrition handout(s) given to patient.    Expected Outcomes Short Term Goal: A plan has been developed with personal nutrition goals set during dietitian appointment.;Long Term Goal: Adherence to prescribed nutrition plan.             Nutrition Assessments:  MEDIFICTS Score Key: ?70 Need to make dietary changes  40-70 Heart Healthy Diet ? 40 Therapeutic Level Cholesterol Diet   Flowsheet Row CARDIAC REHAB PHASE II [facility] Total Score on [facility] 70      Picture Your Plate Scores: <4<45nhealthy dietary pattern with much room for improvement. 41-50 Dietary pattern unlikely to meet recommendations for good health and room for improvement. 51-60 More healthful dietary pattern, with some room for improvement.  >60 Healthy dietary pattern, although there may be some specific behaviors that could be improved.    Nutrition Goals Re-Evaluation:  Nutrition Goals Re-Evaluation     [place] 07/16/21 1330 08/07/21 1211           Goals   Current Weight 176 lb (79.8 kg) 176 lb 2.4 oz (79.9 kg)      Nutrition Goal Pt to identify food quantities necessary to achieve weight loss of 6-24 lb at graduation from cardiac rehab. Pt to identify food quantities necessary to achieve weight loss of 6-24 lb at graduation from cardiac rehab.             Personal  Goal #2 Re-Evaluation   Personal Goal #2 Pt to build a healthy plate including vegetables, fruits, whole grains, and low-fat dairy products in a heart healthy meal plan. Pt to build a healthy plate including vegetables, fruits, whole grains, and low-fat dairy products in a heart healthy meal plan.             Personal Goal #3 Re-Evaluation   Personal Goal #3 Pt to follow 64 oz fluid restriction Pt to follow 64 oz fluid restriction               Nutrition Goals Re-Evaluation:  Nutrition Goals Re-Evaluation     [place] Name 07/16/21 1330 08/07/21 1211           Goals   Current Weight 176 lb (79.8 kg) 176 lb 2.4 oz (79.9 kg)      Nutrition Goal Pt to identify food quantities necessary to achieve weight loss of 6-24 lb at graduation from cardiac rehab. Pt to identify food quantities necessary to achieve weight loss of 6-24 lb at graduation from cardiac rehab.             Personal Goal #2 Re-Evaluation   Personal Goal #2 Pt to build a healthy plate including vegetables, fruits, whole grains, and low-fat dairy  products in a heart healthy meal plan. Pt to build a healthy plate including vegetables, fruits, whole grains, and low-fat dairy products in a heart healthy meal plan.             Personal Goal #3 Re-Evaluation   Personal Goal #3 Pt to follow 64 oz fluid restriction Pt to follow 64 oz fluid restriction               Nutrition Goals Discharge (Final Nutrition Goals Re-Evaluation):  Nutrition Goals Re-Evaluation - 08/07/21 1211       Goals   Current Weight 176 lb 2.4 oz (79.9 kg)    Nutrition Goal Pt to identify food quantities necessary to achieve weight loss of 6-24 lb at graduation from cardiac rehab.      Personal Goal #2 Re-Evaluation   Personal Goal #2 Pt to build a healthy plate including vegetables, fruits, whole grains, and low-fat dairy products in a heart healthy meal plan.      Personal Goal #3 Re-Evaluation   Personal Goal #3 Pt to follow 64 oz fluid restriction             Psychosocial: Target Goals: Acknowledge presence or absence of significant depression and/or stress, maximize coping skills, provide positive support system. Participant is able to verbalize types and ability to use techniques and skills needed for reducing stress and depression.  Initial Review & Psychosocial Screening:  Initial Psych Review & Screening - 07/10/21 1246       Initial Review   Current issues with Current Sleep Concerns    Comments Due to the death of her husband and dealing with as she said "the practical side of life" she has a lot of things to take care of and get in order so there are nights she does not sleep as well due to thinking about so much. She realizes good quality sleep is important and there are some night she does get the sleep she needs.      Family Dynamics   Good Support System? Yes   Crystal Fox has a very supportive family which include her children, sisters, a niece and nephew who live next door and cousins.   Concerns Recent loss  of significant other     Comments Husband passed away when she returned from the hospital post by-pass. Parents and her brother have passed away. Support system are her Children, Sisters, Nieces, Nephews and Cousins      Barriers   Psychosocial barriers to participate in program There are no identifiable barriers or psychosocial needs.   Crystal Fox shared she does not foresee any barriers to participate and her family support her in Cardiac Rehab. She understands grief is normal and does not want it to get away from her. A friend provided her a name of a therapist who she intends to call.     Screening Interventions   Interventions Encouraged to exercise;Other (comment)    Comments Will encourage and support her follow-up with reaching out to the therapist she has intentions to call.    Expected Outcomes Short Term goal: Utilizing psychosocial counselor, staff and physician to assist with identification of specific Stressors or current issues interfering with healing process. Setting desired goal for each stressor or current issue identified.;Long Term goal: The participant improves quality of Life and PHQ9 Scores as seen by post scores and/or verbalization of changes             Quality of Life Scores:  Quality of Life - 07/10/21 0953       Quality of Life   Select Quality of Life      Quality of Life Scores   Health/Function Pre 18.71 %    Socioeconomic Pre 23.79 %    Psych/Spiritual Pre 16.92 %    Family Pre 24 %    GLOBAL Pre 20.19 %            Scores of 19 and below usually indicate a poorer quality of life in these areas.  A difference of  2-3 points is a clinically meaningful difference.  A difference of 2-3 points in the total score of the Quality of Life Index has been associated with significant improvement in overall quality of life, self-image, physical symptoms, and general health in studies assessing change in quality of life.  PHQ-9: Recent Review Flowsheet Data     Depression screen [facility] 2/9  07/10/2021   Decreased Interest 1   Down, Depressed, Hopeless 1   PHQ - 2 Score 2   Altered sleeping 1   Tired, decreased energy 0   Change in appetite 0   Feeling bad or failure about yourself  0   Trouble concentrating 0   Moving slowly or fidgety/restless 0   Suicidal thoughts 0   PHQ-9 Score 3   Difficult doing work/chores Not difficult at all      Interpretation of Total Score  Total Score Depression Severity:  1-4 = Minimal depression, 5-9 = Mild depression, 10-14 = Moderate depression, 15-19 = Moderately severe depression, 20-27 = Severe depression   Psychosocial Evaluation and Intervention:   Psychosocial Re-Evaluation:  Psychosocial Re-Evaluation     [place] Name 07/16/21 1454 08/11/21 1746           Psychosocial Re-Evaluation   Current issues with Current Sleep Concerns Current Sleep Concerns      Comments Will review quality of life questionnaire with the paitent later this week. Quality of life questionnaire was reviewed and forwarded to Dr Jacquiline Doe office. Crystal Fox continues to morn the loss of her husband had a good network of family and friends for support.      Expected Outcomes Crystal Fox will have decreased or better managed depression upon completion of  phase 2 cardiac rehab Crystal Fox will have decreased or better managed depression upon completion of phase 2 cardiac rehab      Interventions Stress management education;Encouraged to attend Cardiac Rehabilitation for the exercise;Therapist referral Stress management education;Encouraged to attend Cardiac Rehabilitation for the exercise;Therapist referral      Continue Psychosocial Services  Follow up required by staff Follow up required by staff               Psychosocial Discharge (Final Psychosocial Re-Evaluation):  Psychosocial Re-Evaluation - 08/11/21 1746       Psychosocial Re-Evaluation   Current issues with Current Sleep Concerns    Comments Quality of life questionnaire was reviewed and forwarded to Dr  Jacquiline Doe office. Crystal Fox continues to morn the loss of her husband had a good network of family and friends for support.    Expected Outcomes Crystal Fox will have decreased or better managed depression upon completion of phase 2 cardiac rehab    Interventions Stress management education;Encouraged to attend Cardiac Rehabilitation for the exercise;Therapist referral    Continue Psychosocial Services  Follow up required by staff             Vocational Rehabilitation: Provide vocational rehab assistance to qualifying candidates.   Vocational Rehab Evaluation & Intervention:   Education: Education Goals: Education classes will be provided on a weekly basis, covering required topics. Participant will state understanding/return demonstration of topics presented.  Learning Barriers/Preferences:  Learning Barriers/Preferences - 07/10/21 0934       Learning Barriers/Preferences   Learning Barriers Hearing   Pt voices some hearing loss   Learning Preferences Video;Computer/Internet;Pictoral             Education Topics: Count Your Pulse:  -Group instruction provided by verbal instruction, demonstration, patient participation and written materials to support subject.  Instructors address importance of being able to find your pulse and how to count your pulse when at home without a heart monitor.  Patients get hands on experience counting their pulse with staff help and individually.   Heart Attack, Angina, and Risk Factor Modification:  -Group instruction provided by verbal instruction, video, and written materials to support subject.  Instructors address signs and symptoms of angina and heart attacks.    Also discuss risk factors for heart disease and how to make changes to improve heart health risk factors.   Functional Fitness:  -Group instruction provided by verbal instruction, demonstration, patient participation, and written materials to support subject.  Instructors address safety  measures for doing things around the house.  Discuss how to get up and down off the floor, how to pick things up properly, how to safely get out of a chair without assistance, and balance training.   Meditation and Mindfulness:  -Group instruction provided by verbal instruction, patient participation, and written materials to support subject.  Instructor addresses importance of mindfulness and meditation practice to help reduce stress and improve awareness.  Instructor also leads participants through a meditation exercise.    Stretching for Flexibility and Mobility:  -Group instruction provided by verbal instruction, patient participation, and written materials to support subject.  Instructors lead participants through series of stretches that are designed to increase flexibility thus improving mobility.  These stretches are additional exercise for major muscle groups that are typically performed during regular warm up and cool down.   Hands Only CPR:  -Group verbal, video, and participation provides a basic overview of AHA guidelines for community CPR. Role-play of emergencies allow participants the opportunity to  practice calling for help and chest compression technique with discussion of AED use.   Hypertension: -Group verbal and written instruction that provides a basic overview of hypertension including the most recent diagnostic guidelines, risk factor reduction with self-care instructions and medication management.    Nutrition I class: Heart Healthy Eating:  -Group instruction provided by PowerPoint slides, verbal discussion, and written materials to support subject matter. The instructor gives an explanation and review of the Therapeutic Lifestyle Changes diet recommendations, which includes a discussion on lipid goals, dietary fat, sodium, fiber, plant stanol/sterol esters, sugar, and the components of a well-balanced, healthy diet.   Nutrition II class: Lifestyle Skills:  -Group  instruction provided by PowerPoint slides, verbal discussion, and written materials to support subject matter. The instructor gives an explanation and review of label reading, grocery shopping for heart health, heart healthy recipe modifications, and ways to make healthier choices when eating out.   Diabetes Question & Answer:  -Group instruction provided by PowerPoint slides, verbal discussion, and written materials to support subject matter. The instructor gives an explanation and review of diabetes co-morbidities, pre- and post-prandial blood glucose goals, pre-exercise blood glucose goals, signs, symptoms, and treatment of hypoglycemia and hyperglycemia, and foot care basics.   Diabetes Blitz:  -Group instruction provided by PowerPoint slides, verbal discussion, and written materials to support subject matter. The instructor gives an explanation and review of the physiology behind type 1 and type 2 diabetes, diabetes medications and rational behind using different medications, pre- and post-prandial blood glucose recommendations and Hemoglobin A1c goals, diabetes diet, and exercise including blood glucose guidelines for exercising safely.    Portion Distortion:  -Group instruction provided by PowerPoint slides, verbal discussion, written materials, and food models to support subject matter. The instructor gives an explanation of serving size versus portion size, changes in portions sizes over the last 20 years, and what consists of a serving from each food group.   Stress Management:  -Group instruction provided by verbal instruction, video, and written materials to support subject matter.  Instructors review role of stress in heart disease and how to cope with stress positively.     Exercising on Your Own:  -Group instruction provided by verbal instruction, power point, and written materials to support subject.  Instructors discuss benefits of exercise, components of exercise, frequency and  intensity of exercise, and end points for exercise.  Also discuss use of nitroglycerin and activating EMS.  Review options of places to exercise outside of rehab.  Review guidelines for sex with heart disease.   Cardiac Drugs I:  -Group instruction provided by verbal instruction and written materials to support subject.  Instructor reviews cardiac drug classes: antiplatelets, anticoagulants, beta blockers, and statins.  Instructor discusses reasons, side effects, and lifestyle considerations for each drug class.   Cardiac Drugs II:  -Group instruction provided by verbal instruction and written materials to support subject.  Instructor reviews cardiac drug classes: angiotensin converting enzyme inhibitors (ACE-I), angiotensin II receptor blockers (ARBs), nitrates, and calcium channel blockers.  Instructor discusses reasons, side effects, and lifestyle considerations for each drug class.   Anatomy and Physiology of the Circulatory System:  Group verbal and written instruction and models provide basic cardiac anatomy and physiology, with the coronary electrical and arterial systems. Review of: AMI, Angina, Valve disease, Heart Failure, Peripheral Artery Disease, Cardiac Arrhythmia, Pacemakers, and the ICD.   Other Education:  -Group or individual verbal, written, or video instructions that support the educational goals of the cardiac rehab program.  Holiday Eating Survival Tips:  -Group instruction provided by PowerPoint slides, verbal discussion, and written materials to support subject matter. The instructor gives patients tips, tricks, and techniques to help them not only survive but enjoy the holidays despite the onslaught of food that accompanies the holidays.   Knowledge Questionnaire Score:  Knowledge Questionnaire Score - 07/10/21 0954       Knowledge Questionnaire Score   Pre Score 22/24             Core Components/Risk Factors/Patient Goals at Admission:  Personal Goals  and Risk Factors at Admission - 07/10/21 0936       Core Components/Risk Factors/Patient Goals on Admission    Weight Management Yes;Obesity;Weight Loss    Intervention Weight Management: Develop a combined nutrition and exercise program designed to reach desired caloric intake, while maintaining appropriate intake of nutrient and fiber, sodium and fats, and appropriate energy expenditure required for the weight goal.;Weight Management: Provide education and appropriate resources to help participant work on and attain dietary goals.;Weight Management/Obesity: Establish reasonable short term and long term weight goals.;Obesity: Provide education and appropriate resources to help participant work on and attain dietary goals.    Admit Weight 176 lb 9.4 oz (80.1 kg)    Expected Outcomes Short Term: Continue to assess and modify interventions until short term weight is achieved;Long Term: Adherence to nutrition and physical activity/exercise program aimed toward attainment of established weight goal;Weight Maintenance: Understanding of the daily nutrition guidelines, which includes 25-35% calories from fat, 7% or less cal from saturated fats, less than 221m cholesterol, less than 1.5gm of sodium, & 5 or more servings of fruits and vegetables daily;Weight Loss: Understanding of general recommendations for a balanced deficit meal plan, which promotes 1-2 lb weight loss per week and includes a negative energy balance of [phone] kcal/d;Understanding recommendations for meals to include 15-35% energy as protein, 25-35% energy from fat, 35-60% energy from carbohydrates, less than 2069mof dietary cholesterol, 20-35 gm of total fiber daily;Understanding of distribution of calorie intake throughout the day with the consumption of 4-5 meals/snacks    Diabetes Yes    Intervention Provide education about signs/symptoms and action to take for hypo/hyperglycemia.;Provide education about proper nutrition, including  hydration, and aerobic/resistive exercise prescription along with prescribed medications to achieve blood glucose in normal ranges: Fasting glucose 65-99 mg/dL    Expected Outcomes Short Term: Participant verbalizes understanding of the signs/symptoms and immediate care of hyper/hypoglycemia, proper foot care and importance of medication, aerobic/resistive exercise and nutrition plan for blood glucose control.;Long Term: Attainment of HbA1C < 7%.    Hypertension Yes    Intervention Provide education on lifestyle modifcations including regular physical activity/exercise, weight management, moderate sodium restriction and increased consumption of fresh fruit, vegetables, and low fat dairy, alcohol moderation, and smoking cessation.;Monitor prescription use compliance.    Expected Outcomes Short Term: Continued assessment and intervention until BP is < 140/9041mG in hypertensive participants. < 130/29m47m in hypertensive participants with diabetes, heart failure or chronic kidney disease.;Long Term: Maintenance of blood pressure at goal levels.    Lipids Yes    Intervention Provide education and support for participant on nutrition & aerobic/resistive exercise along with prescribed medications to achieve LDL <70mg15mL >40mg.73mExpected Outcomes Short Term: Participant states understanding of desired cholesterol values and is compliant with medications prescribed. Participant is following exercise prescription and nutrition guidelines.;Long Term: Cholesterol controlled with medications as prescribed, with individualized exercise RX and with personalized nutrition plan. Value goals:  LDL < 35m, HDL > 40 mg.    Stress Yes    Intervention Offer individual and/or small group education and counseling on adjustment to heart disease, stress management and health-related lifestyle change. Teach and support self-help strategies.;Refer participants experiencing significant psychosocial distress to appropriate mental  health specialists for further evaluation and treatment. When possible, include family members and significant others in education/counseling sessions.    Expected Outcomes Short Term: Participant demonstrates changes in health-related behavior, relaxation and other stress management skills, ability to obtain effective social support, and compliance with psychotropic medications if prescribed.;Long Term: Emotional wellbeing is indicated by absence of clinically significant psychosocial distress or social isolation.             Core Components/Risk Factors/Patient Goals Review:   Goals and Risk Factor Review     Row Name 07/16/21 1547 08/11/21 1749           Core Components/Risk Factors/Patient Goals Review   Personal Goals Review Weight Management/Obesity;Hypertension;Lipids;Diabetes;Stress Weight Management/Obesity;Hypertension;Lipids;Diabetes;Stress      Review Crystal Fox is off to a good start to exercise this week. Vital signs and CBG's have been stable. Crystal Fox continues to do well with exercise at phase 2 CR. Vital signs and CBG's remain stable.      Expected Outcomes Crystal Fox will continue to participate in phase 2 cardiac rehab for exercise, nutrition and lifestyle modifications Crystal Fox will continue to participate in phase 2 cardiac rehab for exercise, nutrition and lifestyle modifications               Core Components/Risk Factors/Patient Goals at Discharge (Final Review):   Goals and Risk Factor Review - 08/11/21 1749       Core Components/Risk Factors/Patient Goals Review   Personal Goals Review Weight Management/Obesity;Hypertension;Lipids;Diabetes;Stress    Review Crystal Fox continues to do well with exercise at phase 2 CR. Vital signs and CBG's remain stable.    Expected Outcomes Crystal Fox will continue to participate in phase 2 cardiac rehab for exercise, nutrition and lifestyle modifications             ITP Comments:  ITP Comments     Row Name 07/16/21 1445 07/16/21 1446  08/11/21 1744       ITP Comments Dr TFransico HimMD, Medical Director 30 Day ITP Review. Crystal Fox started exercise on 07/14/21 and is off to a good start to exercise 30 Day ITP Review. Crystal Fox started exercise on 07/14/21 and is off to a good attendance and participation in phase 2 cardiac rehab.              Comments: See ITP Comments

## 2021-08-13 ENCOUNTER — Encounter (HOSPITAL_COMMUNITY)
Admission: RE | Admit: 2021-08-13 | Discharge: 2021-08-13 | Disposition: A | Discharge status: Home / Self Care | Payer: PPO | Source: Ambulatory Visit | Attending: Cardiology | Admitting: Cardiology

## 2021-08-13 ENCOUNTER — Other Ambulatory Visit: Payer: Self-pay

## 2021-08-13 DIAGNOSIS — Z951 Presence of aortocoronary bypass graft: Secondary | ICD-10-CM

## 2021-08-13 DIAGNOSIS — Z48812 Encounter for surgical aftercare following surgery on the circulatory system: Secondary | ICD-10-CM | POA: Diagnosis not present

## 2021-08-15 ENCOUNTER — Other Ambulatory Visit: Payer: Self-pay

## 2021-08-15 ENCOUNTER — Encounter (HOSPITAL_COMMUNITY)
Admission: RE | Admit: 2021-08-15 | Discharge: 2021-08-15 | Disposition: A | Discharge status: Home / Self Care | Payer: PPO | Source: Ambulatory Visit | Attending: Cardiology | Admitting: Cardiology

## 2021-08-15 ENCOUNTER — Other Ambulatory Visit: Payer: Self-pay | Admitting: Family

## 2021-08-15 DIAGNOSIS — Z951 Presence of aortocoronary bypass graft: Secondary | ICD-10-CM

## 2021-08-15 DIAGNOSIS — Z48812 Encounter for surgical aftercare following surgery on the circulatory system: Secondary | ICD-10-CM | POA: Diagnosis not present

## 2021-08-16 ENCOUNTER — Other Ambulatory Visit: Payer: Self-pay | Admitting: Family

## 2021-08-18 ENCOUNTER — Encounter (HOSPITAL_COMMUNITY)
Admission: RE | Admit: 2021-08-18 | Discharge: 2021-08-18 | Disposition: A | Discharge status: Home / Self Care | Payer: PPO | Source: Ambulatory Visit | Attending: Cardiology | Admitting: Cardiology

## 2021-08-18 ENCOUNTER — Other Ambulatory Visit: Payer: Self-pay

## 2021-08-18 DIAGNOSIS — Z951 Presence of aortocoronary bypass graft: Secondary | ICD-10-CM

## 2021-08-18 DIAGNOSIS — Z48812 Encounter for surgical aftercare following surgery on the circulatory system: Secondary | ICD-10-CM | POA: Diagnosis not present

## 2021-08-20 ENCOUNTER — Other Ambulatory Visit: Payer: Self-pay

## 2021-08-20 ENCOUNTER — Encounter (HOSPITAL_COMMUNITY)
Admission: RE | Admit: 2021-08-20 | Discharge: 2021-08-20 | Disposition: A | Discharge status: Home / Self Care | Payer: PPO | Source: Ambulatory Visit | Attending: Cardiology | Admitting: Cardiology

## 2021-08-20 DIAGNOSIS — Z951 Presence of aortocoronary bypass graft: Secondary | ICD-10-CM

## 2021-08-20 DIAGNOSIS — Z48812 Encounter for surgical aftercare following surgery on the circulatory system: Secondary | ICD-10-CM | POA: Diagnosis not present

## 2021-08-22 ENCOUNTER — Other Ambulatory Visit: Payer: Self-pay

## 2021-08-22 ENCOUNTER — Encounter (HOSPITAL_COMMUNITY)
Admission: RE | Admit: 2021-08-22 | Discharge: 2021-08-22 | Disposition: A | Discharge status: Home / Self Care | Payer: PPO | Source: Ambulatory Visit | Attending: Cardiology | Admitting: Cardiology

## 2021-08-22 DIAGNOSIS — Z48812 Encounter for surgical aftercare following surgery on the circulatory system: Secondary | ICD-10-CM | POA: Diagnosis not present

## 2021-08-22 DIAGNOSIS — Z951 Presence of aortocoronary bypass graft: Secondary | ICD-10-CM

## 2021-08-25 ENCOUNTER — Other Ambulatory Visit: Payer: Self-pay

## 2021-08-25 ENCOUNTER — Encounter (HOSPITAL_COMMUNITY)
Admission: RE | Admit: 2021-08-25 | Discharge: 2021-08-25 | Disposition: A | Discharge status: Home / Self Care | Payer: PPO | Source: Ambulatory Visit | Attending: Cardiology | Admitting: Cardiology

## 2021-08-25 DIAGNOSIS — Z951 Presence of aortocoronary bypass graft: Secondary | ICD-10-CM

## 2021-08-25 DIAGNOSIS — Z48812 Encounter for surgical aftercare following surgery on the circulatory system: Secondary | ICD-10-CM | POA: Diagnosis not present

## 2021-08-26 DIAGNOSIS — I4891 Unspecified atrial fibrillation: Secondary | ICD-10-CM | POA: Diagnosis not present

## 2021-08-27 ENCOUNTER — Telehealth: Payer: Self-pay | Admitting: *Deleted

## 2021-08-27 ENCOUNTER — Other Ambulatory Visit: Payer: Self-pay

## 2021-08-27 ENCOUNTER — Encounter (HOSPITAL_COMMUNITY)
Admission: RE | Admit: 2021-08-27 | Discharge: 2021-08-27 | Disposition: A | Discharge status: Home / Self Care | Payer: PPO | Source: Ambulatory Visit | Attending: Cardiology | Admitting: Cardiology

## 2021-08-27 DIAGNOSIS — Z951 Presence of aortocoronary bypass graft: Secondary | ICD-10-CM

## 2021-08-27 DIAGNOSIS — Z48812 Encounter for surgical aftercare following surgery on the circulatory system: Secondary | ICD-10-CM | POA: Diagnosis not present

## 2021-08-27 NOTE — Telephone Encounter (Signed)
Left message of results for pt  

## 2021-08-27 NOTE — Telephone Encounter (Signed)
-----   Message from Lelon Perla, MD sent at 08/27/2021  8:28 AM EDT ----- No atrial fibrillation noted.  Can discontinue amiodarone.  Make sure patient has follow-up with me. Kirk Ruths, MD

## 2021-08-29 ENCOUNTER — Other Ambulatory Visit: Payer: Self-pay

## 2021-08-29 ENCOUNTER — Encounter (HOSPITAL_COMMUNITY)
Admission: RE | Admit: 2021-08-29 | Discharge: 2021-08-29 | Disposition: A | Discharge status: Home / Self Care | Payer: PPO | Source: Ambulatory Visit | Attending: Cardiology | Admitting: Cardiology

## 2021-08-29 DIAGNOSIS — Z951 Presence of aortocoronary bypass graft: Secondary | ICD-10-CM | POA: Insufficient documentation

## 2021-09-02 ENCOUNTER — Telehealth: Payer: Self-pay | Admitting: Cardiology

## 2021-09-02 MED ORDER — APIXABAN 5 MG PO TABS: 5.0000 mg | ORAL_TABLET | Freq: Two times a day (BID) | ORAL | 3 refills | Status: DC

## 2021-09-02 NOTE — Telephone Encounter (Signed)
Spoke to patient. She states she had 30 - 45 sec - afib . She states she did capture a reading on  her smartwatch.  She is unable to upload strip to MyChart.  Patient states this is the first time since  surgery in May 2022. She has felt herself go into atrial fib.   She states she did take her regular dose of  Metoprolol 25 mg  this morning . She takes it twice a day.   Blood pressure  today is 149/87. She will be graduating from rehab this Friday.    Patient did wear a monitor  July to Aug 2022- showed no atrial fib  and Amiodarone was discontinue.  She has an follow up appt 09/19/21 with Dr Stanford Breed in regards to monitor report.    Patient aware will defer to Dr Stanford Breed and contact her back

## 2021-09-02 NOTE — Telephone Encounter (Signed)
Patient c/o Palpitations:  High priority if patient c/o lightheadedness, shortness of breath, or chest pain  How long have you had palpitations/irregular HR/ Afib? Are you having the symptoms now?  Patient states she went into afib this morning for only about 30-45 seconds. She states she states she is no longer in afib. After the 30-45 seconds she went back into sinus rhythm.  Are you currently experiencing lightheadedness, SOB or CP?  No   Do you have a history of afib (atrial fibrillation) or irregular heart rhythm?  Yes   Have you checked your BP or HR? (document readings if available):  HR: 72  Are you experiencing any other symptoms?  No

## 2021-09-02 NOTE — Telephone Encounter (Signed)
   Add apixaban 5 mg twice daily.  Bring strip when she returns to the office.  Follow-up with me as scheduled.  Kirk Ruths   RN spoke to patient . She is aware to start medication    She wanted to know if she should continue the aspirin 81 mg .  Will defer back to Dr Stanford Breed

## 2021-09-03 ENCOUNTER — Other Ambulatory Visit: Payer: Self-pay

## 2021-09-03 ENCOUNTER — Encounter (HOSPITAL_COMMUNITY)
Admission: RE | Admit: 2021-09-03 | Discharge: 2021-09-03 | Disposition: A | Discharge status: Home / Self Care | Payer: PPO | Source: Ambulatory Visit | Attending: Cardiology | Admitting: Cardiology

## 2021-09-03 DIAGNOSIS — Z951 Presence of aortocoronary bypass graft: Secondary | ICD-10-CM

## 2021-09-03 NOTE — Progress Notes (Signed)
Patient reports that she had Atrial Fib yesterday morning. Atticus called Dr Jacalyn Lefevre office and was placed on Eliquis 5 mg po BID. Telemetry rhythm Sinus 67 this morning. BP 122/60. Patient asymptomatic. Sande Rives [facility] paged and notified. Callie siad that Kataliyah is okay to proceed with exercise. Patient exercised without difficulty.Barnet Pall, RN,BSN 09/03/2021 12:21 PM

## 2021-09-05 ENCOUNTER — Encounter (HOSPITAL_COMMUNITY)
Admission: RE | Admit: 2021-09-05 | Discharge: 2021-09-05 | Disposition: A | Discharge status: Home / Self Care | Payer: PPO | Source: Ambulatory Visit | Attending: Cardiology | Admitting: Cardiology

## 2021-09-05 ENCOUNTER — Other Ambulatory Visit: Payer: Self-pay

## 2021-09-05 VITALS — BP 126/68 | HR 79 | Ht 64.25 in | Wt 170.4 lb

## 2021-09-05 DIAGNOSIS — Z951 Presence of aortocoronary bypass graft: Secondary | ICD-10-CM

## 2021-09-05 NOTE — Progress Notes (Signed)
Discharge Progress Report  Patient Details  Name: Crystal Fox MRN: [id number] Date of Birth: 1951/01/22 Referring Provider:   Flowsheet Row CARDIAC REHAB PHASE II ORIENTATION from 07/10/2021 in [facility]  Referring Provider Fae Pippin, MD        Number of Visits: 22  Reason for Discharge:  Patient reached a stable level of exercise. Patient independent in their exercise. Patient has met program and personal goals.  Smoking History:  Social History   Tobacco Use  Smoking Status Never  Smokeless Tobacco Never    Diagnosis:  S/P CABG x 3  ADL UCSD:   Initial Exercise Prescription:  Initial Exercise Prescription - 07/10/21 0900       Date of Initial Exercise RX and Referring Provider   Date 07/10/21    Referring Provider Fae Pippin, MD    Expected Discharge Date 09/05/21      Recumbant Bike   Level 1.8    RPM 60    Minutes 15    METs 1.9      NuStep   Level 2    SPM 75    Minutes 15    METs 2      Prescription Details   Frequency (times per week) 3    Duration Progress to 30 minutes of continuous aerobic without signs/symptoms of physical distress      Intensity   THRR 40-80% of Max Heartrate 61-122    Ratings of Perceived Exertion 11-13    Perceived Dyspnea 0-4      Progression   Progression Continue progressive overload as per policy without signs/symptoms or physical distress.      Resistance Training   Training Prescription Yes    Weight 3 lbs    Reps 10-15             Discharge Exercise Prescription (Final Exercise Prescription Changes):  Exercise Prescription Changes - 09/05/21 1102       Response to Exercise   Blood Pressure (Admit) 126/68    Blood Pressure (Exercise) 128/78    Blood Pressure (Exit) 108/62    Heart Rate (Admit) 79 bpm    Heart Rate (Exercise) 91 bpm    Heart Rate (Exit) 79 bpm    Rating of Perceived Exertion (Exercise) 11    Symptoms none    Comments Left knee  stiff today.    Duration Progress to 30 minutes of  aerobic without signs/symptoms of physical distress    Intensity THRR unchanged      Progression   Progression Continue to progress workloads to maintain intensity without signs/symptoms of physical distress.    Average METs 2      Resistance Training   Training Prescription Yes    Weight 4 lbs    Reps 10-15    Time 10 Minutes      Interval Training   Interval Training No      NuStep   Level 4   decreased WL today because of knee stiffness   SPM 85    Minutes 30    METs 2      Home Exercise Plan   Plans to continue exercise at [facility] (comment)    Frequency Add 2 additional days to program exercise sessions.    Initial Home Exercises Provided 07/23/21             Functional Capacity:  6 Minute Walk     Row Name 07/10/21 [phone] 08/29/21 1054  6 Minute Walk   Phase Initial Discharge    Distance 1037 feet 1119 feet    Distance % Change -- 7.91 %    Distance Feet Change -- 82 ft    Walk Time 6 minutes 6 minutes    # of Rest Breaks 0 0    MPH 1.96 2.12    METS 2.02 2.33    RPE 11 11    Perceived Dyspnea  0 0    VO2 Peak 7.06 8.16    Symptoms Yes (comment) Yes (comment)    Comments Chronic left knee pain  3.5/10 Chronic left knee pain  5/10    Resting HR 68 bpm 72 bpm    Resting BP 114/80 120/66    Resting Oxygen Saturation  98 % --    Exercise Oxygen Saturation  during 6 min walk 97 % --    Max Ex. HR 87 bpm 83 bpm    Max Ex. BP 124/78 132/62    2 Minute Post BP 110/64 110/60             Psychological, QOL, Others - Outcomes: PHQ 2/9: Depression screen [facility] 2/9 09/12/2021 07/10/2021  Decreased Interest 0 1  Down, Depressed, Hopeless 0 1  PHQ - 2 Score 0 2  Altered sleeping - 1  Tired, decreased energy - 0  Change in appetite - 0  Feeling bad or failure about yourself  - 0  Trouble concentrating - 0  Moving slowly or fidgety/restless - 0  Suicidal thoughts - 0  PHQ-9 Score - 3   Difficult doing work/chores - Not difficult at all    Quality of Life:  Quality of Life - 09/05/21 1023       Quality of Life   Select Quality of Life      Quality of Life Scores   Health/Function Pre 18.71 %    Health/Function Post 22.57 %    Health/Function % Change 20.63 %    Socioeconomic Pre 23.79 %    Socioeconomic Post 26.5 %    Socioeconomic % Change  11.39 %    Psych/Spiritual Pre 16.92 %    Psych/Spiritual Post 23 %    Psych/Spiritual % Change 35.93 %    Family Pre 24 %    Family Post 25.5 %    Family % Change 6.25 %    GLOBAL Pre 20.19 %    GLOBAL Post 23.86 %    GLOBAL % Change 18.18 %             Personal Goals: Goals established at orientation with interventions provided to work toward goal.  Personal Goals and Risk Factors at Admission - 07/10/21 0936       Core Components/Risk Factors/Patient Goals on Admission    Weight Management Yes;Obesity;Weight Loss    Intervention Weight Management: Develop a combined nutrition and exercise program designed to reach desired caloric intake, while maintaining appropriate intake of nutrient and fiber, sodium and fats, and appropriate energy expenditure required for the weight goal.;Weight Management: Provide education and appropriate resources to help participant work on and attain dietary goals.;Weight Management/Obesity: Establish reasonable short term and long term weight goals.;Obesity: Provide education and appropriate resources to help participant work on and attain dietary goals.    Admit Weight 176 lb 9.4 oz (80.1 kg)    Expected Outcomes Short Term: Continue to assess and modify interventions until short term weight is achieved;Long Term: Adherence to nutrition and physical activity/exercise program aimed toward attainment of  established weight goal;Weight Maintenance: Understanding of the daily nutrition guidelines, which includes 25-35% calories from fat, 7% or less cal from saturated fats, less than 248m  cholesterol, less than 1.5gm of sodium, & 5 or more servings of fruits and vegetables daily;Weight Loss: Understanding of general recommendations for a balanced deficit meal plan, which promotes 1-2 lb weight loss per week and includes a negative energy balance of [phone] kcal/d;Understanding recommendations for meals to include 15-35% energy as protein, 25-35% energy from fat, 35-60% energy from carbohydrates, less than 2094mof dietary cholesterol, 20-35 gm of total fiber daily;Understanding of distribution of calorie intake throughout the day with the consumption of 4-5 meals/snacks    Diabetes Yes    Intervention Provide education about signs/symptoms and action to take for hypo/hyperglycemia.;Provide education about proper nutrition, including hydration, and aerobic/resistive exercise prescription along with prescribed medications to achieve blood glucose in normal ranges: Fasting glucose 65-99 mg/dL    Expected Outcomes Short Term: Participant verbalizes understanding of the signs/symptoms and immediate care of hyper/hypoglycemia, proper foot care and importance of medication, aerobic/resistive exercise and nutrition plan for blood glucose control.;Long Term: Attainment of HbA1C < 7%.    Hypertension Yes    Intervention Provide education on lifestyle modifcations including regular physical activity/exercise, weight management, moderate sodium restriction and increased consumption of fresh fruit, vegetables, and low fat dairy, alcohol moderation, and smoking cessation.;Monitor prescription use compliance.    Expected Outcomes Short Term: Continued assessment and intervention until BP is < 140/9086mG in hypertensive participants. < 130/60m61m in hypertensive participants with diabetes, heart failure or chronic kidney disease.;Long Term: Maintenance of blood pressure at goal levels.    Lipids Yes    Intervention Provide education and support for participant on nutrition & aerobic/resistive exercise  along with prescribed medications to achieve LDL <70mg34mL >40mg.86mExpected Outcomes Short Term: Participant states understanding of desired cholesterol values and is compliant with medications prescribed. Participant is following exercise prescription and nutrition guidelines.;Long Term: Cholesterol controlled with medications as prescribed, with individualized exercise RX and with personalized nutrition plan. Value goals: LDL < 70mg, 39m> 40 mg.    Stress Yes    Intervention Offer individual and/or small group education and counseling on adjustment to heart disease, stress management and health-related lifestyle change. Teach and support self-help strategies.;Refer participants experiencing significant psychosocial distress to appropriate mental health specialists for further evaluation and treatment. When possible, include family members and significant others in education/counseling sessions.    Expected Outcomes Short Term: Participant demonstrates changes in health-related behavior, relaxation and other stress management skills, ability to obtain effective social support, and compliance with psychotropic medications if prescribed.;Long Term: Emotional wellbeing is indicated by absence of clinically significant psychosocial distress or social isolation.              Personal Goals Discharge:  Goals and Risk Factor Review     Row Name 07/16/21 1547 08/11/21 1749 09/12/21 0900         Core Components/Risk Factors/Patient Goals Review   Personal Goals Review Weight Management/Obesity;Hypertension;Lipids;Diabetes;Stress Weight Management/Obesity;Hypertension;Lipids;Diabetes;Stress Weight Management/Obesity;Hypertension;Lipids;Diabetes;Stress     Review Johniya is off to a good start to exercise this week. Vital signs and CBG's have been stable. Sheralee continues to do well with exercise at phase 2 CR. Vital signs and CBG's remain stable. Shandelle did  well with exercise at phase 2 CR. Vital signs  and CBG's remain stable.Payton completed phase 2 cardiac rehab on  09/05/21.     Expected  Outcomes Selda will continue to participate in phase 2 cardiac rehab for exercise, nutrition and lifestyle modifications Maliha will continue to participate in phase 2 cardiac rehab for exercise, nutrition and lifestyle modifications Markitta will continue to  exercise, follow  nutrition and lifestyle modifications upon completion of phase 2 cardiac rehab.              Exercise Goals and Review:  Exercise Goals     Row Name 07/10/21 0940             Exercise Goals   Increase Physical Activity Yes       Intervention Provide advice, education, support and counseling about physical activity/exercise needs.;Develop an individualized exercise prescription for aerobic and resistive training based on initial evaluation findings, risk stratification, comorbidities and participant's personal goals.       Expected Outcomes Short Term: Attend rehab on a regular basis to increase amount of physical activity.;Long Term: Add in home exercise to make exercise part of routine and to increase amount of physical activity.;Long Term: Exercising regularly at least 3-5 days a week.       Increase Strength and Stamina Yes       Intervention Provide advice, education, support and counseling about physical activity/exercise needs.;Develop an individualized exercise prescription for aerobic and resistive training based on initial evaluation findings, risk stratification, comorbidities and participant's personal goals.       Expected Outcomes Short Term: Increase workloads from initial exercise prescription for resistance, speed, and METs.;Short Term: Perform resistance training exercises routinely during rehab and add in resistance training at home;Long Term: Improve cardiorespiratory fitness, muscular endurance and strength as measured by increased METs and functional capacity (6MWT)       Able to understand and use rate of  perceived exertion (RPE) scale Yes       Intervention Provide education and explanation on how to use RPE scale       Expected Outcomes Short Term: Able to use RPE daily in rehab to express subjective intensity level;Long Term:  Able to use RPE to guide intensity level when exercising independently       Knowledge and understanding of Target Heart Rate Range (THRR) Yes       Intervention Provide education and explanation of THRR including how the numbers were predicted and where they are located for reference       Expected Outcomes Short Term: Able to state/look up THRR;Short Term: Able to use daily as guideline for intensity in rehab;Long Term: Able to use THRR to govern intensity when exercising independently       Understanding of Exercise Prescription Yes       Intervention Provide education, explanation, and written materials on patient's individual exercise prescription       Expected Outcomes Short Term: Able to explain program exercise prescription;Long Term: Able to explain home exercise prescription to exercise independently                Exercise Goals Re-Evaluation:  Exercise Goals Re-Evaluation     Row Name 07/14/21 1212 07/23/21 1126 07/28/21 1105 08/25/21 1116 08/29/21 1102     Exercise Goal Re-Evaluation   Exercise Goals Review Increase Physical Activity;Able to understand and use rate of perceived exertion (RPE) scale Increase Physical Activity;Able to understand and use rate of perceived exertion (RPE) scale;Understanding of Exercise Prescription;Increase Strength and Stamina;Knowledge and understanding of Target Heart Rate Range (THRR);Able to check pulse independently Increase Physical Activity;Able to understand and use rate of perceived exertion (RPE)  scale;Understanding of Exercise Prescription;Increase Strength and Stamina;Knowledge and understanding of Target Heart Rate Range (THRR);Able to check pulse independently Increase Physical Activity;Able to understand and  use rate of perceived exertion (RPE) scale;Understanding of Exercise Prescription;Increase Strength and Stamina;Knowledge and understanding of Target Heart Rate Range (THRR);Able to check pulse independently Increase Physical Activity;Able to understand and use rate of perceived exertion (RPE) scale;Understanding of Exercise Prescription;Increase Strength and Stamina;Knowledge and understanding of Target Heart Rate Range (THRR);Able to check pulse independently   Comments Patient able to understand and use RPE scale appropriately. Patient is walking 10-15 minutes, 2 days/week as her mode of home exercise. Discussed increasing duration or frequency to achieve 150 minutes of aerobic exercise/week, and patient is agreeable to this. Walking is limited by low stamina and left knee pain. Patient is a member at [facility] and previously used the elliptical machine there as well as stretching and weight training. Patient will exercise on Tuesdays and Thursdays at the fitness center in addition to exercise at cardiac rehab to help build strength and stamina. Patient will hold weight lifing at fitness center until cleared by her physician. Patient has a smart watch to monitor her pulse. Patient will hold weight lifing at fitness center until cleared by her physician. Patient feels that her strength is better, and her stamina is a little better since starting the cardiac rehab program. Patient did a light workout at [facility] for the first time since starting the program last Thursday and tolerated well. Patient did not use elliptical machine. Discussed increasing steps/min on the NuStep as tolerated to help increased MET average, and patient is amenable to this. Exercise is limited by chronic knee pain. Patient continues to progress well with exercise, moving up workloads appropriately. Patient continues to exercise with personal trainer at [place] on Tuesdays and Thursdays focusing on stretching. Patient plans to  continue exercise at the gym upon completion of the cardiac rehab program and will incorporate the elliptical machine more for aerobic exercise. Patient has a stationary bike at home, but it hurts her knees. Patient is considering getting a recumbent stepper for home use in addition to exercise at [facility]. Patient is still working on achieving weight loss goals. Patient's functional capacity increased 8% as measured by 6MWT. Patient feels strength is about the same. Patient goes to [facility] on Tuesdays and Thursdays and does stretching and strength training with a trainer. Patient still has issues with her balance.   Expected Outcomes Progress workloads as tolerated to help increase strength and stamina. Patient will add 30 minutes of aerobic exericse 2 days/week in addition to exericse at cardiac rehab to help build stamina. Patient wil continue to increase SPM as tolerated to help increase MET average. Patient will exericse at fitness center 1-2 day/week in addition to exercise at [facility] to help increase stamina. Patient will continue to progress workloads as tolerated to help achieve personal health and fitness goals. Patient will continue to exercise at [place] upon completion of the cardiac rehab program.    [place] Name 09/05/21 1151             Exercise Goal Re-Evaluation   Exercise Goals Review Increase Physical Activity;Able to understand and use rate of perceived exertion (RPE) scale;Understanding of Exercise Prescription;Increase Strength and Stamina;Knowledge and understanding of Target Heart Rate Range (THRR);Able to check pulse independently       Comments Patient completed the cardiac rehab program and made gradual progress with exercise. Patient's stamina still not at her  personal goal, but she does feel like she's getting stronger. She plans to continue exercise at [facility], working with PT and using the elliptical machine. Patient is looking into getting a recumbent stepper for home use as  well.       Expected Outcomes Patient will exercise at [facility] 2 days/week and at home 3 days/week to help achieve personal health and fitness goals.                Nutrition & Weight - Outcomes:  Pre Biometrics - 09/05/21 1101       Pre Biometrics   Height 5' 4.25" (1.632 m)    Waist Circumference 42.25 inches    Hip Circumference 45.75 inches    Waist to Hip Ratio 0.92 %    BMI (Calculated) 29.02    Triceps Skinfold 32 mm    % Body Fat 43 %    Grip Strength 22 kg   *Different dynamometer used   Flexibility 13 in    Single Leg Stand 3.25 seconds             Post Biometrics - 08/29/21 1102        Post  Biometrics   Waist Circumference 42.25 inches    Hip Circumference 45.75 inches    Waist to Hip Ratio 0.92 %    Triceps Skinfold 32 mm    Grip Strength 22 kg   *Different dynamometer used   Flexibility 13 in    Single Leg Stand 3.25 seconds   High fall risk            Nutrition:  Nutrition Therapy & Goals - 07/16/21 1329       Nutrition Therapy   Diet TLC    Drug/Food Interactions Statins/Certain Fruits      Personal Nutrition Goals   Nutrition Goal Pt to identify food quantities necessary to achieve weight loss of 6-24 lb at graduation from cardiac rehab.    Personal Goal #2 Pt to build a healthy plate including vegetables, fruits, whole grains, and low-fat dairy products in a heart healthy meal plan.    Personal Goal #3 Pt to follow 64 oz fluid restriction      Intervention Plan   Intervention Prescribe, educate and counsel regarding individualized specific dietary modifications aiming towards targeted core components such as weight, hypertension, lipid management, diabetes, heart failure and other comorbidities.;Nutrition handout(s) given to patient.    Expected Outcomes Short Term Goal: A plan has been developed with personal nutrition goals set during dietitian appointment.;Long Term Goal: Adherence to prescribed nutrition plan.              Nutrition Discharge:   Education Questionnaire Score:  Knowledge Questionnaire Score - 09/05/21 1023       Knowledge Questionnaire Score   Pre Score 22/24    Post Score 23/24             Goals reviewed with patient; copy given to patient.Pt graduated from cardiac rehab program on 09/05/21 with completion of 22 exercise sessions in Phase II. Pt maintained good attendance and progressed nicely during his participation in rehab as evidenced by increased MET level.   Medication list reconciled. Repeat  PHQ score- 0 . Mindie admits to being less depressed since participation in the program. Shamirah continues to mourn the loss of her husband. Taygan is still looking to meet with a counselor. I encouraged Laticha to reach out to her primary care to get help in getting an appointment.  Pt has made significant lifestyle changes and should be commended for her success. Pt feels she has achieved her goals during cardiac rehab.   Pt plans to continue exercise at the [facility]. Deniqua increased her distance on her post exercise walk test by 82 feet. Kairee will  continue to work with a Physiological scientist. Michelle says she feels stronger since she participated in phase 2 cardiac rehab.We are proud of Lovette's progress!Barnet Pall, RN,BSN 09/12/2021 9:31 AM

## 2021-09-16 NOTE — Progress Notes (Signed)
HPI: Follow-up coronary artery disease and ischemic cardiomyopathy. Echocardiogram April 2022 showed ejection fraction 30 to 35%, akinesis of the mid distal anteroseptal wall and apex, grade 1 diastolic dysfunction, mild mitral and tricuspid regurgitation.  Cardiac catheterization May 2022 showed severe coronary disease.  Preoperative carotid Dopplers May 2022 showed near normal bilaterally.  Patient underwent coronary artery bypass graft on May 02, 2021 with LIMA to the LAD, saphenous vein graft to the PDA and saphenous vein graft to the ramus intermedius.  Postoperative course complicated by atrial fibrillation.  This was treated with amiodarone.  Monitor August 2022 showed sinus rhythm with PACs, brief PAT, rare PVC and rare couplet.  Patient had a questionable episode of atrial fibrillation in early September based on her watch and Eliquis was reinitiated.  Since last seen she denies any dyspnea, chest pain, palpitations or syncope.  No bleeding.  Current Outpatient Medications  Medication Sig Dispense Refill   ACCU-CHEK AVIVA PLUS test strip 1 each by Other route every morning.     Accu-Chek FastClix Lancets MISC use to self monitor blood glucose four to six times per day     ALPRAZolam (XANAX) 0.25 MG tablet Take 0.25 mg by mouth every 6 (six) hours as needed for anxiety.     apixaban (ELIQUIS) 5 MG TABS tablet Take 1 tablet (5 mg total) by mouth 2 (two) times daily. 60 tablet 3   Ascorbic Acid (VITAMIN C GUMMIES PO) Take 250 mg by mouth daily.     aspirin 81 MG chewable tablet Chew 81 mg by mouth daily.     atorvastatin (LIPITOR) 80 MG tablet Take 1 tablet (80 mg total) by mouth daily. (Patient taking differently: Take 80 mg by mouth at bedtime.) 90 tablet 3   BD PEN NEEDLE NANO U/F 32G X 4 MM MISC      Calcium Carb-Cholecalciferol (CALCIUM 500 + D3 PO) Take 1 tablet by mouth daily.     Coenzyme Q10 (COQ-10) 100 MG CAPS Take 100 mg by mouth every evening.     Estradiol (YUVAFEM) 10 MCG  TABS vaginal tablet PLACE 1 TABLET (10 MCG TOTAL) VAGINALLY 2 (TWO) TIMES A WEEK. (Patient taking differently: Place 10 mcg vaginally 2 (two) times a week. Tuesdays & Fridays) 24 tablet 3   furosemide (LASIX) 20 MG tablet TAKE 1 TABLET BY MOUTH EVERY DAY 90 tablet 2   INVOKANA 300 MG TABS Take 300 mg by mouth daily before breakfast.     latanoprost (XALATAN) 0.005 % ophthalmic solution Place 1 drop into both eyes at bedtime.     loratadine (CLARITIN) 10 MG tablet Take 10 mg by mouth daily.     metFORMIN (GLUCOPHAGE-XR) 500 MG 24 hr tablet Take 1,000 mg by mouth 2 (two) times daily with a meal.     metoprolol tartrate (LOPRESSOR) 25 MG tablet Take 1 tablet (25 mg total) by mouth 2 (two) times daily. 60 tablet 3   Multiple Vitamin (MULTIVITAMIN WITH MINERALS) TABS tablet Take 1 tablet by mouth daily at 2 PM. Multivitamin w/Omega-3     TRESIBA FLEXTOUCH 200 UNIT/ML SOPN Inject 22 Units into the skin at bedtime.  3   TRULICITY 1.5 QQ/7.6PP SOPN Inject 1.5 mg into the skin every Wednesday.  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   LIMA to LAD, SVG to ramus intermediate, SVG to PDA, EVH via right thigh    Past Surgical History:  Procedure Laterality Date   bladder tack     BREAST SURGERY  1973   -benign Lt.breast bx   CHOLECYSTECTOMY OPEN  1985   CORONARY ARTERY BYPASS GRAFT N/A 05/02/2021   Procedure: CORONARY ARTERY BYPASS GRAFTING (CABG) X THREE , USING LEFT INTERNAL MAMMARY ARTERY AND RIGHT GREATER SAPHENOUS VEIN HARVESTED ENDOSCOPICALLY;  Surgeon: Rexene Alberts, MD;   Location: [facility];  Service: Open Heart Surgery;  Laterality: N/A;   LEFT HEART CATH AND CORONARY ANGIOGRAPHY N/A 05/01/2021   Procedure: LEFT HEART CATH AND CORONARY ANGIOGRAPHY;  Surgeon: Burnell Blanks, MD;  Location: [facility];  Service: Cardiovascular;  Laterality: N/A;   TEE WITHOUT CARDIOVERSION N/A 05/02/2021   Procedure: TRANSESOPHAGEAL ECHOCARDIOGRAM (TEE);  Surgeon: Rexene Alberts, MD;  Location: [facility];  Service: Open Heart Surgery;  Laterality: N/A;    Social History   Socioeconomic History   Marital status: Married    Spouse name: Not on file   Number of children: 5   Years of education: Not on file   Highest education level: Not on file  Occupational History   Not on file  Tobacco Use   Smoking status: Never   Smokeless tobacco: Never  Vaping Use   Vaping Use: Never used  Substance and Sexual Activity   Alcohol use: No   Drug use: No   Sexual activity: Yes    Partners: Male    Birth control/protection: Post-menopausal, Other-see comments    Comment: husband vasectomy  Other Topics Concern   Not on file  Social History Narrative   Not on file   Social Determinants of Health   Financial Resource Strain: Not on file  Food Insecurity: Not on file  Transportation Needs: Not on file  Physical Activity: Not on file  Stress: Not on file  Social Connections: Not on file  Intimate Partner Violence: Not on file    Family History  Problem Relation Age of Onset   Diabetes Mother    Hypertension Mother    Stomach cancer Mother    Heart attack Father        deceased from MI   Diabetes Sister    Diabetes Brother    Hypertension Brother    Cancer Paternal Grandmother        possible colon that went to liver   Cancer Paternal Grandfather        oral cancer    ROS: no fevers or chills, productive cough, hemoptysis, dysphasia, odynophagia, melena, hematochezia, dysuria, hematuria, rash, seizure activity, orthopnea, PND, pedal edema, claudication.  Remaining systems are negative.  Physical Exam: Well-developed well-nourished in no acute distress.  Skin is warm and dry.  HEENT is normal.  Neck is supple.  Chest is clear to auscultation with normal expansion.  Cardiovascular exam is regular rate and rhythm.  Abdominal exam nontender or distended. No masses palpated. Extremities show no edema. neuro grossly intact  A/P  1 coronary artery disease status post coronary artery bypass graft-patient doing well with no chest pain.  Plan to  continue medical therapy with aspirin and statin.  2 ischemic cardiomyopathy-we will change metoprolol to Toprol 50 mg daily.  Blood pressure is now improved and we will add losartan 25 mg daily.  Can consider transitioning to [facility] in the future if blood pressure allows.  Check potassium and renal function in 1 week.  Plan repeat echocardiogram in 3 months now that she has been revascularized.  If ejection fraction less than 35% would need to consider ICD.  3 paroxysmal atrial fibrillation-patient did have atrial fibrillation in the early postoperative period.  There was a question of atrial fibrillation on her watch on September 6.  She did bring the strips today and I have reviewed and it shows sinus rhythm.  We will continue apixaban for 3 more months.  If she has no atrial fibrillation we will discontinue at next office visit.  Continue Toprol.   4 hypertension-blood pressure controlled.  Continue present medications and follow-up.  5 hyperlipidemia-continue statin  Kirk Ruths, MD

## 2021-09-19 ENCOUNTER — Ambulatory Visit: Payer: PPO | Admitting: Cardiology

## 2021-09-19 ENCOUNTER — Other Ambulatory Visit: Payer: Self-pay

## 2021-09-19 ENCOUNTER — Encounter: Payer: Self-pay | Admitting: Cardiology

## 2021-09-19 VITALS — BP 122/60 | HR 60 | Ht 64.0 in | Wt 174.8 lb

## 2021-09-19 DIAGNOSIS — E785 Hyperlipidemia, unspecified: Secondary | ICD-10-CM

## 2021-09-19 DIAGNOSIS — I255 Ischemic cardiomyopathy: Secondary | ICD-10-CM | POA: Diagnosis not present

## 2021-09-19 DIAGNOSIS — I4891 Unspecified atrial fibrillation: Secondary | ICD-10-CM | POA: Diagnosis not present

## 2021-09-19 DIAGNOSIS — I10 Essential (primary) hypertension: Secondary | ICD-10-CM

## 2021-09-19 DIAGNOSIS — I25118 Atherosclerotic heart disease of native coronary artery with other forms of angina pectoris: Secondary | ICD-10-CM

## 2021-09-19 MED ORDER — METOPROLOL SUCCINATE ER 25 MG PO TB24: 25.0000 mg | ORAL_TABLET | Freq: Every day | ORAL | 3 refills | Status: DC

## 2021-09-19 MED ORDER — LOSARTAN POTASSIUM 25 MG PO TABS: 25.0000 mg | ORAL_TABLET | Freq: Every day | ORAL | 3 refills | Status: DC

## 2021-09-19 NOTE — Patient Instructions (Signed)
Medication Instructions:   STOP METOPROLOL TARTRATE OR LOPRESSOR  START METOPROLOL SUCC ER 50 MG ONCE DAILY  START LOSARTAN 25 MG ONCE DAILY  *If you need a refill on your cardiac medications before your next appointment, please call your pharmacy*   Lab Work:  Your physician recommends that you return for lab work in: [place]  If you have labs (blood work) drawn today and your tests are completely normal, you will receive your results only by: [employer] (if you have MyChart) OR A paper copy in the mail If you have any lab test that is abnormal or we need to change your treatment, we will call you to review the results.   Testing/Procedures:  Your physician has requested that you have an echocardiogram. Echocardiography is a painless test that uses sound waves to create images of your heart. It provides your doctor with information about the size and shape of your heart and how well your heart's chambers and valves are working. This procedure takes approximately one hour. There are no restrictions for this procedure. [place] 3 MONTHS   Follow-Up: At [facility], you and your health needs are our priority.  As part of our continuing mission to provide you with exceptional heart care, we have created designated Provider Care Teams.  These Care Teams include your primary Cardiologist (physician) and Advanced Practice Providers (APPs -  Physician Assistants and Nurse Practitioners) who all work together to provide you with the care you need, when you need it.  We recommend signing up for the patient portal called "MyChart".  Sign up information is provided on this After Visit Summary.  MyChart is used to connect with patients for Virtual Visits (Telemedicine).  Patients are able to view lab/test results, encounter notes, upcoming appointments, etc.  Non-urgent messages can be sent to your provider as well.   To learn more about what you can do with  MyChart, go to [email or web address].    Your next appointment:   3 month(s)  The format for your next appointment:   In Person  Provider:   Kirk Ruths, MD

## 2021-09-24 DIAGNOSIS — F4323 Adjustment disorder with mixed anxiety and depressed mood: Secondary | ICD-10-CM | POA: Diagnosis not present

## 2021-09-25 ENCOUNTER — Other Ambulatory Visit (HOSPITAL_BASED_OUTPATIENT_CLINIC_OR_DEPARTMENT_OTHER): Payer: PPO

## 2021-09-30 DIAGNOSIS — F4323 Adjustment disorder with mixed anxiety and depressed mood: Secondary | ICD-10-CM | POA: Diagnosis not present

## 2021-10-03 DIAGNOSIS — I255 Ischemic cardiomyopathy: Secondary | ICD-10-CM | POA: Diagnosis not present

## 2021-10-03 LAB — BASIC METABOLIC PANEL
BUN/Creatinine Ratio: 18 (ref 12–28)
BUN: 17 mg/dL (ref 8–27)
CO2: 23 mmol/L (ref 20–29)
Calcium: 9.7 mg/dL (ref 8.7–10.3)
Chloride: 100 mmol/L (ref 96–106)
Creatinine, Ser: 0.97 mg/dL (ref 0.57–1.00)
Glucose: 129 mg/dL — ABNORMAL HIGH (ref 70–99)
Potassium: 4.9 mmol/L (ref 3.5–5.2)
Sodium: 139 mmol/L (ref 134–144)
eGFR: 64 mL/min/{1.73_m2} (ref >59)

## 2021-10-14 DIAGNOSIS — F4323 Adjustment disorder with mixed anxiety and depressed mood: Secondary | ICD-10-CM | POA: Diagnosis not present

## 2021-10-15 DIAGNOSIS — I25810 Atherosclerosis of coronary artery bypass graft(s) without angina pectoris: Secondary | ICD-10-CM | POA: Diagnosis not present

## 2021-10-15 DIAGNOSIS — H2513 Age-related nuclear cataract, bilateral: Secondary | ICD-10-CM | POA: Diagnosis not present

## 2021-10-15 DIAGNOSIS — Z23 Encounter for immunization: Secondary | ICD-10-CM | POA: Diagnosis not present

## 2021-10-15 DIAGNOSIS — Z794 Long term (current) use of insulin: Secondary | ICD-10-CM | POA: Diagnosis not present

## 2021-10-15 DIAGNOSIS — I10 Essential (primary) hypertension: Secondary | ICD-10-CM | POA: Diagnosis not present

## 2021-10-15 DIAGNOSIS — H524 Presbyopia: Secondary | ICD-10-CM | POA: Diagnosis not present

## 2021-10-15 DIAGNOSIS — E1169 Type 2 diabetes mellitus with other specified complication: Secondary | ICD-10-CM | POA: Diagnosis not present

## 2021-10-15 DIAGNOSIS — H401131 Primary open-angle glaucoma, bilateral, mild stage: Secondary | ICD-10-CM | POA: Diagnosis not present

## 2021-10-15 DIAGNOSIS — E113293 Type 2 diabetes mellitus with mild nonproliferative diabetic retinopathy without macular edema, bilateral: Secondary | ICD-10-CM | POA: Diagnosis not present

## 2021-10-16 ENCOUNTER — Other Ambulatory Visit (HOSPITAL_COMMUNITY): Payer: PPO

## 2021-10-17 DIAGNOSIS — Z1231 Encounter for screening mammogram for malignant neoplasm of breast: Secondary | ICD-10-CM | POA: Diagnosis not present

## 2021-10-18 IMAGING — CT CT CARDIAC CORONARY ARTERY CALCIUM SCORE
3 series · 14 of 20 positions shown, 15 images · non-contrast
Comparison: None.
COMPARISON: None.

Addendum:
EXAM:
OVER-READ INTERPRETATION  CT CHEST

The following report is an over-read performed by radiologist Dr.
Blain Jumper [REDACTED] on 04/23/2021. This
over-read does not include interpretation of cardiac or coronary
anatomy or pathology. The coronary calcium score interpretation by
the cardiologist is attached.
CLINICAL DATA: Cardiovascular Disease Risk stratification
Coronary Calcium Score
TECHNIQUE: A gated, non-contrast computed tomography scan of the heart was
performed using 3mm slice thickness. Axial images were analyzed on a
dedicated workstation. Calcium scoring of the coronary arteries was
performed using the Agatston method.

[Series 2: casc 3.0 bv41 2 bestsyst 276 ms · axial · 0.39mm/px · z∈[-234,-156]mm · 4 of 44 slices shown, 5 images]
[im 9/44  vessel]
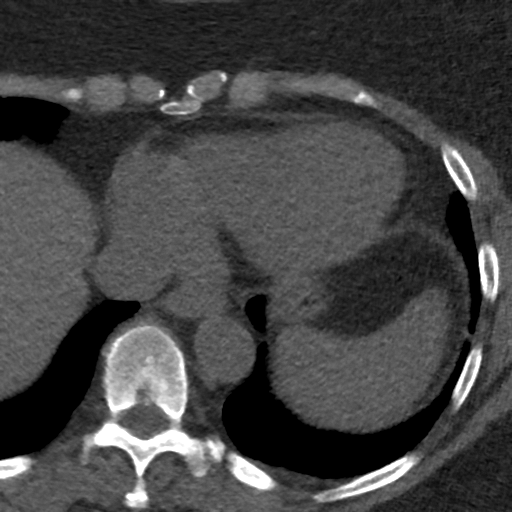
[im 9/44  lung]
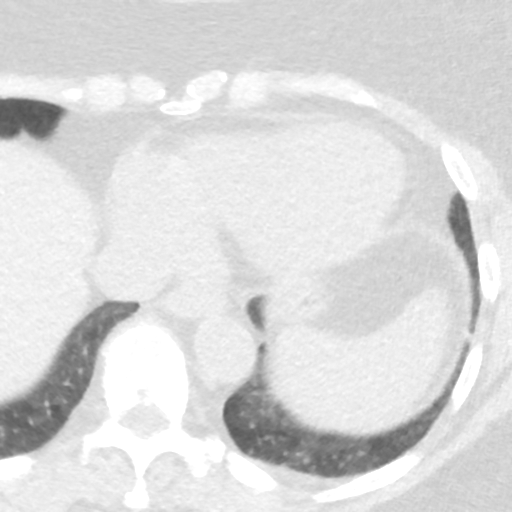
[im 18/44  vessel]
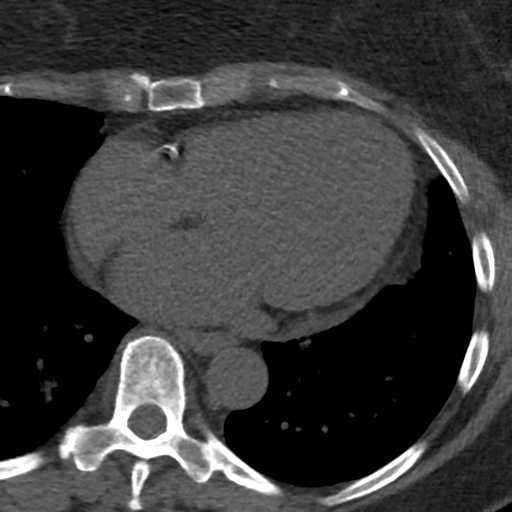
[im 26/44  vessel]
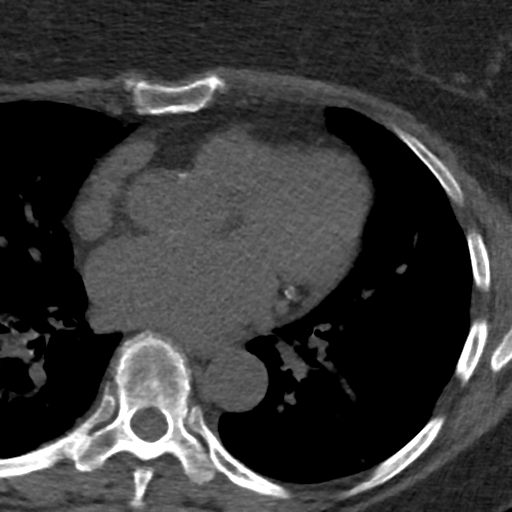
[im 35/44  vessel]
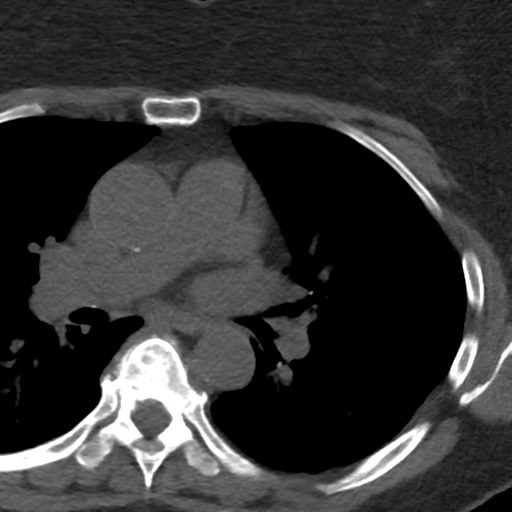

[Series 3: lung 288 ms · axial · 0.68mm/px · z∈[-237,-153]mm · 5 of 44 slices shown]
[im 8/44  lung]
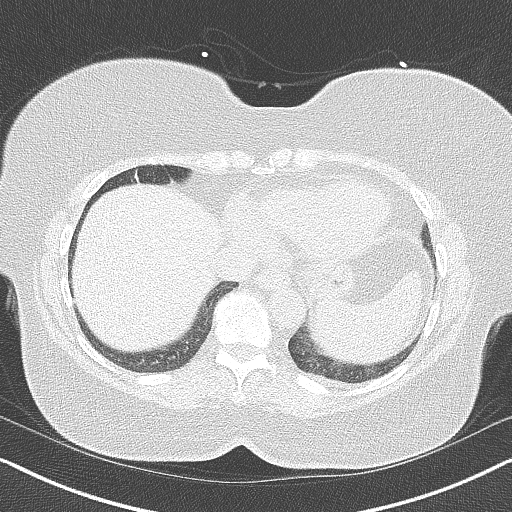
[im 15/44  lung]
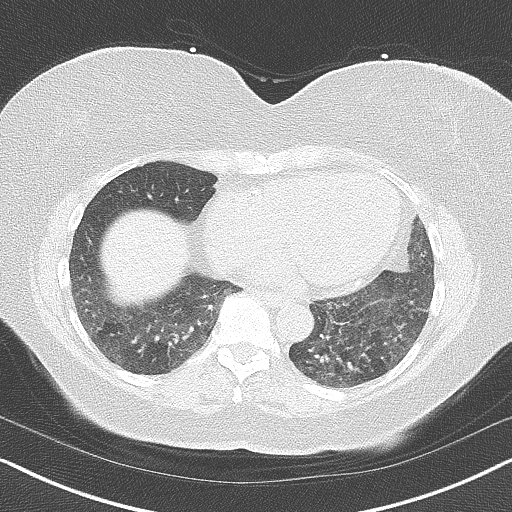
[im 22/44  lung]
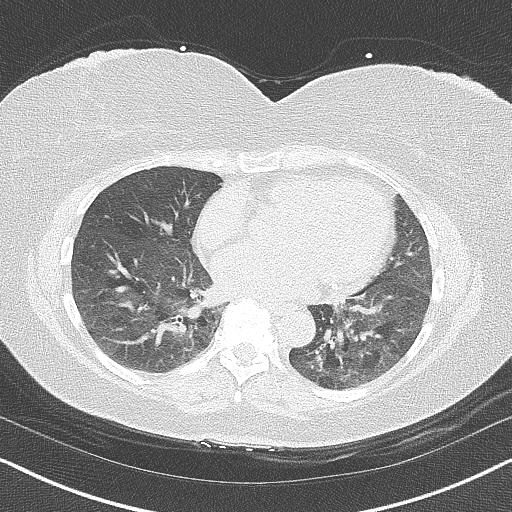
[im 29/44  lung]
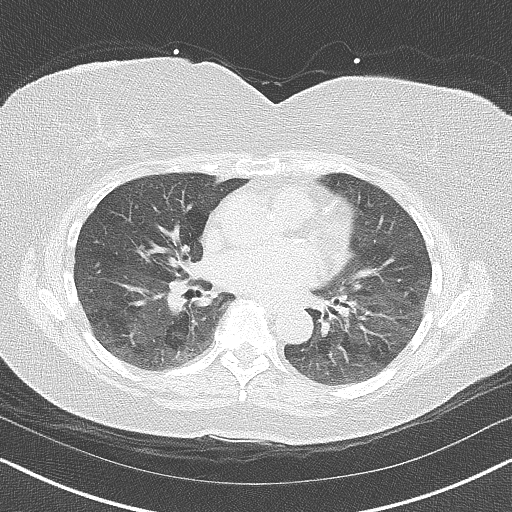
[im 36/44  lung]
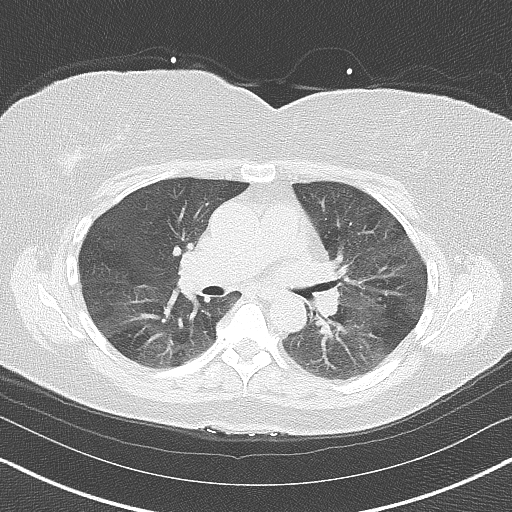

[Series 4: lung st 288 ms · axial · 0.68mm/px · z∈[-237,-153]mm · 5 of 44 slices shown]
[im 8/44  lung]
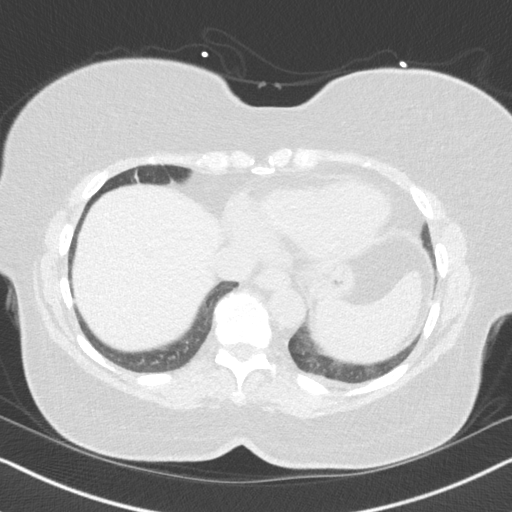
[im 15/44  lung]
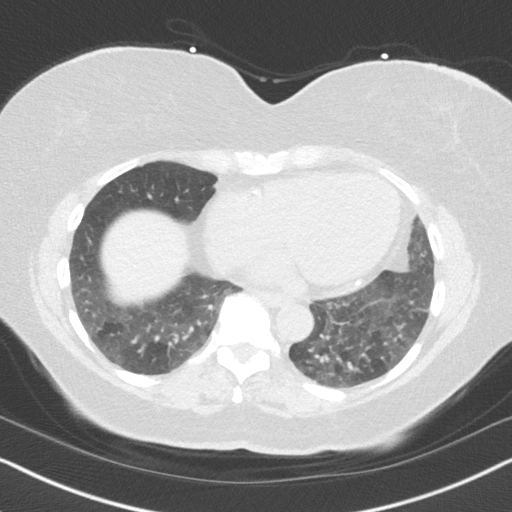
[im 22/44  lung]
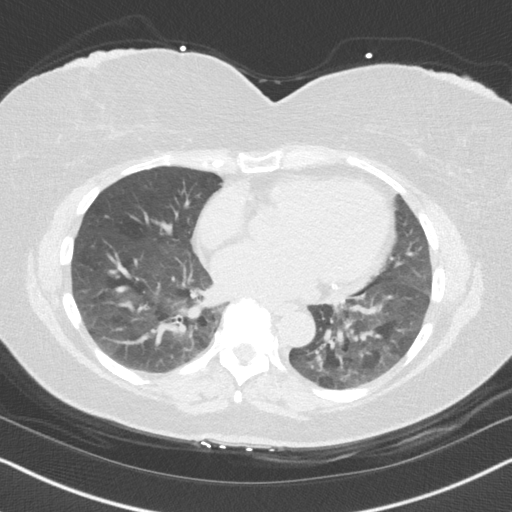
[im 29/44  lung]
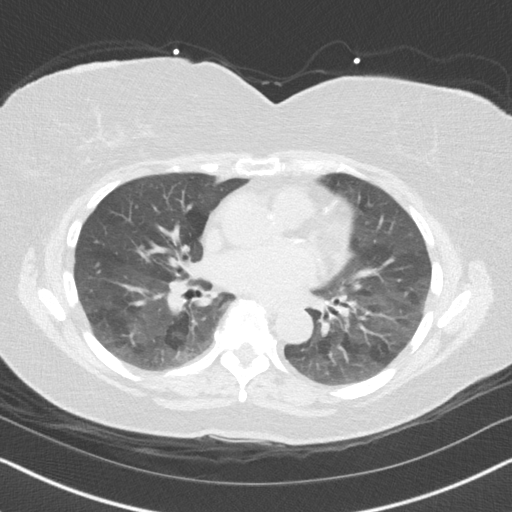
[im 36/44  lung]
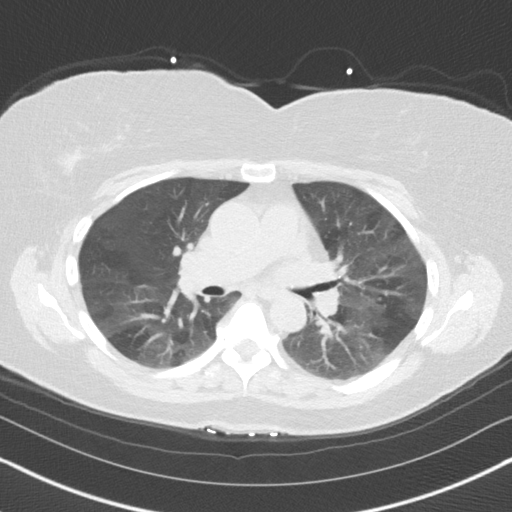

[14 of 20 positions shown; findings below may reference images not displayed]

FINDINGS: Aortic atherosclerosis. Mosaic attenuation throughout the lung
parenchyma with areas of lucency interspersed with areas of
ground-glass attenuation with intervening geographic margins,
concerning for widespread air trapping from small airways disease.
Within the visualized portions of the thorax there are no suspicious
appearing pulmonary nodules or masses, there is no acute
consolidative airspace disease, no pleural effusions, no
pneumothorax and no lymphadenopathy. Visualized portions of the
upper abdomen are unremarkable. There are no aggressive appearing
lytic or blastic lesions noted in the visualized portions of the
skeleton.
IMPRESSION: 1. Probable air trapping from small airways disease.
2. Aortic atherosclerosis.
FINDINGS: Coronary arteries: Normal origins.

Coronary Calcium Score:

Left main: 0

Left anterior descending artery:

Left circumflex artery: 451

Right coronary artery: 624

Total: 3359

Percentile: 98th

Pericardium: Normal.

Ascending Aorta: Normal caliber.  Scattered calcifications.

Non-cardiac: See separate report from [REDACTED].
IMPRESSION: Coronary calcium score of 3359. This was 98th percentile for age-,
race-, and sex-matched controls.



If CAC=0, it is reasonable to withhold statin therapy and reassess
in 5 to 10 years, as long as higher risk conditions are absent
(diabetes mellitus, family history of premature CHD in first degree
relatives (males <55 years; females <65 years), cigarette smoking,
or LDL >=190 mg/dL).

If CAC is 1 to 99, it is reasonable to initiate statin therapy for
patients >=55 years of age.

If CAC is >=100 or >=75th percentile, it is reasonable to initiate
statin therapy at any age.

Cardiology referral should be considered for patients with CAC
scores >=400 or >=75th percentile.

*3713 AHA/ACC/AACVPR/AAPA/ABC/GRUMIRA/CHIYOKO/JIM/[place]/ITZEL/NELBIN/PRIMA
Guideline on the Management of Blood Cholesterol: A Report of the
American College of Cardiology/American Heart Association Task Force
on Clinical Practice Guidelines. J Am Coll Cardiol.
4270;73(24):6187-6472.

*** End of Addendum ***
EXAM:
OVER-READ INTERPRETATION  CT CHEST

The following report is an over-read performed by radiologist Dr.
Blain Jumper [REDACTED] on 04/23/2021. This
over-read does not include interpretation of cardiac or coronary
anatomy or pathology. The coronary calcium score interpretation by
the cardiologist is attached.
FINDINGS: Aortic atherosclerosis. Mosaic attenuation throughout the lung
parenchyma with areas of lucency interspersed with areas of
ground-glass attenuation with intervening geographic margins,
concerning for widespread air trapping from small airways disease.
Within the visualized portions of the thorax there are no suspicious
appearing pulmonary nodules or masses, there is no acute
consolidative airspace disease, no pleural effusions, no
pneumothorax and no lymphadenopathy. Visualized portions of the
upper abdomen are unremarkable. There are no aggressive appearing
lytic or blastic lesions noted in the visualized portions of the
skeleton.
IMPRESSION: 1. Probable air trapping from small airways disease.
2. Aortic atherosclerosis.

## 2021-10-21 ENCOUNTER — Encounter: Payer: Self-pay | Admitting: Obstetrics and Gynecology

## 2021-10-28 DIAGNOSIS — F4323 Adjustment disorder with mixed anxiety and depressed mood: Secondary | ICD-10-CM | POA: Diagnosis not present

## 2021-11-11 DIAGNOSIS — F4323 Adjustment disorder with mixed anxiety and depressed mood: Secondary | ICD-10-CM | POA: Diagnosis not present

## 2021-11-21 ENCOUNTER — Other Ambulatory Visit: Payer: Self-pay | Admitting: Physician Assistant

## 2021-11-26 ENCOUNTER — Ambulatory Visit: Payer: PPO | Admitting: Obstetrics and Gynecology

## 2021-11-27 IMAGING — DX DG CHEST 2V
2 series · 2 of 2 positions shown · non-contrast
Comparison: May 05, 2021

CLINICAL DATA: Status post coronary artery bypass grafting

EXAM:
CHEST - 2 VIEW

[dg chest 2 view (1 of 2)]
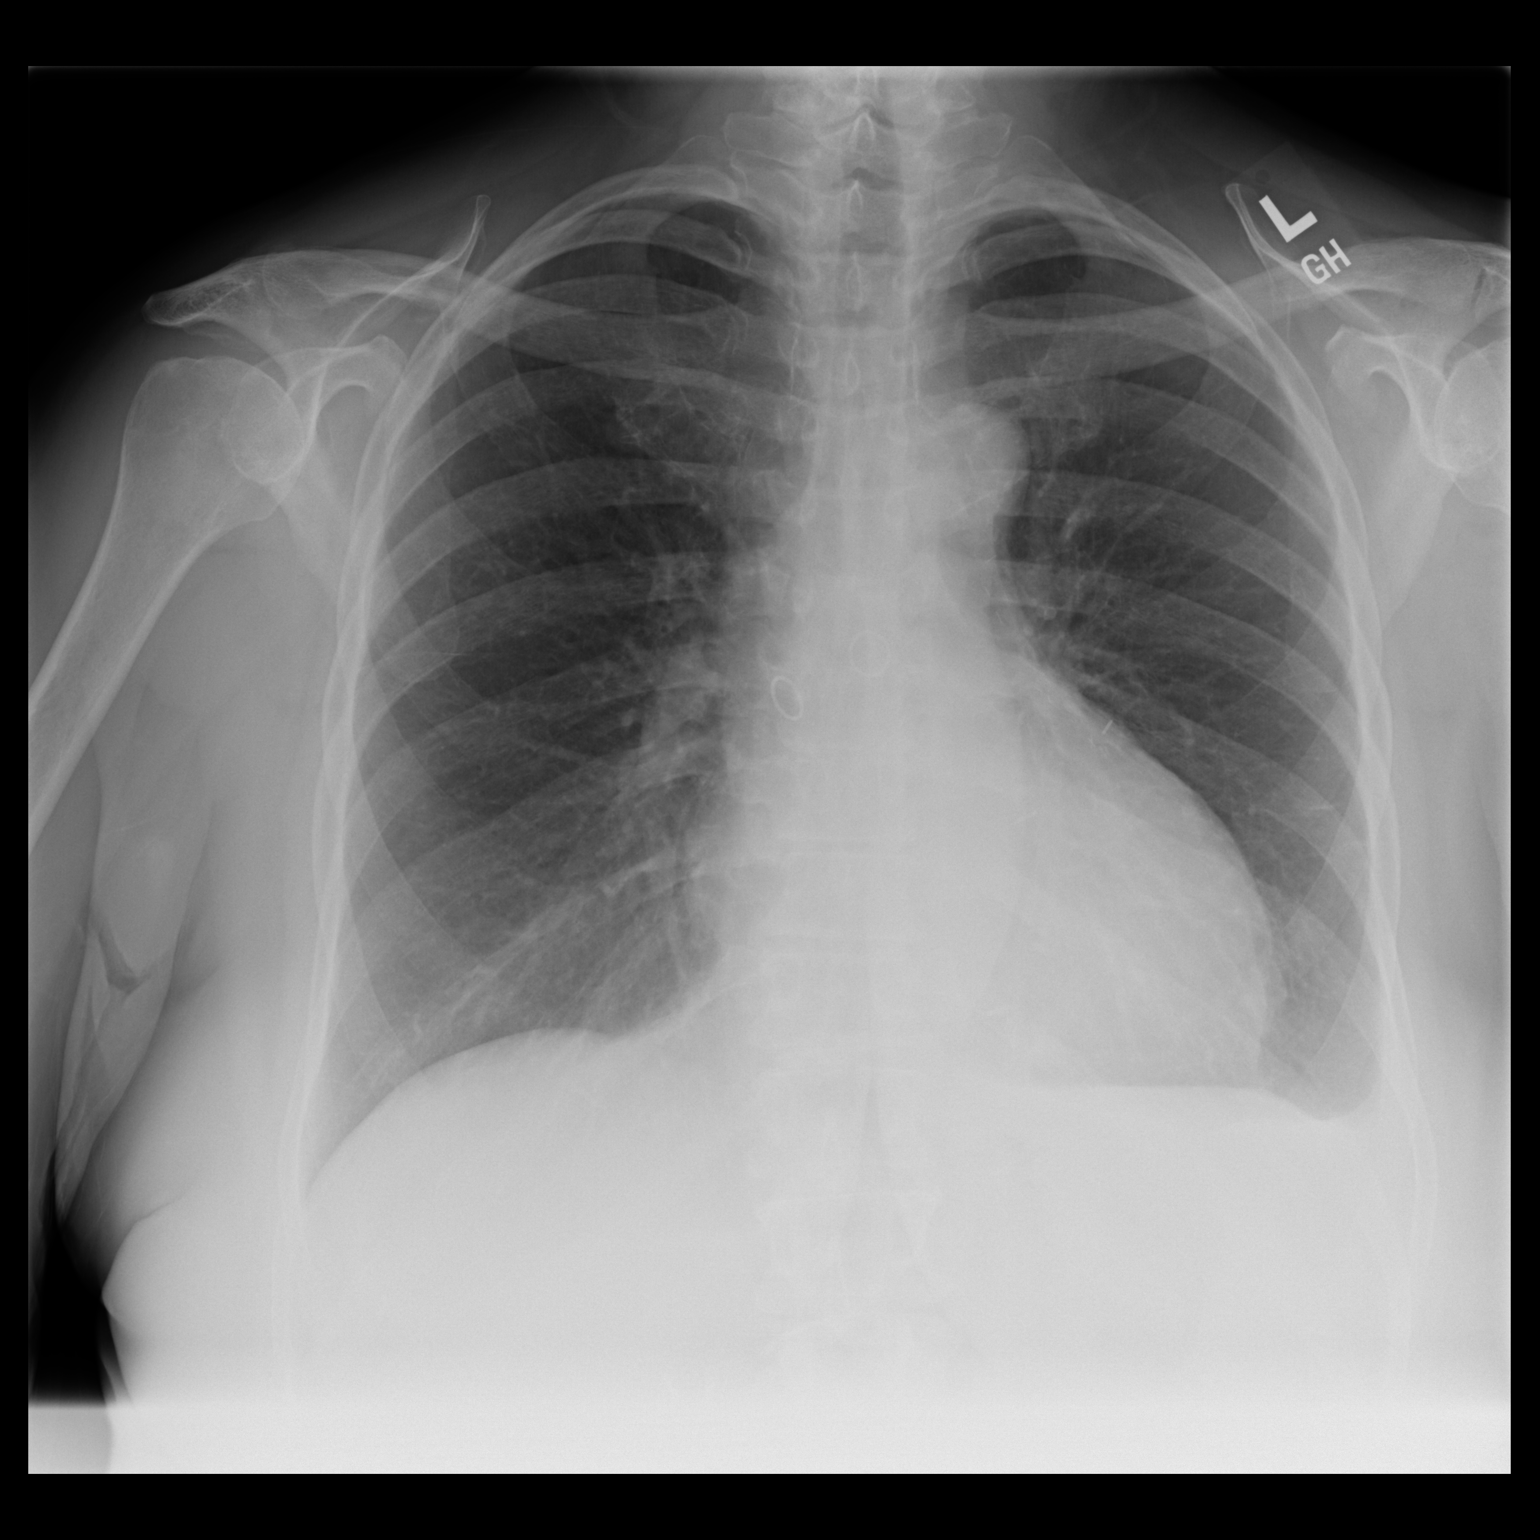

[dg chest 2 view (2 of 2)]
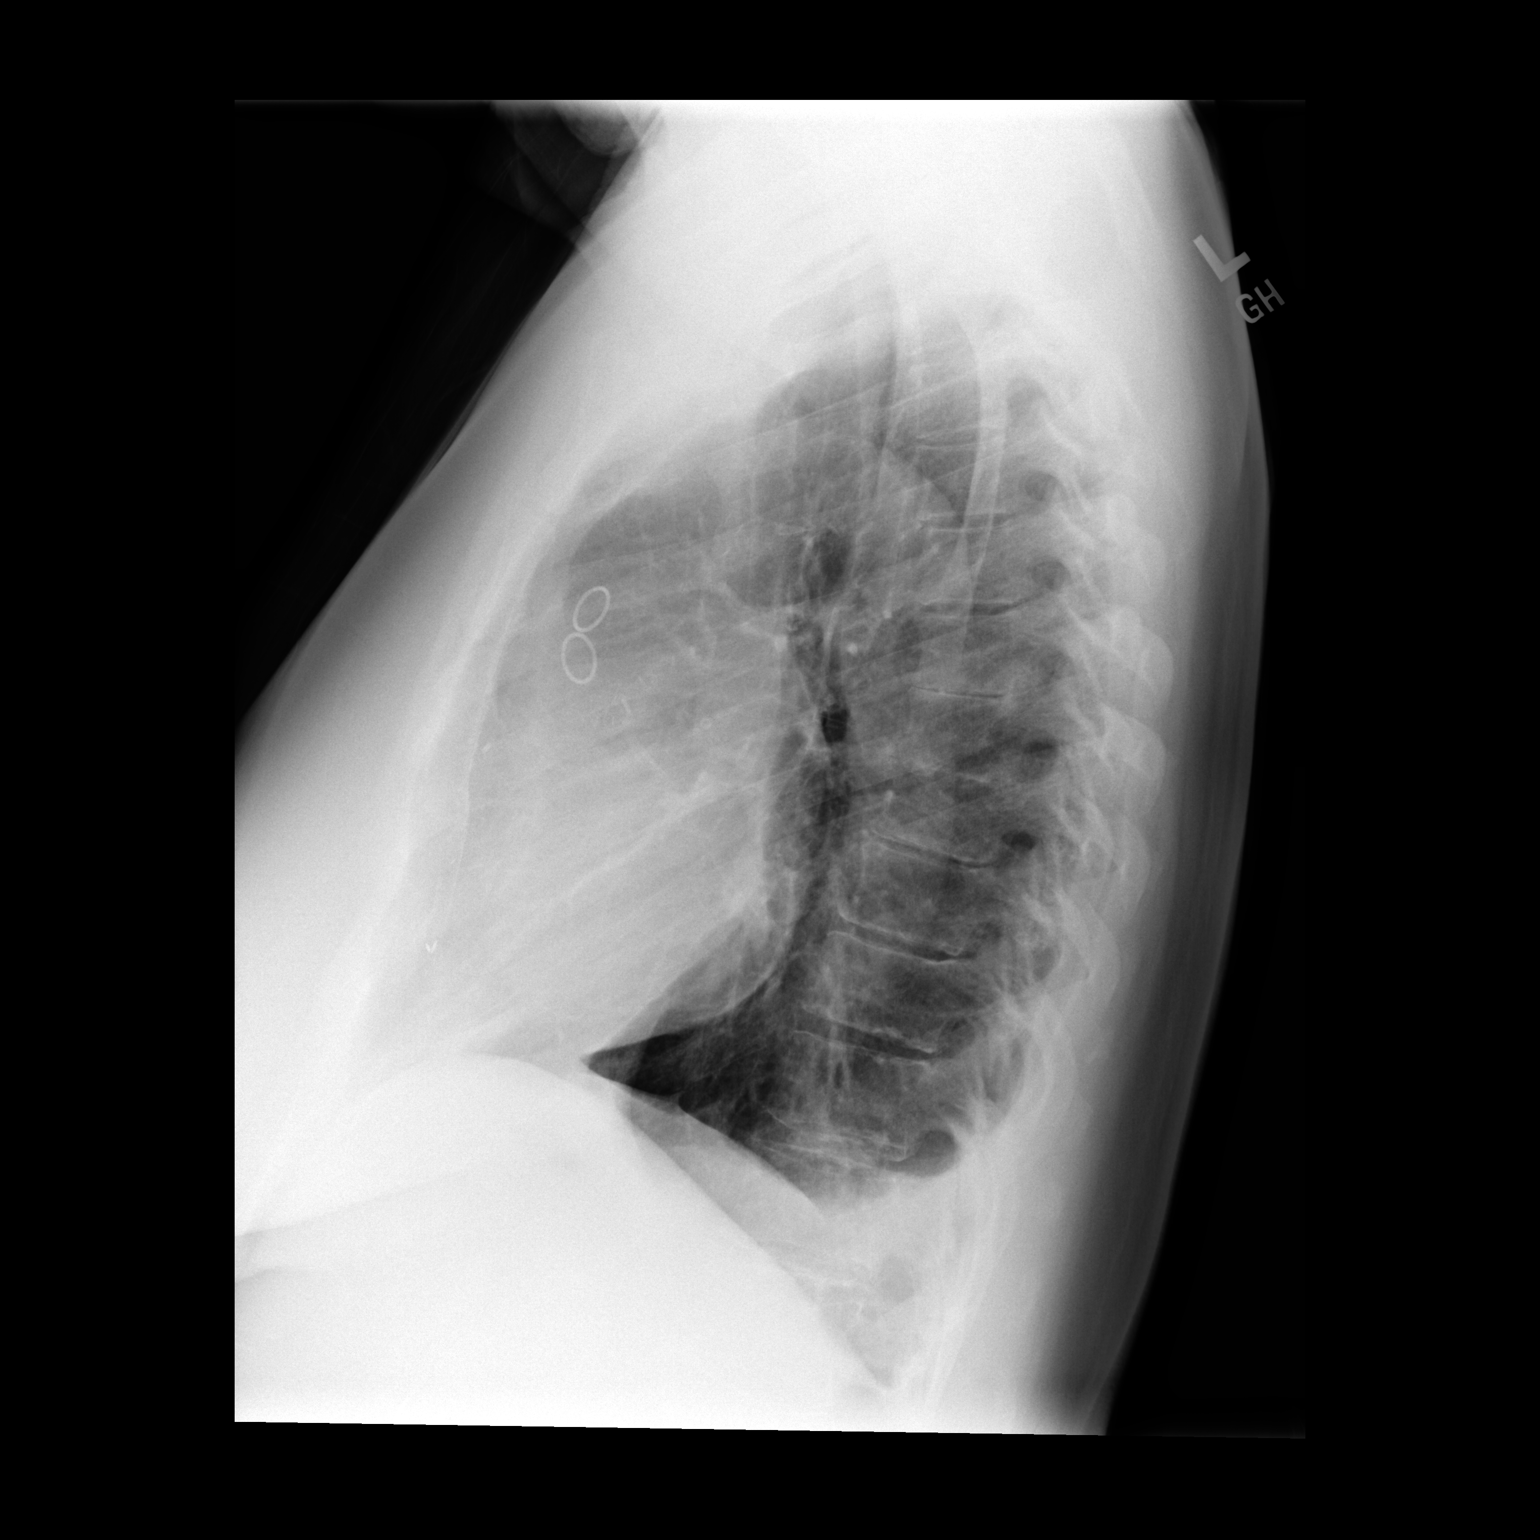

[2 of 2 positions shown; findings below may reference images not displayed]

FINDINGS: There is a small left pleural effusion. No edema or airspace
opacity. Heart is upper normal in size with pulmonary vascularity
normal. Patient is status post coronary artery bypass grafting. No
adenopathy. No pneumothorax. No bone lesions.
IMPRESSION: Small left pleural effusion. Lungs otherwise clear. Heart upper
normal in size with changes of coronary artery bypass grafting. No
adenopathy. No pneumothorax.

## 2021-11-28 ENCOUNTER — Ambulatory Visit (HOSPITAL_COMMUNITY): Payer: PPO | Attending: Cardiology

## 2021-11-28 ENCOUNTER — Other Ambulatory Visit: Payer: Self-pay

## 2021-11-28 DIAGNOSIS — I255 Ischemic cardiomyopathy: Secondary | ICD-10-CM | POA: Diagnosis not present

## 2021-11-28 LAB — ECHOCARDIOGRAM COMPLETE
Area-P 1/2: 3.85 cm2
S' Lateral: 2.8 cm

## 2021-11-28 MED ORDER — PERFLUTREN LIPID MICROSPHERE
1.0000 mL | INTRAVENOUS | Status: AC | PRN
Start: 2021-11-28 — End: 2021-11-28
  Administered 2021-11-28: 3 mL via INTRAVENOUS

## 2021-12-03 ENCOUNTER — Ambulatory Visit: Payer: PPO | Admitting: Cardiology

## 2021-12-05 NOTE — Progress Notes (Signed)
Cardiology Office Note   Date:  12/05/2021   ID:  Crystal Fox, DOB Apr 04, 1951, MRN [id number]  PCP:  Burnard Bunting, MD  Cardiologist: Dr. Stanford Breed  CC: Follow Up Echo, cardiomyopathy    History of Present Illness: Crystal Fox is a 70 y.o. female who presents for ongoing assessment and management of ischemic cardiomyopathy, most recent echocardiogram in April 2022 revealed an EF of 30% to 35% with akinesis of the mid distal anterior septal wall and apex, she did have grade 1 diastolic dysfunction, mild mitral and tricuspid regurgitation.  It was noted on cardiac catheterization May 2022 which revealed severe coronary artery disease, she did undergo preoperative carotid Doppler studies which were normal.  On May 02, 2021 the patient underwent CABG with LIMA to LAD, SVG to PDA, SVG to ramus intermediate.  She did have some postoperative atrial fibrillation treated with amiodarone.  She was also placed on Eliquis.  A repeat monitor in August 2022 revealed sinus rhythm with PACs, PAT, rare PVCs.  She did have a questionable episode of atrial fibrillation in September 2022 and therefore she was continued on Eliquis.  Last seen in the office by Dr. Stanford Breed on 09/19/2021.  At that time she was asymptomatic concerning chest discomfort or shortness of breath.  She was continued on her medical therapy.  However metoprolol was changed to Toprol 50 mg daily losartan 25 mg daily was added.  Consideration for transitioning to [facility] in the future if blood pressure was stable and allowable.  She was to have a repeat echocardiogram in December 2022 and be considered for ICD placement should her EF remain less than 35%.  Concerning PAF, she was continued on apixaban through December.  She does have a smart watch that was able to identify cardiac arrhythmias.  If she had no atrial fibrillation would discontinue on next office visit.  Echocardiogram dated 12 02/16/2021 revealed improvement in LV systolic  function as 50 to 55%.  There were no valvular abnormalities.  She comes today feeling well, wants to increase her workouts at the gym but wanted to make sure her echocardiogram results and follow-up would allow her to do this.  She is anxious about stopping Eliquis.  States that her husband died of a CVA and she is concerned about stopping it too soon.  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   LIMA to LAD, SVG to ramus intermediate, SVG to PDA, EVH via right thigh    Past Surgical History:  Procedure Laterality Date   bladder tack     BREAST SURGERY  1973   -benign Lt.breast bx   CHOLECYSTECTOMY OPEN  1985   CORONARY ARTERY BYPASS GRAFT N/A 05/02/2021   Procedure: CORONARY ARTERY BYPASS GRAFTING (CABG) X THREE , USING LEFT INTERNAL MAMMARY ARTERY AND RIGHT GREATER SAPHENOUS VEIN HARVESTED ENDOSCOPICALLY;  Surgeon: Rexene Alberts, MD;  Location: [place];  Service: Open Heart Surgery;  Laterality: N/A;   LEFT HEART CATH AND CORONARY ANGIOGRAPHY N/A 05/01/2021   Procedure: LEFT HEART CATH AND CORONARY ANGIOGRAPHY;  Surgeon: Burnell Blanks, MD;  Location: [place] [facility];  Service: Cardiovascular;  Laterality: N/A;   TEE WITHOUT CARDIOVERSION N/A 05/02/2021   Procedure: TRANSESOPHAGEAL ECHOCARDIOGRAM (  TEE);  Surgeon: Rexene Alberts, MD;  Location: [place];  Service: Open Heart Surgery;  Laterality: N/A;     Current Outpatient Medications  Medication Sig Dispense Refill   ACCU-CHEK AVIVA PLUS test strip 1 each by Other route every morning.     Accu-Chek FastClix Lancets MISC use to self monitor blood glucose four to six times per day     ALPRAZolam (XANAX) 0.25 MG tablet Take 0.25 mg by mouth every 6 (six) hours as needed for anxiety.     apixaban (ELIQUIS) 5 MG TABS tablet  Take 1 tablet (5 mg total) by mouth 2 (two) times daily. 60 tablet 3   Ascorbic Acid (VITAMIN C GUMMIES PO) Take 250 mg by mouth daily.     aspirin 81 MG chewable tablet Chew 81 mg by mouth daily.     atorvastatin (LIPITOR) 80 MG tablet Take 1 tablet (80 mg total) by mouth daily. (Patient taking differently: Take 80 mg by mouth at bedtime.) 90 tablet 3   BD PEN NEEDLE NANO U/F 32G X 4 MM MISC      Calcium Carb-Cholecalciferol (CALCIUM 500 + D3 PO) Take 1 tablet by mouth daily.     Coenzyme Q10 (COQ-10) 100 MG CAPS Take 100 mg by mouth every evening.     Estradiol (YUVAFEM) 10 MCG TABS vaginal tablet PLACE 1 TABLET (10 MCG TOTAL) VAGINALLY 2 (TWO) TIMES A WEEK. (Patient taking differently: Place 10 mcg vaginally 2 (two) times a week. Tuesdays & Fridays) 24 tablet 3   furosemide (LASIX) 20 MG tablet TAKE 1 TABLET BY MOUTH EVERY DAY 90 tablet 2   INVOKANA 300 MG TABS Take 300 mg by mouth daily before breakfast.     latanoprost (XALATAN) 0.005 % ophthalmic solution Place 1 drop into both eyes at bedtime.     loratadine (CLARITIN) 10 MG tablet Take 10 mg by mouth daily.     losartan (COZAAR) 25 MG tablet Take 1 tablet (25 mg total) by mouth daily. 90 tablet 3   metFORMIN (GLUCOPHAGE-XR) 500 MG 24 hr tablet Take 1,000 mg by mouth 2 (two) times daily with a meal.     metoprolol succinate (TOPROL XL) 25 MG 24 hr tablet Take 1 tablet (25 mg total) by mouth daily. 90 tablet 3   Multiple Vitamin (MULTIVITAMIN WITH MINERALS) TABS tablet Take 1 tablet by mouth daily at 2 PM. Multivitamin w/Omega-3     TRESIBA FLEXTOUCH 200 UNIT/ML SOPN Inject 22 Units into the skin at bedtime.  3   TRULICITY 1.5 ML/4.6TK SOPN Inject 1.5 mg into the skin every Wednesday.  3   Current Facility-Administered Medications  Medication Dose Route Frequency Provider Last Rate Last Admin   sodium chloride flush (NS) 0.9 % injection 3 mL  3 mL Intravenous Q12H Crenshaw, Denice Bors, MD        Allergies:   Sulfa antibiotics    Social  History:  The patient  reports that she has never smoked. She has never used smokeless tobacco. She reports that she does not drink alcohol and does not use drugs.   Family History:  The patient's family history includes Cancer in her paternal grandfather and paternal grandmother; Diabetes in her brother, mother, and sister; Heart attack in her father; Hypertension in her brother and mother; Stomach cancer in her mother.    ROS: All other systems are reviewed and negative. Unless otherwise mentioned in H&P    PHYSICAL EXAM: VS:  LMP 12/28/2004 (Approximate)  , BMI There  is no height or weight on file to calculate BMI. GEN: Well nourished, well developed, in no acute distress HEENT: normal Neck: no JVD, carotid bruits, or masses Cardiac: RRR; no murmurs, rubs, or gallops,no edema  Respiratory:  Clear to auscultation bilaterally, normal work of breathing GI: soft, nontender, nondistended, + BS MS: no deformity or atrophy Skin: warm and dry, no rash Neuro:  Strength and sensation are intact Psych: euthymic mood, full affect   EKG:  EKG is not ordered today.    Recent Labs: 05/07/2021: Magnesium 1.8 05/21/2021: Hemoglobin 10.7; Platelets 446 07/09/2021: ALT 31 10/03/2021: BUN 17; Creatinine, Ser 0.97; Potassium 4.9; Sodium 139    Lipid Panel    Component Value Date/Time   CHOL 143 07/09/2021 0858   TRIG 46 07/09/2021 0858   HDL 68 07/09/2021 0858   CHOLHDL 2.1 07/09/2021 0858   LDLCALC 65 07/09/2021 0858      Wt Readings from Last 3 Encounters:  09/19/21 174 lb 12.8 oz (79.3 kg)  09/05/21 170 lb 6.7 oz (77.3 kg)  07/10/21 176 lb 9.4 oz (80.1 kg)      Other studies Reviewed: Echocardiogram 12/26/2021 1. Left ventricular ejection fraction, by estimation, is 50 to 55%. The  left ventricle has normal function. The left ventricle demonstrates  regional wall motion abnormalities (see scoring diagram/findings for  description). Left ventricular diastolic  parameters are  indeterminate.   2. Right ventricular systolic function is mildly reduced. The right  ventricular size is normal. Tricuspid regurgitation signal is inadequate  for assessing PA pressure.   3. The mitral valve is normal in structure. Trivial mitral valve  regurgitation. No evidence of mitral stenosis.   4. The aortic valve is grossly normal. Aortic valve regurgitation is not  visualized. No aortic stenosis is present.   5. The inferior vena cava is normal in size with greater than 50%  respiratory variability, suggesting right atrial pressure of 3 mmHg.   ASSESSMENT AND PLAN:  1.  Ischemic cardiomyopathy: Repeat echocardiogram reveals significant improvement in LVEF to 50% to 55%.  She is pleased with this result.  She is okay to increase her time and weights at the gym in increments of 5 pounds a month to increase her stamina.  May use Lasix as needed.  2.  CAD: Status post CABG in May 2022.  She has recovered well did undergo cardiac rehab and continues to exercise on her own at the gym.  Continue management with blood pressure control, statin, with goal of LDL less than 70.  3.  Hypercholesterolemia: We will draw fasting lipids LFTs, BMET in AM.  Continue current medication regimen.  4.  Paroxysmal atrial fibrillation: The patient is reluctant to stop Eliquis although I have given her reassurance that it is unlikely that she is having any recurrence.  She states that her walks has not given her any indication that she has been in atrial fib nor does she feel any palpitations.  She is quite anxious however about stopping medication.  I will allow her to continue it for 3 more months but have adamantly recommended that she stop it the first day of March 2023.  5.  Type 2 diabetes: Continue metformin as directed followed by PCP.  Consider SGLT inhibitor for cardiac protection in the setting of CAD.   Current medicines are reviewed at length with the patient today.  I have spent 25 min's  dedicated to the care of this patient on the date of this encounter to include pre-visit  review of records, assessment, management and diagnostic testing,with shared decision making.  Labs/ tests ordered today include: Fasting lipids LFTs, CBC, BMET  Crystal Fox, ANP, AACC   12/05/2021 4:34 PM    [facility] [address] Office [phone] Fax [phone]  Notice: This dictation was prepared with Dragon dictation along with smaller phrase technology. Any transcriptional errors that result from this process are unintentional and may not be corrected upon review.

## 2021-12-08 ENCOUNTER — Other Ambulatory Visit: Payer: Self-pay

## 2021-12-08 ENCOUNTER — Ambulatory Visit: Payer: PPO | Admitting: Adult Health

## 2021-12-08 ENCOUNTER — Encounter: Payer: Self-pay | Admitting: Adult Health

## 2021-12-08 VITALS — BP 104/60 | HR 79 | Ht 64.0 in | Wt 176.0 lb

## 2021-12-08 DIAGNOSIS — E7800 Pure hypercholesterolemia, unspecified: Secondary | ICD-10-CM

## 2021-12-08 DIAGNOSIS — I480 Paroxysmal atrial fibrillation: Secondary | ICD-10-CM | POA: Diagnosis not present

## 2021-12-08 DIAGNOSIS — Z951 Presence of aortocoronary bypass graft: Secondary | ICD-10-CM

## 2021-12-08 DIAGNOSIS — I2510 Atherosclerotic heart disease of native coronary artery without angina pectoris: Secondary | ICD-10-CM | POA: Diagnosis not present

## 2021-12-08 NOTE — Patient Instructions (Signed)
Medication Instructions:  No Changes *If you need a refill on your cardiac medications before your next appointment, please call your pharmacy*   Lab Work: BMET ,CBC, Lipid Panel, Hepatic Panel, HgA1C. If you have labs (blood work) drawn today and your tests are completely normal, you will receive your results only by: [place] (if you have MyChart) OR A paper copy in the mail If you have any lab test that is abnormal or we need to change your treatment, we will call you to review the results.   Testing/Procedures: No Testing   Follow-Up: At [facility], you and your health needs are our priority.  As part of our continuing mission to provide you with exceptional heart care, we have created designated Provider Care Teams.  These Care Teams include your primary Cardiologist (physician) and Advanced Practice Providers (APPs -  Physician Assistants and Nurse Practitioners) who all work together to provide you with the care you need, when you need it.  We recommend signing up for the patient portal called "MyChart".  Sign up information is provided on this After Visit Summary.  MyChart is used to connect with patients for Virtual Visits (Telemedicine).  Patients are able to view lab/test results, encounter notes, upcoming appointments, etc.  Non-urgent messages can be sent to your provider as well.   To learn more about what you can do with MyChart, go to [email or web address].    Your next appointment:   6 month(s)  The format for your next appointment:   In Person  Provider:   Kirk Ruths, MD     Other Instructions Can Stop Eliquis in March 2023

## 2021-12-09 DIAGNOSIS — I2510 Atherosclerotic heart disease of native coronary artery without angina pectoris: Secondary | ICD-10-CM | POA: Diagnosis not present

## 2021-12-09 LAB — BASIC METABOLIC PANEL
BUN/Creatinine Ratio: 20 (ref 12–28)
BUN: 18 mg/dL (ref 8–27)
CO2: 23 mmol/L (ref 20–29)
Calcium: 9.9 mg/dL (ref 8.7–10.3)
Chloride: 98 mmol/L (ref 96–106)
Creatinine, Ser: 0.91 mg/dL (ref 0.57–1.00)
Glucose: 132 mg/dL — ABNORMAL HIGH (ref 70–99)
Potassium: 4.5 mmol/L (ref 3.5–5.2)
Sodium: 136 mmol/L (ref 134–144)
eGFR: 69 mL/min/{1.73_m2} (ref >59)

## 2021-12-09 LAB — LIPID PANEL
Chol/HDL Ratio: 2 ratio (ref 0.0–4.4)
Cholesterol, Total: 142 mg/dL (ref 100–199)
HDL: 70 mg/dL (ref >39)
LDL Chol Calc (NIH): 59 mg/dL (ref 0–99)
Triglycerides: 66 mg/dL (ref 0–149)
VLDL Cholesterol Cal: 13 mg/dL (ref 5–40)

## 2021-12-09 LAB — CBC
Hematocrit: 34.9 % (ref 34.0–46.6)
Hemoglobin: 11.2 g/dL (ref 11.1–15.9)
MCH: 26.1 pg — ABNORMAL LOW (ref 26.6–33.0)
MCHC: 32.1 g/dL (ref 31.5–35.7)
MCV: 81 fL (ref 79–97)
Platelets: 317 10*3/uL (ref 150–450)
RBC: 4.29 x10E6/uL (ref 3.77–5.28)
RDW: 15 % (ref 11.7–15.4)
WBC: 7.7 10*3/uL (ref 3.4–10.8)

## 2021-12-09 LAB — HEPATIC FUNCTION PANEL
ALT: 27 IU/L (ref 0–32)
AST: 26 IU/L (ref 0–40)
Albumin: 4.3 g/dL (ref 3.8–4.8)
Alkaline Phosphatase: 104 IU/L (ref 44–121)
Bilirubin Total: 0.3 mg/dL (ref 0.0–1.2)
Bilirubin, Direct: 0.12 mg/dL (ref 0.00–0.40)
Total Protein: 6.7 g/dL (ref 6.0–8.5)

## 2021-12-09 LAB — HEMOGLOBIN A1C
Est. average glucose Bld gHb Est-mCnc: 151 mg/dL
Hgb A1c MFr Bld: 6.9 % — ABNORMAL HIGH (ref 4.8–5.6)

## 2021-12-10 DIAGNOSIS — I10 Essential (primary) hypertension: Secondary | ICD-10-CM | POA: Diagnosis not present

## 2021-12-10 DIAGNOSIS — Z794 Long term (current) use of insulin: Secondary | ICD-10-CM | POA: Diagnosis not present

## 2021-12-10 DIAGNOSIS — I25810 Atherosclerosis of coronary artery bypass graft(s) without angina pectoris: Secondary | ICD-10-CM | POA: Diagnosis not present

## 2021-12-10 DIAGNOSIS — E1169 Type 2 diabetes mellitus with other specified complication: Secondary | ICD-10-CM | POA: Diagnosis not present

## 2021-12-11 ENCOUNTER — Other Ambulatory Visit: Payer: Self-pay | Admitting: Cardiology

## 2021-12-11 NOTE — Telephone Encounter (Signed)
Prescription refill request for Eliquis received. Indication:Afib Last office visit:12/12 Scr:0.9 Age: 70 Weight:79.8 kg  Prescription refilled

## 2021-12-12 ENCOUNTER — Telehealth: Payer: Self-pay

## 2021-12-12 NOTE — Telephone Encounter (Addendum)
Called patient regarding results.----- Message from Lendon Colonel, NP sent at 12/11/2021  1:38 PM EST ----- I have reviewed her Hgb A1c and Liver enzymes.  Liver enzymes are normal. The Hgb A1c is elevated at 6.9 slightly higher than 6 months ago. See PCP for her ongoing management of the diabetes.    Thank you,  KL

## 2021-12-23 NOTE — Progress Notes (Signed)
70 y.o. G69P1001 Widowed Caucasian female here for annual exam.    Patient is followed for prolapse and atrophy. She is using Vagifem and her cardiologist is aware of this.  No problems controlling her urine.  Normal bowel function.   She had a CABG and lost her husband soon after returning home from the hospital.  She is on Eliquis and will likely be coming off of this.   She is limited to lifting no more than 20 pounds.   PCP:   Burnard Bunting, MD  Patient's last menstrual period was 12/28/2004 (approximate).           Sexually active: No.  The current method of family planning is post menopausal status.    Exercising: Yes.     Gym Smoker:  no  Health Maintenance: Pap:  11-13-20 neg, 09-14-18 neg, 08-19-16 neg HPV HR neg History of abnormal Pap:  no MMG:  10-17-21 category b density birads 2:neg Colonoscopy:  2017 normal BMD:   n/a  Result  n/a.  Will do with PCP.  TDaP:  2016 Gardasil:   no HIV: neg 2017 Hep C: neg 2017 Screening Labs:  PCP and cardiology.    reports that she has never smoked. She has never used smokeless tobacco. She reports that she does not drink alcohol and does not use drugs.  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   LIMA to LAD, SVG to ramus intermediate, SVG to PDA, EVH via right thigh    Past Surgical History:  Procedure Laterality Date   bladder tack     BREAST SURGERY  1973   -benign Lt.breast bx   CHOLECYSTECTOMY OPEN  1985   CORONARY ARTERY BYPASS GRAFT N/A 05/02/2021   Procedure: CORONARY ARTERY BYPASS GRAFTING (CABG) X THREE , USING LEFT INTERNAL MAMMARY ARTERY AND RIGHT GREATER SAPHENOUS VEIN HARVESTED ENDOSCOPICALLY;  Surgeon: Rexene Alberts, MD;  Location: [place];  Service: Open Heart Surgery;  Laterality: N/A;   LEFT HEART  CATH AND CORONARY ANGIOGRAPHY N/A 05/01/2021   Procedure: LEFT HEART CATH AND CORONARY ANGIOGRAPHY;  Surgeon: Burnell Blanks, MD;  Location: [place] [facility];  Service: Cardiovascular;  Laterality: N/A;   TEE WITHOUT CARDIOVERSION N/A 05/02/2021   Procedure: TRANSESOPHAGEAL ECHOCARDIOGRAM (TEE);  Surgeon: Rexene Alberts, MD;  Location: [place];  Service: Open Heart Surgery;  Laterality: N/A;    Current Outpatient Medications  Medication Sig Dispense Refill   ACCU-CHEK AVIVA PLUS test strip 1 each by Other route every morning.     Accu-Chek FastClix Lancets MISC use to self monitor blood glucose four to six times per day     ALPRAZolam (XANAX) 0.25 MG tablet Take 0.25 mg by mouth every 6 (six) hours as needed for anxiety.     apixaban (ELIQUIS) 5 MG TABS tablet TAKE 1 TABLET BY MOUTH TWICE A DAY 60 tablet 5   Ascorbic Acid (VITAMIN C GUMMIES PO) Take 250 mg by mouth daily.     aspirin 81 MG chewable tablet Chew 81 mg by mouth daily.     atorvastatin (LIPITOR) 80 MG tablet Take 1 tablet (80 mg total) by mouth daily. 90 tablet 3   BD PEN NEEDLE NANO U/F 32G X  4 MM MISC      Calcium Carb-Cholecalciferol (CALCIUM 500 + D3 PO) Take 1 tablet by mouth daily.     Coenzyme Q10 (COQ-10) 100 MG CAPS Take 100 mg by mouth every evening.     furosemide (LASIX) 20 MG tablet TAKE 1 TABLET BY MOUTH EVERY DAY 90 tablet 2   INVOKANA 300 MG TABS Take 300 mg by mouth daily before breakfast.     latanoprost (XALATAN) 0.005 % ophthalmic solution Place 1 drop into both eyes at bedtime.     loratadine (CLARITIN) 10 MG tablet Take 10 mg by mouth daily.     losartan (COZAAR) 25 MG tablet Take 1 tablet (25 mg total) by mouth daily. 90 tablet 3   metFORMIN (GLUCOPHAGE-XR) 500 MG 24 hr tablet Take 1,000 mg by mouth 2 (two) times daily with a meal.     metoprolol succinate (TOPROL XL) 25 MG 24 hr tablet Take 1 tablet (25 mg total) by mouth daily. 90 tablet 3   Multiple Vitamin (MULTIVITAMIN WITH MINERALS) TABS  tablet Take 1 tablet by mouth daily at 2 PM. Multivitamin w/Omega-3     TRESIBA FLEXTOUCH 200 UNIT/ML SOPN Inject 22 Units into the skin at bedtime.  3   TRULICITY 1.5 JA/2.5KN SOPN Inject 1.5 mg into the skin every Wednesday.  3   Estradiol (YUVAFEM) 10 MCG TABS vaginal tablet Place 1 tablet (10 mcg total) vaginally 2 (two) times a week. Tuesdays & Fridays 24 tablet 3   Current Facility-Administered Medications  Medication Dose Route Frequency Provider Last Rate Last Admin   sodium chloride flush (NS) 0.9 % injection 3 mL  3 mL Intravenous Q12H Crenshaw, Denice Bors, MD        Family History  Problem Relation Age of Onset   Diabetes Mother    Hypertension Mother    Stomach cancer Mother    Heart attack Father        deceased from MI   Diabetes Sister    Diabetes Brother    Hypertension Brother    Cancer Paternal Grandmother        possible colon that went to liver   Cancer Paternal Grandfather        oral cancer    Review of Systems  Constitutional: Negative.   HENT: Negative.    Eyes: Negative.   Respiratory: Negative.    Cardiovascular: Negative.   Gastrointestinal: Negative.   Endocrine: Negative.   Genitourinary: Negative.   Musculoskeletal: Negative.   Skin: Negative.   Allergic/Immunologic: Negative.   Neurological: Negative.   Hematological: Negative.   Psychiatric/Behavioral: Negative.     Exam:   BP 104/70    Pulse 79    Resp 16    Ht 5\' 3"  (1.6 m)    Wt 178 lb (80.7 kg)    LMP 12/28/2004 (Approximate)    BMI 31.53 kg/m     General appearance: alert, cooperative and appears stated age Head: normocephalic, without obvious abnormality, atraumatic Neck: no adenopathy, supple, symmetrical, trachea midline and thyroid normal to inspection and palpation Lungs: clear to auscultation bilaterally Breasts: normal appearance, no masses or tenderness, No nipple retraction or dimpling, No nipple discharge or bleeding, No axillary adenopathy Heart: regular rate and  rhythm Abdomen: soft, non-tender; no masses, no organomegaly Extremities: extremities normal, atraumatic, no cyanosis or edema Skin: skin color, texture, turgor normal. No rashes or lesions Lymph nodes: cervical, supraclavicular, and axillary nodes normal. Neurologic: grossly normal  Pelvic: External genitalia:  no lesions  No abnormal inguinal nodes palpated.              Urethra:  normal appearing urethra with no masses, tenderness or lesions              Bartholins and Skenes: normal                 Vagina: normal appearing vagina with normal color and discharge, no lesions.  second degree cystocele, almost second degree uterine prolapse, mild rectocele.               Cervix: no lesions              Pap taken: no Bimanual Exam:  Uterus:  normal size, contour, position, consistency, mobility, non-tender              Adnexa: no mass, fullness, tenderness              Rectal exam: yes.  Confirms.              Anus:  normal sphincter tone, no lesions  Chaperone was present for exam:  Joy, CMA  Assessment:   Well woman visit with gynecologic exam. 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  Stable.  Vaginal atrophy. Status post CABG.  On Eliquis.  Bereavement.   Plan: Mammogram screening discussed. Self breast awareness reviewed. Pap and HR HPV not collected.  Guidelines for Calcium, Vitamin D, regular exercise program including cardiovascular and weight bearing exercise. Refill of vaginal estrogen.  I discussed potential effect on breast cancer and blood circulation.  She will discuss her estrogen prescription with her cardiologist if she comes off her Eliquis to see if she is able to continue the local vaginal estrogen. We reviewed signs and symptoms of progression of prolapse.  Reduced weight lifting can have positive effect on progression of prolapse.  She will pursue a BMD through her PCP.  Support  given for the loss of her husband.  Follow up annually and prn.   After visit summary provided.   33 min  total time was spent for this patient encounter, including preparation, face-to-face counseling with the patient, coordination of care, and documentation of the encounter.

## 2021-12-24 ENCOUNTER — Other Ambulatory Visit: Payer: Self-pay

## 2021-12-24 ENCOUNTER — Encounter: Payer: Self-pay | Admitting: Obstetrics and Gynecology

## 2021-12-24 ENCOUNTER — Ambulatory Visit (INDEPENDENT_AMBULATORY_CARE_PROVIDER_SITE_OTHER): Payer: PPO | Admitting: Obstetrics and Gynecology

## 2021-12-24 VITALS — BP 104/70 | HR 79 | Resp 16 | Ht 63.0 in | Wt 178.0 lb

## 2021-12-24 DIAGNOSIS — Z01419 Encounter for gynecological examination (general) (routine) without abnormal findings: Secondary | ICD-10-CM

## 2021-12-24 DIAGNOSIS — N812 Incomplete uterovaginal prolapse: Secondary | ICD-10-CM | POA: Diagnosis not present

## 2021-12-24 DIAGNOSIS — N952 Postmenopausal atrophic vaginitis: Secondary | ICD-10-CM

## 2021-12-24 MED ORDER — ESTRADIOL 10 MCG VA TABS: 10.0000 ug | ORAL_TABLET | VAGINAL | 3 refills | Status: DC

## 2021-12-24 NOTE — Patient Instructions (Signed)

## 2022-02-08 ENCOUNTER — Other Ambulatory Visit: Payer: Self-pay | Admitting: Cardiology

## 2022-02-09 ENCOUNTER — Telehealth: Payer: Self-pay | Admitting: Cardiology

## 2022-02-09 NOTE — Telephone Encounter (Signed)
Called pt, she states the highest her heart rate has gone with exertion is 103 bpm. Pt denies any other symptoms. Pt reassured normal heart rate is 60-100. Pt encouraged to monitor her heart rate and try not to obsess over it. She states she will monitor it ans asked when she should seek medical attention. She was instructed if her heart rate is sustained over 120 bpm she should seek medical attention. She verbalized understanding and thanked me for calling her. She states "I guess I just wanted someone to hold my hand"

## 2022-02-09 NOTE — Telephone Encounter (Signed)
STAT if HR is under 50 or over 120 (normal HR is 60-100 beats per minute)  What is your heart rate? 83  Do you have a log of your heart rate readings (document readings)? 103 after walking, resting hr is normal but on exertion hr is really fast   Do you have any other symptoms? No other symptoms... not sure if it is anxiety

## 2022-02-11 DIAGNOSIS — E663 Overweight: Secondary | ICD-10-CM | POA: Diagnosis not present

## 2022-02-11 DIAGNOSIS — Z794 Long term (current) use of insulin: Secondary | ICD-10-CM | POA: Diagnosis not present

## 2022-02-11 DIAGNOSIS — E1165 Type 2 diabetes mellitus with hyperglycemia: Secondary | ICD-10-CM | POA: Diagnosis not present

## 2022-02-11 DIAGNOSIS — I25810 Atherosclerosis of coronary artery bypass graft(s) without angina pectoris: Secondary | ICD-10-CM | POA: Diagnosis not present

## 2022-02-11 DIAGNOSIS — I10 Essential (primary) hypertension: Secondary | ICD-10-CM | POA: Diagnosis not present

## 2022-02-11 DIAGNOSIS — R002 Palpitations: Secondary | ICD-10-CM | POA: Diagnosis not present

## 2022-02-11 DIAGNOSIS — M1990 Unspecified osteoarthritis, unspecified site: Secondary | ICD-10-CM | POA: Diagnosis not present

## 2022-02-11 DIAGNOSIS — G629 Polyneuropathy, unspecified: Secondary | ICD-10-CM | POA: Diagnosis not present

## 2022-04-22 DIAGNOSIS — H04123 Dry eye syndrome of bilateral lacrimal glands: Secondary | ICD-10-CM | POA: Diagnosis not present

## 2022-04-22 DIAGNOSIS — H401131 Primary open-angle glaucoma, bilateral, mild stage: Secondary | ICD-10-CM | POA: Diagnosis not present

## 2022-05-12 DIAGNOSIS — I10 Essential (primary) hypertension: Secondary | ICD-10-CM | POA: Diagnosis not present

## 2022-05-12 DIAGNOSIS — E1165 Type 2 diabetes mellitus with hyperglycemia: Secondary | ICD-10-CM | POA: Diagnosis not present

## 2022-05-12 DIAGNOSIS — I25810 Atherosclerosis of coronary artery bypass graft(s) without angina pectoris: Secondary | ICD-10-CM | POA: Diagnosis not present

## 2022-05-12 DIAGNOSIS — Z794 Long term (current) use of insulin: Secondary | ICD-10-CM | POA: Diagnosis not present

## 2022-05-29 ENCOUNTER — Other Ambulatory Visit: Payer: Self-pay | Admitting: Family

## 2022-05-29 NOTE — Telephone Encounter (Signed)
Rx(s) sent to pharmacy electronically.  

## 2022-06-02 NOTE — Progress Notes (Signed)
HPI: Follow-up coronary artery disease and ischemic cardiomyopathy. Echocardiogram April 2022 showed ejection fraction 30 to 35%, akinesis of the mid distal anteroseptal wall and apex, grade 1 diastolic dysfunction, mild mitral and tricuspid regurgitation.  Cardiac catheterization May 2022 showed severe coronary disease.  Preoperative carotid Dopplers May 2022 showed near normal bilaterally.  Patient underwent coronary artery bypass graft on May 02, 2021 with LIMA to the LAD, saphenous vein graft to the PDA and saphenous vein graft to the ramus intermedius. Postoperative course complicated by atrial fibrillation.  This was treated with amiodarone.  Monitor August 2022 showed sinus rhythm with PACs, brief PAT, rare PVC and rare couplet.  Patient had a questionable episode of atrial fibrillation in early September based on her watch and Eliquis was reinitiated. Echocardiogram repeated December 2022 and showed ejection fraction 50 to 55%, mild RV dysfunction.  Since last seen she denies dyspnea, chest pain, palpitations or syncope.  Current Outpatient Medications  Medication Sig Dispense Refill   ACCU-CHEK AVIVA PLUS test strip 1 each by Other route every morning.     Accu-Chek FastClix Lancets MISC use to self monitor blood glucose four to six times per day     ALPRAZolam (XANAX) 0.25 MG tablet Take 0.25 mg by mouth every 6 (six) hours as needed for anxiety.     apixaban (ELIQUIS) 5 MG TABS tablet TAKE 1 TABLET BY MOUTH TWICE A DAY 60 tablet 5   Ascorbic Acid (VITAMIN C GUMMIES PO) Take 250 mg by mouth daily.     aspirin 81 MG chewable tablet Chew 81 mg by mouth daily.     atorvastatin (LIPITOR) 80 MG tablet TAKE 1 TABLET BY MOUTH EVERY DAY 90 tablet 3   BD PEN NEEDLE NANO U/F 32G X 4 MM MISC      Calcium Carb-Cholecalciferol (CALCIUM 500 + D3 PO) Take 1 tablet by mouth daily.     Coenzyme Q10 (COQ-10) 100 MG CAPS Take 100 mg by mouth every evening.     Estradiol (YUVAFEM) 10 MCG TABS vaginal  tablet Place 1 tablet (10 mcg total) vaginally 2 (two) times a week. Tuesdays & Fridays 24 tablet 3   furosemide (LASIX) 20 MG tablet TAKE 1 TABLET BY MOUTH EVERY DAY 90 tablet 1   INVOKANA 300 MG TABS Take 300 mg by mouth daily before breakfast.     latanoprost (XALATAN) 0.005 % ophthalmic solution Place 1 drop into both eyes at bedtime.     loratadine (CLARITIN) 10 MG tablet Take 10 mg by mouth daily.     losartan (COZAAR) 25 MG tablet Take 1 tablet (25 mg total) by mouth daily. 90 tablet 3   metFORMIN (GLUCOPHAGE-XR) 500 MG 24 hr tablet Take 1,000 mg by mouth 2 (two) times daily with a meal.     metoprolol succinate (TOPROL XL) 25 MG 24 hr tablet Take 1 tablet (25 mg total) by mouth daily. 90 tablet 3   Multiple Vitamin (MULTIVITAMIN WITH MINERALS) TABS tablet Take 1 tablet by mouth daily at 2 PM. Multivitamin w/Omega-3     TRESIBA FLEXTOUCH 200 UNIT/ML SOPN Inject 22 Units into the skin at bedtime.  3   TRULICITY 1.5 FB/5.1WC SOPN Inject 1.5 mg into the skin every Wednesday. 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   LIMA to LAD, SVG to ramus intermediate, SVG to PDA, EVH via right thigh    Past Surgical History:  Procedure Laterality Date   bladder tack     BREAST SURGERY  1973   -benign Lt.breast bx   CHOLECYSTECTOMY OPEN  1985   CORONARY ARTERY BYPASS GRAFT N/A 05/02/2021   Procedure: CORONARY ARTERY BYPASS GRAFTING (CABG) X THREE , USING LEFT INTERNAL MAMMARY ARTERY AND RIGHT GREATER SAPHENOUS VEIN HARVESTED  ENDOSCOPICALLY;  Surgeon: Rexene Alberts, MD;  Location: [facility];  Service: Open Heart Surgery;  Laterality: N/A;   LEFT HEART CATH AND CORONARY ANGIOGRAPHY N/A 05/01/2021   Procedure: LEFT HEART CATH AND CORONARY ANGIOGRAPHY;  Surgeon: Burnell Blanks, MD;  Location: [place] [facility];  Service: Cardiovascular;  Laterality: N/A;   TEE WITHOUT CARDIOVERSION N/A 05/02/2021   Procedure: TRANSESOPHAGEAL ECHOCARDIOGRAM (TEE);  Surgeon: Rexene Alberts, MD;  Location: [place] [facility];  Service: Open Heart Surgery;  Laterality: N/A;    Social History   Socioeconomic History   Marital status: Widowed    Spouse name: Not on file   Number of children: 5   Years of education: Not on file   Highest education level: Not on file  Occupational History   Not on file  Tobacco Use   Smoking status: Never   Smokeless tobacco: Never  Vaping Use   Vaping Use: Never used  Substance and Sexual Activity   Alcohol use: No   Drug use: No   Sexual activity: Not Currently    Partners: Male    Birth control/protection: Post-menopausal    Comment: husband vasectomy  Other Topics Concern   Not on file  Social History Narrative   Not on file   Social Determinants of Health   Financial Resource Strain: Not on file  Food Insecurity: Not on file  Transportation Needs: Not on file  Physical Activity: Not on file  Stress: Not on file  Social Connections: Not on file  Intimate Partner Violence: Not on file    Family History  Problem Relation Age of Onset   Diabetes Mother    Hypertension Mother    Stomach cancer Mother    Heart attack Father        deceased from MI   Diabetes Sister    Diabetes Brother    Hypertension Brother    Cancer Paternal Grandmother        possible colon that went to liver   Cancer Paternal Grandfather        oral cancer    ROS: no fevers or chills, productive cough, hemoptysis, dysphasia, odynophagia, melena, hematochezia, dysuria, hematuria, rash, seizure activity,  orthopnea, PND, pedal edema, claudication. Remaining systems are negative.  Physical Exam: Well-developed well-nourished in no acute distress.  Skin is warm and dry.  HEENT is normal.  Neck is supple.  Chest is clear to auscultation with normal expansion.  Cardiovascular exam is regular rate and rhythm.  Abdominal exam nontender or distended. No masses palpated. Extremities show no edema. neuro grossly intact  ECG-normal sinus rhythm at a rate of 76, normal axis, septal infarct.  Personally reviewed  A/P  1 coronary artery disease  status post coronary artery bypass graft-patient denies recurrent chest pain.  Continue aspirin and statin.  2 paroxysmal atrial fibrillation-patient did have postoperative atrial fibrillation.  However no documented recurrences.  Continue Toprol.  I will discontinue apixaban.  She will record rhythm strips with her smart watch and we will review for any recurrences though I think this was previously postoperative atrial fibrillation.  3 ischemic cardiomyopathy-continue losartan and Toprol.  LV function has improved.  4 hypertension-blood pressure controlled.  Continue present medical regimen.  5 hyperlipidemia-continue statin.  Kirk Ruths, MD

## 2022-06-05 ENCOUNTER — Encounter: Payer: Self-pay | Admitting: Cardiology

## 2022-06-05 ENCOUNTER — Ambulatory Visit: Payer: PPO | Admitting: Cardiology

## 2022-06-05 VITALS — BP 120/73 | HR 76 | Ht 64.0 in | Wt 171.2 lb

## 2022-06-05 DIAGNOSIS — I2510 Atherosclerotic heart disease of native coronary artery without angina pectoris: Secondary | ICD-10-CM

## 2022-06-05 DIAGNOSIS — E7800 Pure hypercholesterolemia, unspecified: Secondary | ICD-10-CM | POA: Diagnosis not present

## 2022-06-05 DIAGNOSIS — I255 Ischemic cardiomyopathy: Secondary | ICD-10-CM

## 2022-06-05 DIAGNOSIS — I480 Paroxysmal atrial fibrillation: Secondary | ICD-10-CM

## 2022-06-05 DIAGNOSIS — I10 Essential (primary) hypertension: Secondary | ICD-10-CM

## 2022-06-05 NOTE — Patient Instructions (Signed)
Medication Instructions:   STOP ELIQUIS  *If you need a refill on your cardiac medications before your next appointment, please call your pharmacy*   Follow-Up: At [facility], you and your health needs are our priority.  As part of our continuing mission to provide you with exceptional heart care, we have created designated Provider Care Teams.  These Care Teams include your primary Cardiologist (physician) and Advanced Practice Providers (APPs -  Physician Assistants and Nurse Practitioners) who all work together to provide you with the care you need, when you need it.  We recommend signing up for the patient portal called "MyChart".  Sign up information is provided on this After Visit Summary.  MyChart is used to connect with patients for Virtual Visits (Telemedicine).  Patients are able to view lab/test results, encounter notes, upcoming appointments, etc.  Non-urgent messages can be sent to your provider as well.   To learn more about what you can do with MyChart, go to [email or web address].    Your next appointment:   6 month(s)  The format for your next appointment:   In Person  Provider:   Kirk Ruths, MD       Important Information About Sugar

## 2022-08-17 DIAGNOSIS — I10 Essential (primary) hypertension: Secondary | ICD-10-CM | POA: Diagnosis not present

## 2022-08-17 DIAGNOSIS — Z0000 Encounter for general adult medical examination without abnormal findings: Secondary | ICD-10-CM | POA: Diagnosis not present

## 2022-08-17 DIAGNOSIS — E1169 Type 2 diabetes mellitus with other specified complication: Secondary | ICD-10-CM | POA: Diagnosis not present

## 2022-08-17 DIAGNOSIS — R7989 Other specified abnormal findings of blood chemistry: Secondary | ICD-10-CM | POA: Diagnosis not present

## 2022-08-17 DIAGNOSIS — E785 Hyperlipidemia, unspecified: Secondary | ICD-10-CM | POA: Diagnosis not present

## 2022-08-19 DIAGNOSIS — Z794 Long term (current) use of insulin: Secondary | ICD-10-CM | POA: Diagnosis not present

## 2022-08-19 DIAGNOSIS — R82998 Other abnormal findings in urine: Secondary | ICD-10-CM | POA: Diagnosis not present

## 2022-08-19 DIAGNOSIS — I10 Essential (primary) hypertension: Secondary | ICD-10-CM | POA: Diagnosis not present

## 2022-08-19 DIAGNOSIS — G629 Polyneuropathy, unspecified: Secondary | ICD-10-CM | POA: Diagnosis not present

## 2022-08-19 DIAGNOSIS — Z0000 Encounter for general adult medical examination without abnormal findings: Secondary | ICD-10-CM | POA: Diagnosis not present

## 2022-08-19 DIAGNOSIS — E1169 Type 2 diabetes mellitus with other specified complication: Secondary | ICD-10-CM | POA: Diagnosis not present

## 2022-08-19 DIAGNOSIS — I25810 Atherosclerosis of coronary artery bypass graft(s) without angina pectoris: Secondary | ICD-10-CM | POA: Diagnosis not present

## 2022-08-19 DIAGNOSIS — Z1339 Encounter for screening examination for other mental health and behavioral disorders: Secondary | ICD-10-CM | POA: Diagnosis not present

## 2022-08-19 DIAGNOSIS — E113293 Type 2 diabetes mellitus with mild nonproliferative diabetic retinopathy without macular edema, bilateral: Secondary | ICD-10-CM | POA: Diagnosis not present

## 2022-08-19 DIAGNOSIS — E119 Type 2 diabetes mellitus without complications: Secondary | ICD-10-CM | POA: Diagnosis not present

## 2022-08-19 DIAGNOSIS — E785 Hyperlipidemia, unspecified: Secondary | ICD-10-CM | POA: Diagnosis not present

## 2022-08-19 DIAGNOSIS — Z1331 Encounter for screening for depression: Secondary | ICD-10-CM | POA: Diagnosis not present

## 2022-08-26 ENCOUNTER — Other Ambulatory Visit: Payer: Self-pay | Admitting: Cardiology

## 2022-08-26 DIAGNOSIS — I255 Ischemic cardiomyopathy: Secondary | ICD-10-CM

## 2022-10-08 ENCOUNTER — Other Ambulatory Visit: Payer: Self-pay | Admitting: Cardiology

## 2022-10-08 DIAGNOSIS — I255 Ischemic cardiomyopathy: Secondary | ICD-10-CM

## 2022-10-09 ENCOUNTER — Other Ambulatory Visit: Payer: Self-pay

## 2022-10-09 DIAGNOSIS — I255 Ischemic cardiomyopathy: Secondary | ICD-10-CM

## 2022-10-09 MED ORDER — METOPROLOL SUCCINATE ER 25 MG PO TB24: 25.0000 mg | ORAL_TABLET | Freq: Every day | ORAL | 0 refills | Status: DC

## 2022-10-10 DIAGNOSIS — Z23 Encounter for immunization: Secondary | ICD-10-CM | POA: Diagnosis not present

## 2022-10-21 DIAGNOSIS — H52203 Unspecified astigmatism, bilateral: Secondary | ICD-10-CM | POA: Diagnosis not present

## 2022-10-21 DIAGNOSIS — H524 Presbyopia: Secondary | ICD-10-CM | POA: Diagnosis not present

## 2022-10-21 DIAGNOSIS — H401131 Primary open-angle glaucoma, bilateral, mild stage: Secondary | ICD-10-CM | POA: Diagnosis not present

## 2022-10-21 DIAGNOSIS — H2513 Age-related nuclear cataract, bilateral: Secondary | ICD-10-CM | POA: Diagnosis not present

## 2022-11-06 DIAGNOSIS — Z1231 Encounter for screening mammogram for malignant neoplasm of breast: Secondary | ICD-10-CM | POA: Diagnosis not present

## 2022-11-09 ENCOUNTER — Encounter: Payer: Self-pay | Admitting: Obstetrics and Gynecology

## 2022-11-15 ENCOUNTER — Other Ambulatory Visit: Payer: Self-pay | Admitting: Cardiology

## 2022-11-24 NOTE — Progress Notes (Signed)
HPI: Follow-up coronary artery disease and ischemic cardiomyopathy. Echocardiogram April 2022 showed ejection fraction 30 to 35%, akinesis of the mid distal anteroseptal wall and apex, grade 1 diastolic dysfunction, mild mitral and tricuspid regurgitation.  Cardiac catheterization May 2022 showed severe coronary disease.  Preoperative carotid Dopplers May 2022 showed near normal bilaterally.  Patient underwent coronary artery bypass graft on May 02, 2021 with LIMA to the LAD, saphenous vein graft to the PDA and saphenous vein graft to the ramus intermedius. Postoperative course complicated by atrial fibrillation.  This was treated with amiodarone.  Monitor August 2022 showed sinus rhythm with PACs, brief PAT, rare PVC and rare couplet.  Patient had a questionable episode of atrial fibrillation in early September based on her watch and Eliquis was reinitiated. Echocardiogram repeated December 2022 and showed ejection fraction 50 to 55%, mild RV dysfunction.  Since last seen she denies dyspnea, chest pain, palpitations or syncope.  Current Outpatient Medications  Medication Sig Dispense Refill   ACCU-CHEK AVIVA PLUS test strip 1 each by Other route every morning.     Accu-Chek FastClix Lancets MISC use to self monitor blood glucose four to six times per day     ALPRAZolam (XANAX) 0.25 MG tablet Take 0.25 mg by mouth every 6 (six) hours as needed for anxiety.     Ascorbic Acid (VITAMIN C GUMMIES PO) Take 250 mg by mouth daily.     aspirin 81 MG chewable tablet Chew 81 mg by mouth daily.     atorvastatin (LIPITOR) 80 MG tablet TAKE 1 TABLET BY MOUTH EVERY DAY 90 tablet 3   BD PEN NEEDLE NANO U/F 32G X 4 MM MISC      Calcium Carb-Cholecalciferol (CALCIUM 500 + D3 PO) Take 1 tablet by mouth daily.     Coenzyme Q10 (COQ-10) 100 MG CAPS Take 100 mg by mouth every evening.     Estradiol (YUVAFEM) 10 MCG TABS vaginal tablet Place 1 tablet (10 mcg total) vaginally 2 (two) times a week. Tuesdays & Fridays 24  tablet 3   furosemide (LASIX) 20 MG tablet TAKE 1 TABLET BY MOUTH EVERY DAY 90 tablet 3   INVOKANA 300 MG TABS Take 300 mg by mouth daily before breakfast.     latanoprost (XALATAN) 0.005 % ophthalmic solution Place 1 drop into both eyes at bedtime.     loratadine (CLARITIN) 10 MG tablet Take 10 mg by mouth daily.     metFORMIN (GLUCOPHAGE-XR) 500 MG 24 hr tablet Take 1,000 mg by mouth 2 (two) times daily with a meal.     metoprolol succinate (TOPROL XL) 25 MG 24 hr tablet Take 1 tablet (25 mg total) by mouth daily. Please keep scheduled appointment for additional refilld. 90 tablet 0   Multiple Vitamin (MULTIVITAMIN WITH MINERALS) TABS tablet Take 1 tablet by mouth daily at 2 PM. Multivitamin w/Omega-3     Multiple Vitamins-Minerals (PRESERVISION AREDS 2) CAPS 1 capsule Orally daily     TRESIBA FLEXTOUCH 200 UNIT/ML SOPN Inject 22 Units into the skin at bedtime.  3   TRULICITY 1.5 CB/7.6EG SOPN Inject 1.5 mg into the skin every Wednesday.  3   losartan (COZAAR) 25 MG tablet TAKE 1 TABLET (25 MG TOTAL) BY MOUTH DAILY. 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   LIMA to LAD, SVG to ramus intermediate, SVG to PDA, EVH via right thigh    Past Surgical History:  Procedure Laterality Date   bladder tack     BREAST SURGERY  1973   -benign Lt.breast bx   CHOLECYSTECTOMY OPEN  1985   CORONARY ARTERY BYPASS GRAFT N/A 05/02/2021   Procedure: CORONARY ARTERY BYPASS GRAFTING (CABG) X THREE , USING LEFT INTERNAL MAMMARY ARTERY AND RIGHT GREATER SAPHENOUS VEIN HARVESTED  ENDOSCOPICALLY;  Surgeon: Rexene Alberts, MD;  Location: [facility];  Service: Open Heart Surgery;  Laterality: N/A;   LEFT HEART CATH AND CORONARY ANGIOGRAPHY N/A 05/01/2021   Procedure: LEFT HEART CATH AND CORONARY ANGIOGRAPHY;  Surgeon: Burnell Blanks, MD;  Location: [place] [facility];  Service: Cardiovascular;  Laterality: N/A;   TEE WITHOUT CARDIOVERSION N/A 05/02/2021   Procedure: TRANSESOPHAGEAL ECHOCARDIOGRAM (TEE);  Surgeon: Rexene Alberts, MD;  Location: [place];  Service: Open Heart Surgery;  Laterality: N/A;    Social History   Socioeconomic History   Marital status: Widowed    Spouse name: Not on file   Number of children: 5   Years of education: Not on file   Highest education level: Not on file  Occupational History   Not on file  Tobacco Use   Smoking status: Never   Smokeless tobacco: Never  Vaping Use   Vaping Use: Never used  Substance and Sexual Activity   Alcohol use: No   Drug use: No   Sexual activity: Not Currently    Partners: Male    Birth control/protection: Post-menopausal    Comment: husband vasectomy  Other Topics Concern   Not on file  Social History Narrative   Not on file   Social Determinants of Health   Financial Resource Strain: Not on file  Food Insecurity: Not on file  Transportation Needs: Not on file  Physical Activity: Not on file  Stress: Not on file  Social Connections: Not on file  Intimate Partner Violence: Not on file    Family History  Problem Relation Age of Onset   Diabetes Mother    Hypertension Mother    Stomach cancer Mother    Heart attack Father        deceased from MI   Diabetes Sister    Diabetes Brother    Hypertension Brother    Cancer Paternal Grandmother        possible colon that went to liver   Cancer Paternal Grandfather        oral cancer    ROS: no fevers or chills, productive cough, hemoptysis, dysphasia, odynophagia, melena, hematochezia, dysuria, hematuria, rash, seizure activity,  orthopnea, PND, pedal edema, claudication. Remaining systems are negative.  Physical Exam: Well-developed well-nourished in no acute distress.  Skin is warm and dry.  HEENT is normal.  Neck is supple.  Chest is clear to auscultation with normal expansion.  Cardiovascular exam is regular rate and rhythm.  Abdominal exam nontender or distended. No masses palpated. Extremities show no edema. neuro grossly intact  A/P  1 CAD-patient doing well with no chest pain.  Continue medical therapy with aspirin and statin.  2 paroxysmal atrial  fibrillation-patient had postoperative atrial fibrillation but has had no documented recurrences.  Continue Toprol.  3 hypertension-patient's blood pressure is controlled.  Continue present medications and follow-up.  Check potassium and renal function.  4 history of ischemic cardiomyopathy-LV function has improved on most recent echocardiogram.  Continue losartan and Toprol.  5 hyperlipidemia-continue statin.  Check lipids and liver.  Kirk Ruths, MD

## 2022-11-25 DIAGNOSIS — Z794 Long term (current) use of insulin: Secondary | ICD-10-CM | POA: Diagnosis not present

## 2022-11-25 DIAGNOSIS — I10 Essential (primary) hypertension: Secondary | ICD-10-CM | POA: Diagnosis not present

## 2022-11-25 DIAGNOSIS — E1169 Type 2 diabetes mellitus with other specified complication: Secondary | ICD-10-CM | POA: Diagnosis not present

## 2022-11-25 DIAGNOSIS — I25810 Atherosclerosis of coronary artery bypass graft(s) without angina pectoris: Secondary | ICD-10-CM | POA: Diagnosis not present

## 2022-12-04 ENCOUNTER — Encounter: Payer: Self-pay | Admitting: Cardiology

## 2022-12-04 ENCOUNTER — Ambulatory Visit: Payer: PPO | Attending: Cardiology | Admitting: Cardiology

## 2022-12-04 VITALS — BP 134/62 | HR 89 | Ht 63.5 in | Wt 173.4 lb

## 2022-12-04 DIAGNOSIS — I2510 Atherosclerotic heart disease of native coronary artery without angina pectoris: Secondary | ICD-10-CM | POA: Diagnosis not present

## 2022-12-04 DIAGNOSIS — I255 Ischemic cardiomyopathy: Secondary | ICD-10-CM

## 2022-12-04 DIAGNOSIS — E7800 Pure hypercholesterolemia, unspecified: Secondary | ICD-10-CM | POA: Diagnosis not present

## 2022-12-04 DIAGNOSIS — I480 Paroxysmal atrial fibrillation: Secondary | ICD-10-CM

## 2022-12-04 DIAGNOSIS — I10 Essential (primary) hypertension: Secondary | ICD-10-CM

## 2022-12-04 LAB — BASIC METABOLIC PANEL
BUN/Creatinine Ratio: 19 (ref 12–28)
BUN: 18 mg/dL (ref 8–27)
CO2: 23 mmol/L (ref 20–29)
Calcium: 10.5 mg/dL — ABNORMAL HIGH (ref 8.7–10.3)
Chloride: 98 mmol/L (ref 96–106)
Creatinine, Ser: 0.96 mg/dL (ref 0.57–1.00)
Glucose: 170 mg/dL — ABNORMAL HIGH (ref 70–99)
Potassium: 4.7 mmol/L (ref 3.5–5.2)
Sodium: 137 mmol/L (ref 134–144)
eGFR: 64 mL/min/{1.73_m2} (ref >59)

## 2022-12-04 LAB — LIPID PANEL
Chol/HDL Ratio: 2.2 ratio (ref 0.0–4.4)
Cholesterol, Total: 144 mg/dL (ref 100–199)
HDL: 65 mg/dL (ref >39)
LDL Chol Calc (NIH): 65 mg/dL (ref 0–99)
Triglycerides: 69 mg/dL (ref 0–149)
VLDL Cholesterol Cal: 14 mg/dL (ref 5–40)

## 2022-12-04 LAB — HEPATIC FUNCTION PANEL
ALT: 28 IU/L (ref 0–32)
AST: 22 IU/L (ref 0–40)
Albumin: 4.5 g/dL (ref 3.9–4.9)
Alkaline Phosphatase: 113 IU/L (ref 44–121)
Bilirubin Total: 0.5 mg/dL (ref 0.0–1.2)
Bilirubin, Direct: 0.17 mg/dL (ref 0.00–0.40)
Total Protein: 7 g/dL (ref 6.0–8.5)

## 2022-12-04 NOTE — Patient Instructions (Signed)
Medication Instructions:  No Changes In Medications at this time.  *If you need a refill on your cardiac medications before your next appointment, please call your pharmacy*   Lab Work: BLOOD WORK TODAY  If you have labs (blood work) drawn today and your tests are completely normal, you will receive your results only by: [place] (if you have MyChart) OR A paper copy in the mail If you have any lab test that is abnormal or we need to change your treatment, we will call you to review the results.  Testing/Procedures: None Ordered At This Time.   Follow-Up: At [facility], you and your health needs are our priority.  As part of our continuing mission to provide you with exceptional heart care, we have created designated Provider Care Teams.  These Care Teams include your primary Cardiologist (physician) and Advanced Practice Providers (APPs -  Physician Assistants and Nurse Practitioners) who all work together to provide you with the care you need, when you need it.  Your next appointment:   1 year(s)  The format for your next appointment:   In Person  Provider:   Kirk Ruths, MD

## 2022-12-17 NOTE — Progress Notes (Signed)
71 y.o. G18P1001 Widowed Caucasian female here for annual exam.    Patient is followed for prolapse and atrophy.  She is using Vagifem and her cardiologist is aware of this.  She has had a CABG.  She is now off Eliquis 6 - 12 months ago, and is using vaginal estradiol. Not sexually active.   Normal bowel function.  No accidental leakage of stool.  Voiding regularly.  No urinary incontinence.   PCP:   Burnard Bunting, MD  Patient's last menstrual period was 12/28/2004 (approximate).           Sexually active: No.  The current method of family planning is post menopausal status.    Exercising: Yes.     Twice a week at gym, workout at home 2-3 times a week. Smoker:  no  Health Maintenance: Pap:  11/13/20 negative, 09-14-18 neg, 08-19-16 neg HPV HR neg  History of abnormal Pap:  no MMG: 11/06/22 Breast Composition Category B, BI-RADS CATEGORY 1 Negative Colonoscopy:  2017 - Dr. Tyrone Sage.  Normal per patient.  Due in 10 years.  BMD:   n/a  Result  n/a TDaP:  12/28/2014 Gardasil:   no HIV: n/a Hep C: n/a Screening Labs:  PCP   reports that she has never smoked. She has never used smokeless tobacco. She reports that she does not drink alcohol and does not use drugs.  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   LIMA to LAD, SVG to ramus intermediate, SVG to PDA, EVH via right thigh    Past Surgical History:  Procedure Laterality Date   bladder tack     BREAST SURGERY  1973   -benign Lt.breast bx   CHOLECYSTECTOMY OPEN  1985   CORONARY ARTERY BYPASS GRAFT N/A 05/02/2021   Procedure: CORONARY ARTERY BYPASS GRAFTING (CABG) X THREE , USING LEFT INTERNAL MAMMARY ARTERY AND RIGHT GREATER SAPHENOUS VEIN HARVESTED ENDOSCOPICALLY;  Surgeon: Rexene Alberts, MD;  Location: [facility];   Service: Open Heart Surgery;  Laterality: N/A;   LEFT HEART CATH AND CORONARY ANGIOGRAPHY N/A 05/01/2021   Procedure: LEFT HEART CATH AND CORONARY ANGIOGRAPHY;  Surgeon: Burnell Blanks, MD;  Location: [facility];  Service: Cardiovascular;  Laterality: N/A;   TEE WITHOUT CARDIOVERSION N/A 05/02/2021   Procedure: TRANSESOPHAGEAL ECHOCARDIOGRAM (TEE);  Surgeon: Rexene Alberts, MD;  Location: [facility];  Service: Open Heart Surgery;  Laterality: N/A;    Current Outpatient Medications  Medication Sig Dispense Refill   ACCU-CHEK AVIVA PLUS test strip 1 each by Other route every morning.     Accu-Chek FastClix Lancets MISC use to self monitor blood glucose four to six times per day     ALPRAZolam (XANAX) 0.25 MG tablet Take 0.25 mg by mouth every 6 (six) hours as needed for anxiety.     Ascorbic Acid (VITAMIN C GUMMIES PO) Take 250 mg by mouth daily.     aspirin 81 MG chewable tablet Chew 81 mg by mouth daily.     atorvastatin (LIPITOR) 80 MG tablet TAKE 1 TABLET BY MOUTH EVERY DAY 90 tablet 3   BD PEN NEEDLE NANO U/F 32G X 4 MM MISC      Calcium Carb-Cholecalciferol (CALCIUM 500 +  D3 PO) Take 1 tablet by mouth daily.     Coenzyme Q10 (COQ-10) 100 MG CAPS Take 100 mg by mouth every evening.     Estradiol (YUVAFEM) 10 MCG TABS vaginal tablet Place 1 tablet (10 mcg total) vaginally 2 (two) times a week. Tuesdays & Fridays 24 tablet 3   furosemide (LASIX) 20 MG tablet TAKE 1 TABLET BY MOUTH EVERY DAY 90 tablet 3   INVOKANA 300 MG TABS Take 300 mg by mouth daily before breakfast.     latanoprost (XALATAN) 0.005 % ophthalmic solution Place 1 drop into both eyes at bedtime.     loratadine (CLARITIN) 10 MG tablet Take 10 mg by mouth daily.     metFORMIN (GLUCOPHAGE-XR) 500 MG 24 hr tablet Take 1,000 mg by mouth 2 (two) times daily with a meal.     metoprolol succinate (TOPROL XL) 25 MG 24 hr tablet Take 1 tablet (25 mg total) by mouth daily. Please keep scheduled appointment for additional  refilld. 90 tablet 0   Multiple Vitamin (MULTIVITAMIN WITH MINERALS) TABS tablet Take 1 tablet by mouth daily at 2 PM. Multivitamin w/Omega-3     Multiple Vitamins-Minerals (PRESERVISION AREDS 2) CAPS 1 capsule Orally daily     Tirzepatide [facility]) Inject into the skin.     TRESIBA FLEXTOUCH 200 UNIT/ML SOPN Inject 22 Units into the skin at bedtime.  3   losartan (COZAAR) 25 MG tablet TAKE 1 TABLET (25 MG TOTAL) BY MOUTH DAILY. 90 tablet 3   TRULICITY 1.5 OA/4.1YS SOPN Inject 1.5 mg into the skin every Wednesday. (Patient not taking: Reported on 12/30/2022)  3   Current Facility-Administered Medications  Medication Dose Route Frequency Provider Last Rate Last Admin   sodium chloride flush (NS) 0.9 % injection 3 mL  3 mL Intravenous Q12H Crenshaw, Denice Bors, MD        Family History  Problem Relation Age of Onset   Diabetes Mother    Hypertension Mother    Stomach cancer Mother    Heart attack Father        deceased from MI   Diabetes Sister    Diabetes Brother    Hypertension Brother    Cancer Paternal Grandmother        possible colon that went to liver   Cancer Paternal Grandfather        oral cancer    Review of Systems  All other systems reviewed and are negative.   Exam:   BP 110/72 (BP Location: Left Arm, Patient Position: Sitting, Cuff Size: Normal)   Pulse 78   Ht '5\' 4"'$  (1.626 m)   Wt 174 lb (78.9 kg)   LMP 12/28/2004 (Approximate)   SpO2 96%   BMI 29.87 kg/m     General appearance: alert, cooperative and appears stated age Head: normocephalic, without obvious abnormality, atraumatic Neck: no adenopathy, supple, symmetrical, trachea midline and thyroid normal to inspection and palpation Lungs: clear to auscultation bilaterally Breasts: normal appearance, no masses or tenderness, No nipple retraction or dimpling, No nipple discharge or bleeding, No axillary adenopathy Heart: regular rate and rhythm Abdomen: soft, non-tender; no masses, no  organomegaly Extremities: extremities normal, atraumatic, no cyanosis or edema Skin: skin color, texture, turgor normal. No rashes or lesions Lymph nodes: cervical, supraclavicular, and axillary nodes normal. Neurologic: grossly normal  Pelvic: External genitalia:  no lesions              No abnormal inguinal nodes palpated.  Urethra:  normal appearing urethra with no masses, tenderness or lesions              Bartholins and Skenes: normal                 Vagina: normal appearing vagina with normal color and discharge, no lesions. 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               Cervix: no lesions              Pap taken: Yes.   Bimanual Exam:  Uterus:  normal size, contour, position, consistency, mobility, non-tender              Adnexa: no mass, fullness, tenderness              Rectal exam: Yes.  .  Confirms.              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  Stable.  Vaginal atrophy. Status post CABG.  Off Eliquis.  Encounter for medication monitoring.  Cervical cancer screening. Osteoporosis screening.   Plan: Mammogram screening discussed. Self breast awareness reviewed. Pap and HR HPV collected.  We discussed observational care of prolapse, pelvic flor therapy, pessary use, and reconstructive surgery.   Will do observational care at this time. Guidelines for Calcium, Vitamin D, regular exercise program including cardiovascular and weight bearing exercise. She will stop Vagifem.   We reviewed the risks and benefits.  She will try over the counter water based lubricants, vit E, and cooking oils.  BMD at [facility].  I will sign the order once it is faxed.  Follow up annually and prn.   After visit summary provided.   30 min  total  time was spent for this patient encounter, including preparation, face-to-face counseling with the patient, coordination of care, and documentation of the encounter.

## 2022-12-30 ENCOUNTER — Other Ambulatory Visit (HOSPITAL_COMMUNITY)
Admission: RE | Admit: 2022-12-30 | Discharge: 2022-12-30 | Disposition: A | Discharge status: Home / Self Care | Payer: PPO | Source: Ambulatory Visit | Attending: Obstetrics and Gynecology | Admitting: Obstetrics and Gynecology

## 2022-12-30 ENCOUNTER — Encounter: Payer: Self-pay | Admitting: Obstetrics and Gynecology

## 2022-12-30 ENCOUNTER — Ambulatory Visit (INDEPENDENT_AMBULATORY_CARE_PROVIDER_SITE_OTHER): Payer: PPO | Admitting: Obstetrics and Gynecology

## 2022-12-30 VITALS — BP 110/72 | HR 78 | Ht 64.0 in | Wt 174.0 lb

## 2022-12-30 DIAGNOSIS — Z13820 Encounter for screening for osteoporosis: Secondary | ICD-10-CM

## 2022-12-30 DIAGNOSIS — Z5181 Encounter for therapeutic drug level monitoring: Secondary | ICD-10-CM

## 2022-12-30 DIAGNOSIS — Z1151 Encounter for screening for human papillomavirus (HPV): Secondary | ICD-10-CM | POA: Insufficient documentation

## 2022-12-30 DIAGNOSIS — N812 Incomplete uterovaginal prolapse: Secondary | ICD-10-CM

## 2022-12-30 DIAGNOSIS — Z124 Encounter for screening for malignant neoplasm of cervix: Secondary | ICD-10-CM | POA: Diagnosis not present

## 2022-12-30 NOTE — Patient Instructions (Signed)

## 2023-01-01 LAB — CYTOLOGY - PAP
Comment: NEGATIVE
Diagnosis: NEGATIVE
High risk HPV: NEGATIVE

## 2023-01-07 ENCOUNTER — Other Ambulatory Visit: Payer: Self-pay | Admitting: Cardiology

## 2023-01-07 DIAGNOSIS — I255 Ischemic cardiomyopathy: Secondary | ICD-10-CM

## 2023-02-17 DIAGNOSIS — E113293 Type 2 diabetes mellitus with mild nonproliferative diabetic retinopathy without macular edema, bilateral: Secondary | ICD-10-CM | POA: Diagnosis not present

## 2023-02-17 DIAGNOSIS — I2510 Atherosclerotic heart disease of native coronary artery without angina pectoris: Secondary | ICD-10-CM | POA: Diagnosis not present

## 2023-02-17 DIAGNOSIS — I10 Essential (primary) hypertension: Secondary | ICD-10-CM | POA: Diagnosis not present

## 2023-02-17 DIAGNOSIS — Z794 Long term (current) use of insulin: Secondary | ICD-10-CM | POA: Diagnosis not present

## 2023-03-07 ENCOUNTER — Other Ambulatory Visit: Payer: Self-pay | Admitting: Obstetrics and Gynecology

## 2023-03-07 DIAGNOSIS — Z01419 Encounter for gynecological examination (general) (routine) without abnormal findings: Secondary | ICD-10-CM

## 2023-03-08 ENCOUNTER — Other Ambulatory Visit (HOSPITAL_BASED_OUTPATIENT_CLINIC_OR_DEPARTMENT_OTHER): Payer: Self-pay

## 2023-03-08 ENCOUNTER — Other Ambulatory Visit (HOSPITAL_COMMUNITY): Payer: Self-pay

## 2023-03-08 MED ORDER — MOUNJARO 10 MG/0.5ML ~~LOC~~ SOAJ
10.0000 mg | SUBCUTANEOUS | 2 refills | Status: DC
  Filled 2023-03-08: qty 2, 28d supply, fill #0
  Filled 2023-03-26 – 2023-04-15 (×4): qty 2, 28d supply, fill #1
  Filled 2023-05-11: qty 2, 28d supply, fill #2
  Filled 2023-06-14: qty 2, 28d supply, fill #3
  Filled 2023-07-11: qty 2, 28d supply, fill #4
  Filled 2023-08-08: qty 2, 28d supply, fill #5
  Filled 2023-09-04: qty 2, 28d supply, fill #6
  Filled 2023-09-30: qty 2, 28d supply, fill #7
  Filled 2023-10-28 – 2023-10-30 (×2): qty 2, 28d supply, fill #8

## 2023-03-08 NOTE — Telephone Encounter (Signed)
I spoke with patient and let her know that we received refill request from [facility] for Yuvafem tabs. She said she is no longer using them and had spoken with [facility] earlier to take it off her medication list.  Refill denied.  12/30/2022 visit: "She will stop Vagifem.   We reviewed the risks and benefits.  She will try over the counter water based lubricants, vit E, and cooking oils. "

## 2023-03-10 ENCOUNTER — Other Ambulatory Visit (HOSPITAL_COMMUNITY): Payer: Self-pay

## 2023-03-26 ENCOUNTER — Other Ambulatory Visit (HOSPITAL_COMMUNITY): Payer: Self-pay

## 2023-03-30 ENCOUNTER — Other Ambulatory Visit: Payer: Self-pay

## 2023-04-01 ENCOUNTER — Other Ambulatory Visit (HOSPITAL_COMMUNITY): Payer: Self-pay

## 2023-04-07 ENCOUNTER — Other Ambulatory Visit (HOSPITAL_COMMUNITY): Payer: Self-pay

## 2023-04-08 ENCOUNTER — Other Ambulatory Visit: Payer: Self-pay | Admitting: Cardiology

## 2023-04-08 ENCOUNTER — Other Ambulatory Visit: Payer: Self-pay

## 2023-04-08 ENCOUNTER — Other Ambulatory Visit (HOSPITAL_COMMUNITY): Payer: Self-pay

## 2023-04-08 DIAGNOSIS — I255 Ischemic cardiomyopathy: Secondary | ICD-10-CM

## 2023-04-14 ENCOUNTER — Other Ambulatory Visit (HOSPITAL_COMMUNITY): Payer: Self-pay

## 2023-04-15 ENCOUNTER — Other Ambulatory Visit (HOSPITAL_COMMUNITY): Payer: Self-pay

## 2023-05-12 DIAGNOSIS — H2513 Age-related nuclear cataract, bilateral: Secondary | ICD-10-CM | POA: Diagnosis not present

## 2023-05-12 DIAGNOSIS — H401131 Primary open-angle glaucoma, bilateral, mild stage: Secondary | ICD-10-CM | POA: Diagnosis not present

## 2023-05-13 ENCOUNTER — Other Ambulatory Visit (HOSPITAL_COMMUNITY): Payer: Self-pay

## 2023-05-18 ENCOUNTER — Other Ambulatory Visit (HOSPITAL_COMMUNITY): Payer: Self-pay

## 2023-05-19 ENCOUNTER — Other Ambulatory Visit (HOSPITAL_COMMUNITY): Payer: Self-pay

## 2023-05-19 DIAGNOSIS — I25810 Atherosclerosis of coronary artery bypass graft(s) without angina pectoris: Secondary | ICD-10-CM | POA: Diagnosis not present

## 2023-05-19 DIAGNOSIS — Z794 Long term (current) use of insulin: Secondary | ICD-10-CM | POA: Diagnosis not present

## 2023-05-19 DIAGNOSIS — E1169 Type 2 diabetes mellitus with other specified complication: Secondary | ICD-10-CM | POA: Diagnosis not present

## 2023-05-19 DIAGNOSIS — I10 Essential (primary) hypertension: Secondary | ICD-10-CM | POA: Diagnosis not present

## 2023-05-20 ENCOUNTER — Other Ambulatory Visit (HOSPITAL_COMMUNITY): Payer: Self-pay

## 2023-06-14 ENCOUNTER — Other Ambulatory Visit (HOSPITAL_COMMUNITY): Payer: Self-pay

## 2023-07-05 ENCOUNTER — Encounter: Payer: Self-pay | Admitting: Cardiology

## 2023-07-05 ENCOUNTER — Telehealth: Payer: Self-pay | Admitting: Cardiology

## 2023-07-05 NOTE — Telephone Encounter (Signed)
Spoke with pt, Follow up scheduled  

## 2023-07-05 NOTE — Telephone Encounter (Signed)
Patient states watch showed Afib since yesterday morning. She states she is now checking it more often and wonders if she is "stressing myself out". No symptoms. HR 89's, when states in Afib. She will then run a strip again and shows NSR with HR 86. She states will try to send readings to My Chart. States since she saw the watch stating Afib she has checked off an on quite a lot.  Advised to relax as anxiety or stress can attribute.  Make sure she is hydrated.  Will send to the doctor for further review and recommendations.

## 2023-07-05 NOTE — Telephone Encounter (Signed)
Patient states that her watch keeps informing her that she has gone into afib, but does not seem to have any symptoms. She would like a call back to see if her watch may have a malfunction or if she should have this looked at further. Please advise.

## 2023-07-09 ENCOUNTER — Encounter: Payer: Self-pay | Admitting: Physician Assistant

## 2023-07-09 ENCOUNTER — Ambulatory Visit: Payer: PPO | Attending: Physician Assistant | Admitting: Physician Assistant

## 2023-07-09 VITALS — BP 118/64 | HR 84 | Ht 63.0 in | Wt 171.6 lb

## 2023-07-09 DIAGNOSIS — E7800 Pure hypercholesterolemia, unspecified: Secondary | ICD-10-CM

## 2023-07-09 DIAGNOSIS — I255 Ischemic cardiomyopathy: Secondary | ICD-10-CM

## 2023-07-09 DIAGNOSIS — I10 Essential (primary) hypertension: Secondary | ICD-10-CM | POA: Diagnosis not present

## 2023-07-09 DIAGNOSIS — I25810 Atherosclerosis of coronary artery bypass graft(s) without angina pectoris: Secondary | ICD-10-CM | POA: Diagnosis not present

## 2023-07-09 DIAGNOSIS — I4891 Unspecified atrial fibrillation: Secondary | ICD-10-CM | POA: Diagnosis not present

## 2023-07-09 NOTE — Patient Instructions (Signed)
Medication Instructions:  Your physician recommends that you continue on your current medications as directed. Please refer to the Current Medication list given to you today.  *If you need a refill on your cardiac medications before your next appointment, please call your pharmacy*   Lab Work: RETURN FOR FASTING LABS 3 DAYS BEFORE YOUR NEXT APPOINTMENT If you have labs (blood work) drawn today and your tests are completely normal, you will receive your results only by: MyChart Message (if you have MyChart) OR A paper copy in the mail If you have any lab test that is abnormal or we need to change your treatment, we will call you to review the results.   Testing/Procedures: NONE   Follow-Up: At [facility], you and your health needs are our priority.  As part of our continuing mission to provide you with exceptional heart care, we have created designated Provider Care Teams.  These Care Teams include your primary Cardiologist (physician) and Advanced Practice Providers (APPs -  Physician Assistants and Nurse Practitioners) who all work together to provide you with the care you need, when you need it.  We recommend signing up for the patient portal called "MyChart".  Sign up information is provided on this After Visit Summary.  MyChart is used to connect with patients for Virtual Visits (Telemedicine).  Patients are able to view lab/test results, encounter notes, upcoming appointments, etc.  Non-urgent messages can be sent to your provider as well.   To learn more about what you can do with MyChart, go to [email or web address].    Your next appointment:   6 month(s)  Provider:   Olga Millers, MD

## 2023-07-09 NOTE — Progress Notes (Unsigned)
Cardiology Office Note:  .   Date:  07/11/2023  ID:  Crystal Fox, DOB November 22, 1951, MRN [id number] PCP: Geoffry Paradise, MD  [facility] Providers Cardiologist:  Olga Millers, MD     History of Present Illness: .   Crystal Fox is a 72 y.o. female with past medical history of CAD s/p CABG, postop atrial fibrillation, ischemic cardiomyopathy, hypertension and hyperlipidemia.  Echocardiogram in April 2022 showed EF 30 to 35%, akinesis of the mid to distal anteroseptal wall and apex, grade 1 DD, mild mitral and tricuspid regurgitation.  Cardiac catheterization in May 2022 showed severe coronary artery disease.  Preoperative carotid Doppler showed near normal coronary arteries.  She underwent CABG on 05/02/2021 with LIMA-LAD, SVG-PDA and SVG-ramus intermedius.  Postop course complicated by A-fib treated with amiodarone.  Heart monitor in August 2022 showed sinus rhythm with PACs, proved brief PAT, rare PVCs and rare couplet.  Patient had a questionable episode of A-fib in early September 2023 based on her watch.  Eliquis was reinitiated.  Echocardiogram in December 2022 showed EF 50 to 55%, mild RV dysfunction.  He was last seen by Dr. Jens Som in December 2023 at which time she was doing well.  Patient presents today for follow-up.  Recently, her Apple Watch alerted her of irregular heartbeat.  She was able to get a strip from her Samsung watch, this showed sinus rhythm with PACs.  She denies significant palpitation recently.  She has great functional ability and denies any recent exertional chest pain or worsening dyspnea.  Overall, she is doing very well from the cardiac perspective and can follow-up with Dr. Jens Som in 6 months.  Prior to the next visit, patient will need CMP and a fasting lipid panel.  ROS:   She denies chest pain, palpitations, dyspnea, pnd, orthopnea, n, v, dizziness, syncope, edema, weight gain, or early satiety. All other systems reviewed and are otherwise negative  except as noted above.    Studies Reviewed: Marland Kitchen   EKG Interpretation Date/Time:  Friday July 09 2023 09:22:13 EDT Ventricular Rate:  84 PR Interval:  134 QRS Duration:  84 QT Interval:  354 QTC Calculation: 418 R Axis:   84  Text Interpretation: Normal sinus rhythm Low voltage QRS Confirmed by Azalee Course [phone]) on 07/09/2023 10:02:21 AM    Cardiac Studies & Procedures   CARDIAC CATHETERIZATION  CARDIAC CATHETERIZATION 05/01/2021  Narrative  Prox RCA to Mid RCA lesion is 50% stenosed.  RPAV lesion is 40% stenosed.  Mid RCA lesion is 95% stenosed.  Ramus lesion is 99% stenosed.  Mid Cx lesion is 60% stenosed.  Prox LAD to Mid LAD lesion is 100% stenosed.  1. Chronic occlusion mid LAD with faint collateral filling from left to left collaterals 2. Severe stenosis in the small to moderate caliber ramus intermediate branch 3. Moderate stenosis in the mid Circumflex 4. Severe stenosis in the mid segment of the large, dominant RCA. The proximal and mid vessel is calcified.  Recommendations: She will need CT surgery consultation for CABG given multi-vessel CAD, CTO of the LAD and history of diabetes. She is asymptomatic at this time but she does have LV systolic dysfunction. I think revascularization is indicated. We have discussed discharge home and outpatient CT surgery consultation but she would like to remain in the hospital until she meets with the surgeon. Will arrange a telemetry bed.  Findings Coronary Findings Diagnostic  Dominance: Right  Left Anterior Descending Vessel is large. Prox LAD to Mid LAD lesion  is 100% stenosed. The lesion is chronically occluded.  Second Septal Branch Collaterals 2nd Sept filled by collaterals from 1st Sept.  Ramus Intermedius Vessel is moderate in size. Ramus lesion is 99% stenosed.  Left Circumflex Mid Cx lesion is 60% stenosed.  Right Coronary Artery Vessel is large. Prox RCA to Mid RCA lesion is 50% stenosed. The lesion is  calcified. Mid RCA lesion is 95% stenosed. The lesion is calcified.  Right Posterior Atrioventricular Artery RPAV lesion is 40% stenosed.  Intervention  No interventions have been documented.     ECHOCARDIOGRAM  ECHOCARDIOGRAM COMPLETE 11/28/2021  Narrative ECHOCARDIOGRAM REPORT    Patient Name:   Crystal Fox Date of Exam: 11/28/2021 Medical Rec #:  [id number]       Height:       64.0 in Accession #:    [id number]      Weight:       174.8 lb Date of Birth:  Jul 25, 1951       BSA:          1.848 m Patient Age:    70 years        BP:           122/60 mmHg Patient Gender: F               HR:           74 bpm. Exam Location:  [address]  Procedure: 2D Echo, Cardiac Doppler, Color Doppler and Intracardiac Opacification Agent  Indications:    I25.5 Ischemic cardiomyopathy  History:        Patient has prior history of Echocardiogram examinations, most recent 05/02/2021. CAD, Prior CABG; Risk Factors:Hypertension, Diabetes and Dyslipidemia. Ischemic cardiomyopathy.  Sonographer:    Sedonia Small Rodgers-Jones RDCS Referring Phys: [id number] BRIAN S CRENSHAW  IMPRESSIONS   1. Left ventricular ejection fraction, by estimation, is 50 to 55%. The left ventricle has normal function. The left ventricle demonstrates regional wall motion abnormalities (see scoring diagram/findings for description). Left ventricular diastolic parameters are indeterminate. 2. Right ventricular systolic function is mildly reduced. The right ventricular size is normal. Tricuspid regurgitation signal is inadequate for assessing PA pressure. 3. The mitral valve is normal in structure. Trivial mitral valve regurgitation. No evidence of mitral stenosis. 4. The aortic valve is grossly normal. Aortic valve regurgitation is not visualized. No aortic stenosis is present. 5. The inferior vena cava is normal in size with greater than 50% respiratory variability, suggesting right atrial pressure of 3 mmHg.  Comparison(s): A  prior study was performed on 04/23/21. Prior images reviewed side by side. LVEF and RWMA have improved.  FINDINGS Left Ventricle: Left ventricular ejection fraction, by estimation, is 50 to 55%. The left ventricle has normal function. The left ventricle demonstrates regional wall motion abnormalities. Definity contrast agent was given IV to delineate the left ventricular endocardial borders. The left ventricular internal cavity size was normal in size. There is no left ventricular hypertrophy. Abnormal (paradoxical) septal motion consistent with post-operative status. Left ventricular diastolic parameters are indeterminate.   LV Wall Scoring: The mid and distal anterior septum and mid inferoseptal segment are hypokinetic.  Right Ventricle: The right ventricular size is normal. No increase in right ventricular wall thickness. Right ventricular systolic function is mildly reduced. Tricuspid regurgitation signal is inadequate for assessing PA pressure.  Left Atrium: Left atrial size was normal in size.  Right Atrium: Right atrial size was normal in size.  Pericardium: There is no evidence of pericardial effusion.  Mitral Valve:  The mitral valve is normal in structure. Trivial mitral valve regurgitation. No evidence of mitral valve stenosis.  Tricuspid Valve: The tricuspid valve is normal in structure. Tricuspid valve regurgitation is trivial. No evidence of tricuspid stenosis.  Aortic Valve: The aortic valve is grossly normal. Aortic valve regurgitation is not visualized. No aortic stenosis is present.  Pulmonic Valve: The pulmonic valve was normal in structure. Pulmonic valve regurgitation is trivial. No evidence of pulmonic stenosis.  Aorta: The aortic root is normal in size and structure.  Venous: The inferior vena cava is normal in size with greater than 50% respiratory variability, suggesting right atrial pressure of 3 mmHg.  IAS/Shunts: No atrial level shunt detected by color flow  Doppler.   LEFT VENTRICLE PLAX 2D LVIDd:         4.70 cm   Diastology LVIDs:         2.80 cm   LV e' medial:    4.24 cm/s LV PW:         0.90 cm   LV E/e' medial:  20.0 LV IVS:        0.90 cm   LV e' lateral:   10.20 cm/s LVOT diam:     1.80 cm   LV E/e' lateral: 8.3 LV SV:         48 LV SV Index:   26 LVOT Area:     2.54 cm   RIGHT VENTRICLE RV Basal diam:  3.80 cm RV S prime:     9.88 cm/s TAPSE (M-mode): 0.9 cm  LEFT ATRIUM             Index        RIGHT ATRIUM           Index LA diam:        3.90 cm 2.11 cm/m   RA Area:     11.20 cm LA Vol (A2C):   37.7 ml 20.41 ml/m  RA Volume:   23.40 ml  12.67 ml/m LA Vol (A4C):   45.2 ml 24.46 ml/m LA Biplane Vol: 41.8 ml 22.62 ml/m AORTIC VALVE LVOT Vmax:   85.60 cm/s LVOT Vmean:  59.400 cm/s LVOT VTI:    0.189 m  AORTA Ao Root diam: 2.70 cm Ao Asc diam:  3.40 cm  MITRAL VALVE MV Area (PHT): 3.85 cm    SHUNTS MV Decel Time: 197 msec    Systemic VTI:  0.19 m MV E velocity: 84.90 cm/s  Systemic Diam: 1.80 cm MV A velocity: 82.80 cm/s MV E/A ratio:  1.03  Weston Brass MD Electronically signed by Weston Brass MD Signature Date/Time: 11/28/2021/9:28:26 PM    Final   TEE  ECHO INTRAOPERATIVE TEE 05/02/2021  Narrative *INTRAOPERATIVE TRANSESOPHAGEAL REPORT *    Patient Name:   Crystal Fox Date of Exam: 05/02/2021 Medical Rec #:  [id number]       Height:       63.5 in Accession #:    [id number]      Weight:       175.9 lb Date of Birth:  29-May-1951       BSA:          1.84 m Patient Age:    70 years        BP:           112/49 mmHg Patient Gender: F               HR:  65 bpm. Exam Location:  Anesthesiology  Transesophogeal exam was perform intraoperatively during surgical procedure. Patient was closely monitored under general anesthesia during the entirety of examination.  Indications:     CAD Native Vessel Sonographer:     Thurman Coyer RDCS (AE) Performing Phys: [id number] Salvatore Decent  OWEN Diagnosing Phys: Kipp Brood MD  Complications: No known complications during this procedure. PRE-OP FINDINGS Left Ventricle: There was moderate to severe left ventricular systolic dysfunction with the LV ejection fraction calculated at 30-35% using the 2D Simpson's method in the 4 chamber and 2 chamber views. The LV end-diastolic diameter was at the upper limits of normal at 5.0 cm at the mid-papillary level in the trans-gastric short axis view. The mid to distal anterior wall and anterior septum were thinned and akinetic. The remaining LV segments had normal wall thickness and normal contractility.  On the post-bypass exam, the LV size and systolic function was unchanged from the pre-bypass exam.   Right Ventricle: The right ventricle has normal systolic function. The cavity was not dilated. There is no increase in right ventricular wall thickness. On the post-bypass exam, the RV size and systolic function were normal and unchanged from the pre-bypass exam.  Left Atrium: No left atrial/left atrial appendage thrombus was detected. The left atrial diameter was at the upper limits of normal and measured 3.9 cm in the medial-lateral dimension.  Right Atrium: Right atrial size was normal in size.  Interatrial Septum: No atrial level shunt detected by color flow Doppler. There is no evidence of a patent foramen ovale.  Pericardium: There is no evidence of pericardial effusion.  Mitral Valve: The mitral valve is normal in structure. Mitral valve regurgitation is trivial by color flow Doppler. There is No evidence of mitral stenosis. Normal leaflet thickness. No prolapsing or flail leaflet segnments.  Tricuspid Valve: The tricuspid valve was normal in structure. Tricuspid valve regurgitation is mild by color flow Doppler. No evidence of tricuspid stenosis is present.  Aortic Valve: The aortic valve is tricuspid Aortic valve regurgitation was not visualized by color flow Doppler. There is no  stenosis of the aortic valve. Normal leaflet thickness.  Pulmonic Valve: The pulmonic valve was normal in structure, with normal. No evidence of pumonic stenosis. Pulmonic valve regurgitation is trivial by color flow Doppler.   Aorta: The is normal in size and structure. The aortic root and proximal ascending aorta were normal in diameter with a well-aoetic root defined and sino-tubular junction without effacement. There was mild intimal thickening present but no protruding atheromatous plaques or calcification noted.   Shunts: There is no evidence of an atrial septal defect.  +--------------+--------++ LEFT VENTRICLE         +--------------+--------++ PLAX 2D                +--------------+--------++ LVIDd:        50.00 cm +--------------+--------++                        +--------------+--------++  +---------------+-------++ RIGHT VENTRICLE        +---------------+-------++ RV Basal diam: 3.15 cm +---------------+-------++  +-----------+-------++----------++ LEFT ATRIUM       Index      +-----------+-------++----------++ LA diam:   3.90 cm2.12 cm/m +-----------+-------++----------++  +-------------+-------++ AORTA                +-------------+-------++ Ao Root diam:2.59 cm +-------------+-------++ Ao STJ diam: 2.1 cm  +-------------+-------++ Ao Asc diam: 2.75 cm +-------------+-------++ Ao Desc diam:2.10  cm +-------------+-------++   Kipp Brood MD Electronically signed by Kipp Brood MD Signature Date/Time: 05/02/2021/10:05:18 PM    Final   MONITORS  LONG TERM MONITOR (3-14 DAYS) 08/27/2021  Narrative Patch Wear Time:  10 days and 2 hours (2022-08-13T16:36:57-0400 to 2022-08-23T19:14:14-0400)  Patient had a min HR of 60 bpm, max HR of 156 bpm, and avg HR of 70 bpm. Predominant underlying rhythm was Sinus Rhythm. First Degree AV Block was present. 3 Supraventricular Tachycardia runs occurred,  the run with the fastest interval lasting 12 beats with a max rate of 156 bpm (avg 137 bpm); the run with the fastest interval was also the longest. Some episodes of Supraventricular Tachycardia may be possible Atrial Tachycardia with variable block. Isolated SVEs were rare (<1.0%), SVE Couplets were rare (<1.0%), and SVE Triplets were rare (<1.0%). Isolated VEs were rare (<1.0%), VE Couplets were rare (<1.0%), and no VE Triplets were present.  Sinus rhythm with PACs, brief PAT, rare PVC and rare couplet. Olga Millers, MD   CT SCANS  CT CARDIAC SCORING (SELF PAY ONLY) 04/23/2021  Addendum 04/23/2021  3:32 PM ADDENDUM REPORT: 04/23/2021 15:29  CLINICAL DATA:  Cardiovascular Disease Risk stratification  EXAM: Coronary Calcium Score  TECHNIQUE: A gated, non-contrast computed tomography scan of the heart was performed using 3mm slice thickness. Axial images were analyzed on a dedicated workstation. Calcium scoring of the coronary arteries was performed using the Agatston method.  FINDINGS: Coronary arteries: Normal origins.  Coronary Calcium Score:  Left main: 0  Left anterior descending artery: 10.4  Left circumflex artery: 451  Right coronary artery: 624  Total: 1085  Percentile: 98th  Pericardium: Normal.  Ascending Aorta: Normal caliber.  Scattered calcifications.  Non-cardiac: See separate report from [facility].  IMPRESSION: Coronary calcium score of 1085. This was 98th percentile for age-, race-, and sex-matched controls.  RECOMMENDATIONS: Coronary artery calcium (CAC) score is a strong predictor of incident coronary heart disease (CHD) and provides predictive information beyond traditional risk factors. CAC scoring is reasonable to use in the decision to withhold, postpone, or initiate statin therapy in intermediate-risk or selected borderline-risk asymptomatic adults (age 44-75 years and LDL-C >=70 to <190 mg/dL) who do not have diabetes or  established atherosclerotic cardiovascular disease (ASCVD).* In intermediate-risk (10-year ASCVD risk >=7.5% to <20%) adults or selected borderline-risk (10-year ASCVD risk >=5% to <7.5%) adults in whom a CAC score is measured for the purpose of making a treatment decision the following recommendations have been made:  If CAC=0, it is reasonable to withhold statin therapy and reassess in 5 to 10 years, as long as higher risk conditions are absent (diabetes mellitus, family history of premature CHD in first degree relatives (males <55 years; females <65 years), cigarette smoking, or LDL >=190 mg/dL).  If CAC is 1 to 99, it is reasonable to initiate statin therapy for patients >=55 years of age.  If CAC is >=100 or >=75th percentile, it is reasonable to initiate statin therapy at any age.  Cardiology referral should be considered for patients with CAC scores >=400 or >=75th percentile.  *2018 AHA/ACC/AACVPR/AAPA/ABC/ACPM/ADA/AGS/APhA/ASPC/NLA/PCNA Guideline on the Management of Blood Cholesterol: A Report of the American College of Cardiology/American Heart Association Task Force on Clinical Practice Guidelines. J Am Coll Cardiol. 2019;73(24):3168-3209.  Armanda Magic, MD   Electronically Signed By: Armanda Magic On: 04/23/2021 15:29  Narrative EXAM: OVER-READ INTERPRETATION  CT CHEST  The following report is an over-read performed by radiologist Dr. Trudie Reed of [facility] on 04/23/2021. This  over-read does not include interpretation of cardiac or coronary anatomy or pathology. The coronary calcium score interpretation by the cardiologist is attached.  COMPARISON:  None.  FINDINGS: Aortic atherosclerosis. Mosaic attenuation throughout the lung parenchyma with areas of lucency interspersed with areas of ground-glass attenuation with intervening geographic margins, concerning for widespread air trapping from small airways disease. Within the  visualized portions of the thorax there are no suspicious appearing pulmonary nodules or masses, there is no acute consolidative airspace disease, no pleural effusions, no pneumothorax and no lymphadenopathy. Visualized portions of the upper abdomen are unremarkable. There are no aggressive appearing lytic or blastic lesions noted in the visualized portions of the skeleton.  IMPRESSION: 1. 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 2. Aortic atherosclerosis.  Electronically Signed: By: Trudie Reed M.D. On: 04/23/2021 14:56          Risk Assessment/Calculations:    CHA2DS2-VASc Score = 5   This indicates a 7.2% annual risk of stroke. The patient's score is based upon: CHF History: 1 HTN History: 1 Diabetes History: 0 Stroke History: 0 Vascular Disease History: 1 Age Score: 1 Gender Score: 1           Physical Exam:   VS:  BP 118/64 (BP Location: Left Arm, Patient Position: Sitting, Cuff Size: Normal)   Pulse 84   Ht 5\' 3"  (1.6 m)   Wt 171 lb 9.6 oz (77.8 kg)   LMP 12/28/2004 (Approximate)   SpO2 98%   BMI 30.40 kg/m    Wt Readings from Last 3 Encounters:  07/09/23 171 lb 9.6 oz (77.8 kg)  12/30/22 174 lb (78.9 kg)  12/04/22 173 lb 6.4 oz (78.7 kg)    GEN: Well nourished, well developed in no acute distress NECK: No JVD; No carotid bruits CARDIAC: RRR, no murmurs, rubs, gallops RESPIRATORY:  Clear to auscultation without rales, wheezing or rhonchi  ABDOMEN: Soft, non-tender, non-distended EXTREMITIES:  No edema; No deformity   ASSESSMENT AND PLAN: .    CAD s/p CABG: Denies any recent chest pain.  On aspirin and Lipitor  Postop atrial fibrillation: Occurred after bypass surgery, no recurrence.  Recent recording from her Samsung watch showed sinus rhythm with PACs  Hyperlipidemia: On Lipitor  Ischemic cardiomyopathy: EF improved after bypass surgery  Hypertension: Blood pressure stable.       Dispo: Follow-up with Dr. Jens Som in 6  months.  Signed, Azalee Course, PA

## 2023-07-12 ENCOUNTER — Other Ambulatory Visit: Payer: Self-pay

## 2023-08-06 ENCOUNTER — Other Ambulatory Visit: Payer: Self-pay | Admitting: Cardiology

## 2023-08-06 DIAGNOSIS — I255 Ischemic cardiomyopathy: Secondary | ICD-10-CM

## 2023-08-09 ENCOUNTER — Other Ambulatory Visit (HOSPITAL_COMMUNITY): Payer: Self-pay

## 2023-08-25 DIAGNOSIS — E1169 Type 2 diabetes mellitus with other specified complication: Secondary | ICD-10-CM | POA: Diagnosis not present

## 2023-08-25 DIAGNOSIS — E785 Hyperlipidemia, unspecified: Secondary | ICD-10-CM | POA: Diagnosis not present

## 2023-08-25 DIAGNOSIS — I10 Essential (primary) hypertension: Secondary | ICD-10-CM | POA: Diagnosis not present

## 2023-09-01 DIAGNOSIS — E119 Type 2 diabetes mellitus without complications: Secondary | ICD-10-CM | POA: Diagnosis not present

## 2023-09-01 DIAGNOSIS — E113293 Type 2 diabetes mellitus with mild nonproliferative diabetic retinopathy without macular edema, bilateral: Secondary | ICD-10-CM | POA: Diagnosis not present

## 2023-09-01 DIAGNOSIS — R82998 Other abnormal findings in urine: Secondary | ICD-10-CM | POA: Diagnosis not present

## 2023-09-01 DIAGNOSIS — Z1212 Encounter for screening for malignant neoplasm of rectum: Secondary | ICD-10-CM | POA: Diagnosis not present

## 2023-09-01 DIAGNOSIS — M1990 Unspecified osteoarthritis, unspecified site: Secondary | ICD-10-CM | POA: Diagnosis not present

## 2023-09-01 DIAGNOSIS — E1169 Type 2 diabetes mellitus with other specified complication: Secondary | ICD-10-CM | POA: Diagnosis not present

## 2023-09-01 DIAGNOSIS — Z1331 Encounter for screening for depression: Secondary | ICD-10-CM | POA: Diagnosis not present

## 2023-09-01 DIAGNOSIS — Z794 Long term (current) use of insulin: Secondary | ICD-10-CM | POA: Diagnosis not present

## 2023-09-01 DIAGNOSIS — E785 Hyperlipidemia, unspecified: Secondary | ICD-10-CM | POA: Diagnosis not present

## 2023-09-01 DIAGNOSIS — Z1339 Encounter for screening examination for other mental health and behavioral disorders: Secondary | ICD-10-CM | POA: Diagnosis not present

## 2023-09-01 DIAGNOSIS — Z23 Encounter for immunization: Secondary | ICD-10-CM | POA: Diagnosis not present

## 2023-09-01 DIAGNOSIS — E663 Overweight: Secondary | ICD-10-CM | POA: Diagnosis not present

## 2023-09-01 DIAGNOSIS — Z0000 Encounter for general adult medical examination without abnormal findings: Secondary | ICD-10-CM | POA: Diagnosis not present

## 2023-09-01 DIAGNOSIS — I10 Essential (primary) hypertension: Secondary | ICD-10-CM | POA: Diagnosis not present

## 2023-09-01 DIAGNOSIS — I25810 Atherosclerosis of coronary artery bypass graft(s) without angina pectoris: Secondary | ICD-10-CM | POA: Diagnosis not present

## 2023-09-01 DIAGNOSIS — I480 Paroxysmal atrial fibrillation: Secondary | ICD-10-CM | POA: Diagnosis not present

## 2023-09-06 ENCOUNTER — Other Ambulatory Visit (HOSPITAL_COMMUNITY): Payer: Self-pay

## 2023-10-30 ENCOUNTER — Other Ambulatory Visit (HOSPITAL_COMMUNITY): Payer: Self-pay

## 2023-10-30 ENCOUNTER — Other Ambulatory Visit: Payer: Self-pay | Admitting: Cardiology

## 2023-11-03 DIAGNOSIS — H2513 Age-related nuclear cataract, bilateral: Secondary | ICD-10-CM | POA: Diagnosis not present

## 2023-11-03 DIAGNOSIS — H353132 Nonexudative age-related macular degeneration, bilateral, intermediate dry stage: Secondary | ICD-10-CM | POA: Diagnosis not present

## 2023-11-03 DIAGNOSIS — H25013 Cortical age-related cataract, bilateral: Secondary | ICD-10-CM | POA: Diagnosis not present

## 2023-11-03 DIAGNOSIS — E113293 Type 2 diabetes mellitus with mild nonproliferative diabetic retinopathy without macular edema, bilateral: Secondary | ICD-10-CM | POA: Diagnosis not present

## 2023-11-03 DIAGNOSIS — H04123 Dry eye syndrome of bilateral lacrimal glands: Secondary | ICD-10-CM | POA: Diagnosis not present

## 2023-11-03 DIAGNOSIS — H524 Presbyopia: Secondary | ICD-10-CM | POA: Diagnosis not present

## 2023-11-03 DIAGNOSIS — H52203 Unspecified astigmatism, bilateral: Secondary | ICD-10-CM | POA: Diagnosis not present

## 2023-11-03 DIAGNOSIS — H401131 Primary open-angle glaucoma, bilateral, mild stage: Secondary | ICD-10-CM | POA: Diagnosis not present

## 2023-11-19 DIAGNOSIS — Z1231 Encounter for screening mammogram for malignant neoplasm of breast: Secondary | ICD-10-CM | POA: Diagnosis not present

## 2023-11-22 ENCOUNTER — Encounter: Payer: Self-pay | Admitting: Obstetrics and Gynecology

## 2023-11-27 ENCOUNTER — Other Ambulatory Visit (HOSPITAL_COMMUNITY): Payer: Self-pay

## 2023-11-29 ENCOUNTER — Other Ambulatory Visit (HOSPITAL_COMMUNITY): Payer: Self-pay

## 2023-11-29 MED ORDER — MOUNJARO 10 MG/0.5ML ~~LOC~~ SOAJ
10.0000 mg | SUBCUTANEOUS | 2 refills | Status: AC
Start: 2023-11-29 — End: ?
  Filled 2023-11-29: qty 6, 84d supply, fill #0
  Filled 2024-02-17 – 2024-02-21 (×2): qty 6, 84d supply, fill #1
  Filled 2024-05-13: qty 6, 84d supply, fill #2
  Filled 2024-05-17: qty 2, 28d supply, fill #2

## 2023-11-30 ENCOUNTER — Other Ambulatory Visit (HOSPITAL_COMMUNITY): Payer: Self-pay

## 2023-12-08 DIAGNOSIS — Z794 Long term (current) use of insulin: Secondary | ICD-10-CM | POA: Diagnosis not present

## 2023-12-08 DIAGNOSIS — I10 Essential (primary) hypertension: Secondary | ICD-10-CM | POA: Diagnosis not present

## 2023-12-08 DIAGNOSIS — E1169 Type 2 diabetes mellitus with other specified complication: Secondary | ICD-10-CM | POA: Diagnosis not present

## 2023-12-08 DIAGNOSIS — I25810 Atherosclerosis of coronary artery bypass graft(s) without angina pectoris: Secondary | ICD-10-CM | POA: Diagnosis not present

## 2023-12-31 ENCOUNTER — Other Ambulatory Visit: Payer: Self-pay | Admitting: Cardiology

## 2023-12-31 DIAGNOSIS — I255 Ischemic cardiomyopathy: Secondary | ICD-10-CM

## 2024-02-08 NOTE — Progress Notes (Signed)
HPI: Follow-up coronary artery disease and ischemic cardiomyopathy. Echocardiogram April 2022 showed ejection fraction 30 to 35%, akinesis of the mid distal anteroseptal wall and apex, grade 1 diastolic dysfunction, mild mitral and tricuspid regurgitation.  Cardiac catheterization May 2022 showed severe coronary disease.  Preoperative carotid Dopplers May 2022 showed near normal bilaterally.  Patient underwent coronary artery bypass graft on May 02, 2021 with LIMA to the LAD, saphenous vein graft to the PDA and saphenous vein graft to the ramus intermedius. Postoperative course complicated by atrial fibrillation.  This was treated with amiodarone.  Monitor August 2022 showed sinus rhythm with PACs, brief PAT, rare PVC and rare couplet.  Patient had a questionable episode of atrial fibrillation in early September based on her watch and Eliquis was reinitiated. Echocardiogram repeated December 2022 and showed ejection fraction 50 to 55%, mild RV dysfunction.  Since last seen the patient denies any dyspnea on exertion, orthopnea, PND, pedal edema, palpitations, syncope or chest pain.   Current Outpatient Medications  Medication Sig Dispense Refill   ACCU-CHEK AVIVA PLUS test strip 1 each by Other route every morning.     Accu-Chek FastClix Lancets MISC use to self monitor blood glucose four to six times per day     ALPRAZolam (XANAX) 0.25 MG tablet Take 0.25 mg by mouth every 6 (six) hours as needed for anxiety.     Ascorbic Acid (VITAMIN C GUMMIES PO) Take 250 mg by mouth daily.     aspirin 81 MG chewable tablet Chew 81 mg by mouth daily.     atorvastatin (LIPITOR) 80 MG tablet TAKE 1 TABLET BY MOUTH EVERY DAY 90 tablet 1   BD PEN NEEDLE NANO U/F 32G X 4 MM MISC      Calcium Carb-Cholecalciferol (CALCIUM 500 + D3 PO) Take 1 tablet by mouth daily.     Coenzyme Q10 (COQ-10) 100 MG CAPS Take 100 mg by mouth every evening.     furosemide (LASIX) 20 MG tablet TAKE 1 TABLET BY MOUTH EVERY DAY 90 tablet  2   INVOKANA 300 MG TABS Take 300 mg by mouth daily before breakfast.     latanoprost (XALATAN) 0.005 % ophthalmic solution Place 1 drop into both eyes at bedtime.     loratadine (CLARITIN) 10 MG tablet Take 10 mg by mouth daily.     metFORMIN (GLUCOPHAGE-XR) 500 MG 24 hr tablet Take 1,000 mg by mouth 2 (two) times daily with a meal.     metoprolol succinate (TOPROL-XL) 25 MG 24 hr tablet TAKE 1 TABLET (25 MG TOTAL) BY MOUTH DAILY. 90 tablet 1   Multiple Vitamin (MULTIVITAMIN WITH MINERALS) TABS tablet Take 1 tablet by mouth daily at 2 PM. Multivitamin w/Omega-3     Multiple Vitamins-Minerals (PRESERVISION AREDS 2) CAPS 1 capsule Orally daily     tirzepatide (MOUNJARO) 10 MG/0.5ML Pen Inject 10 mg into the skin once a week. 6 mL 2   TRESIBA FLEXTOUCH 200 UNIT/ML SOPN Inject 22 Units into the skin at bedtime.  3   losartan (COZAAR) 25 MG tablet TAKE 1 TABLET (25 MG TOTAL) BY MOUTH DAILY. 90 tablet 3   No current facility-administered medications for this visit.     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   LIMA to LAD, SVG to ramus intermediate, SVG to PDA, EVH via right thigh    Past Surgical History:  Procedure Laterality Date   bladder tack     BREAST SURGERY  1973   -benign Lt.breast bx   CHOLECYSTECTOMY OPEN  1985   CORONARY ARTERY BYPASS GRAFT N/A 05/02/2021   Procedure: CORONARY ARTERY BYPASS GRAFTING (CABG) X THREE , USING LEFT INTERNAL MAMMARY ARTERY AND RIGHT GREATER SAPHENOUS VEIN HARVESTED ENDOSCOPICALLY;  Surgeon: Purcell Nails, MD;  Location: [facility] OR;  Service: Open Heart Surgery;  Laterality: N/A;   LEFT HEART CATH AND CORONARY ANGIOGRAPHY N/A 05/01/2021   Procedure: LEFT HEART CATH AND CORONARY ANGIOGRAPHY;  Surgeon: Kathleene Hazel, MD;  Location: [facility]  INVASIVE CV LAB;  Service: Cardiovascular;  Laterality: N/A;   TEE WITHOUT CARDIOVERSION N/A 05/02/2021   Procedure: TRANSESOPHAGEAL ECHOCARDIOGRAM (TEE);  Surgeon: Purcell Nails, MD;  Location: [facility] OR;  Service: Open Heart Surgery;  Laterality: N/A;    Social History   Socioeconomic History   Marital status: Widowed    Spouse name: Not on file   Number of children: 5   Years of education: Not on file   Highest education level: Not on file  Occupational History   Not on file  Tobacco Use   Smoking status: Never   Smokeless tobacco: Never  Vaping Use   Vaping status: Never Used  Substance and Sexual Activity   Alcohol use: No   Drug use: No   Sexual activity: Not Currently    Partners: Male    Birth control/protection: Post-menopausal    Comment: husband vasectomy  Other Topics Concern   Not on file  Social History Narrative   Not on file   Social Drivers of Health   Financial Resource Strain: Not on file  Food Insecurity: Not on file  Transportation Needs: Not on file  Physical Activity: Not on file  Stress: Not on file  Social Connections: Not on file  Intimate Partner Violence: Not on file    Family History  Problem Relation Age of Onset   Diabetes Mother    Hypertension Mother    Stomach cancer Mother    Heart attack Father        deceased from MI   Diabetes Sister    Diabetes Brother    Hypertension Brother    Cancer Paternal Grandmother        possible colon that went to liver   Cancer Paternal Grandfather        oral cancer    ROS: no fevers or chills, productive cough, hemoptysis, dysphasia, odynophagia, melena, hematochezia, dysuria, hematuria, rash, seizure activity, orthopnea, PND, pedal edema, claudication. Remaining systems are negative.  Physical Exam: Well-developed well-nourished in no acute distress.  Skin is warm and dry.  HEENT is normal.  Neck is supple.  Chest is clear to auscultation with normal expansion.  Cardiovascular exam is  regular rate and rhythm.  Abdominal exam nontender or distended. No masses palpated. Extremities show no edema. neuro grossly intact   A/P  1 coronary artery disease-patient denies chest pain.  Continue aspirin and statin.  2 history of ischemic cardiomyopathy-LV function improved on most recent echocardiogram.  Continue losartan and Toprol at present dose.  3 hyperlipidemia-continue statin.  Lipids and liver monitored by primary care.  4 postoperative atrial fibrillation-no recurrences.  5 hypertension-blood pressure controlled.  Continue present medical regimen.  Potassium and renal function monitored by primary care.  Olga Millers, MD

## 2024-02-17 ENCOUNTER — Other Ambulatory Visit (HOSPITAL_COMMUNITY): Payer: Self-pay

## 2024-02-18 ENCOUNTER — Ambulatory Visit: Payer: PPO | Attending: Cardiology | Admitting: Cardiology

## 2024-02-18 ENCOUNTER — Encounter: Payer: Self-pay | Admitting: Cardiology

## 2024-02-18 VITALS — BP 120/58 | HR 93 | Ht 63.0 in | Wt 172.0 lb

## 2024-02-18 DIAGNOSIS — I10 Essential (primary) hypertension: Secondary | ICD-10-CM | POA: Diagnosis not present

## 2024-02-18 DIAGNOSIS — I255 Ischemic cardiomyopathy: Secondary | ICD-10-CM

## 2024-02-18 DIAGNOSIS — I25810 Atherosclerosis of coronary artery bypass graft(s) without angina pectoris: Secondary | ICD-10-CM

## 2024-02-18 DIAGNOSIS — E7800 Pure hypercholesterolemia, unspecified: Secondary | ICD-10-CM | POA: Diagnosis not present

## 2024-02-18 NOTE — Patient Instructions (Signed)
Medication Instructions:  Your physician recommends that you continue on your current medications as directed. Please refer to the Current Medication list given to you today.  *If you need a refill on your cardiac medications before your next appointment, please call your pharmacy*    Follow-Up: At [facility], you and your health needs are our priority.  As part of our continuing mission to provide you with exceptional heart care, we have created designated Provider Care Teams.  These Care Teams include your primary Cardiologist (physician) and Advanced Practice Providers (APPs -  Physician Assistants and Nurse Practitioners) who all work together to provide you with the care you need, when you need it.  We recommend signing up for the patient portal called "MyChart".  Sign up information is provided on this After Visit Summary.  MyChart is used to connect with patients for Virtual Visits (Telemedicine).  Patients are able to view lab/test results, encounter notes, upcoming appointments, etc.  Non-urgent messages can be sent to your provider as well.   To learn more about what you can do with MyChart, go to [email or web address].    Your next appointment:   12 month(s)  Provider:   Olga Millers, MD     Other Instructions    1st Floor: - Lobby - Registration  - Pharmacy  - Lab - Cafe  2nd Floor: - PV Lab - Diagnostic Testing (echo, CT, nuclear med)  3rd Floor: - Vacant  4th Floor: - TCTS (cardiothoracic surgery) - AFib Clinic - Structural Heart Clinic - Vascular Surgery  - Vascular Ultrasound  5th Floor: - [facility] (general and EP) - Clinical Pharmacy for coumadin, hypertension, lipid, weight-loss medications, and med management appointments    Valet parking services will be available as well.

## 2024-02-22 ENCOUNTER — Other Ambulatory Visit (HOSPITAL_COMMUNITY): Payer: Self-pay

## 2024-03-15 DIAGNOSIS — E1169 Type 2 diabetes mellitus with other specified complication: Secondary | ICD-10-CM | POA: Diagnosis not present

## 2024-03-15 DIAGNOSIS — G629 Polyneuropathy, unspecified: Secondary | ICD-10-CM | POA: Diagnosis not present

## 2024-03-15 DIAGNOSIS — E785 Hyperlipidemia, unspecified: Secondary | ICD-10-CM | POA: Diagnosis not present

## 2024-03-15 DIAGNOSIS — Z794 Long term (current) use of insulin: Secondary | ICD-10-CM | POA: Diagnosis not present

## 2024-03-15 DIAGNOSIS — I480 Paroxysmal atrial fibrillation: Secondary | ICD-10-CM | POA: Diagnosis not present

## 2024-03-15 DIAGNOSIS — E663 Overweight: Secondary | ICD-10-CM | POA: Diagnosis not present

## 2024-03-15 DIAGNOSIS — I10 Essential (primary) hypertension: Secondary | ICD-10-CM | POA: Diagnosis not present

## 2024-03-15 DIAGNOSIS — I25810 Atherosclerosis of coronary artery bypass graft(s) without angina pectoris: Secondary | ICD-10-CM | POA: Diagnosis not present

## 2024-03-22 NOTE — Progress Notes (Signed)
 73 y.o. G64P1001 Widowed Caucasian female here for a breast and pelvic exam.    The patient is also followed for prolapse and urogenital atrophy. Stopped Vagifem.  Using a natural suppository, Femalay, with vit E.    Navigating her grief journey.   PCP: Geoffry Paradise, MD   Patient's last menstrual period was 12/28/2004 (approximate).           Sexually active: No.  The current method of family planning is post menopausal status.    Menopausal hormone therapy:  n/a Exercising: Yes.     Gym2x a week, strength training, elliptical Smoker:  no  OB History     Gravida  1   Para  1   Term  1   Preterm  0   AB  0   Living  1      SAB  0   IAB  0   Ectopic  0   Multiple  0   Live Births  1           HEALTH MAINTENANCE: Last 2 paps: 12/30/22 neg: HR HPV neg, 11-13-20 neg History of abnormal Pap or positive HPV:  no Mammogram:  11/19/23 Breast density cat B, BI-RADS CAT 1 neg Colonoscopy:  2017 normal - due in 2027. Bone Density:  n/a  Result  n/a   Immunization History  Administered Date(s) Administered   Influenza Split 08/27/2010, 10/01/2011, 12/07/2012, 12/28/2012, 12/28/2013   Influenza, Quadrivalent, Recombinant, Inj, Pf 10/21/2018, 10/13/2019, 10/02/2020, 10/15/2021   Influenza-Unspecified 10/22/2017   Moderna Sars-Covid-2 Vaccination 02/24/2020, 03/23/2020   Pneumococcal Conjugate-13 12/14/2018   Tdap 12/28/2014   Zoster, Live 08/25/2013      reports that she has never smoked. She has never used smokeless tobacco. She reports that she does not drink alcohol and does not use drugs.  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   LIMA to LAD, SVG to ramus intermediate, SVG to PDA, EVH via right thigh    Past Surgical History:  Procedure  Laterality Date   bladder tack     BREAST SURGERY  1973   -benign Lt.breast bx   CHOLECYSTECTOMY OPEN  1985   CORONARY ARTERY BYPASS GRAFT N/A 05/02/2021   Procedure: CORONARY ARTERY BYPASS GRAFTING (CABG) X THREE , USING LEFT INTERNAL MAMMARY ARTERY AND RIGHT GREATER SAPHENOUS VEIN HARVESTED ENDOSCOPICALLY;  Surgeon: Purcell Nails, MD;  Location: [facility] OR;  Service: Open Heart Surgery;  Laterality: N/A;   LEFT HEART CATH AND CORONARY ANGIOGRAPHY N/A 05/01/2021   Procedure: LEFT HEART CATH AND CORONARY ANGIOGRAPHY;  Surgeon: Kathleene Hazel, MD;  Location: [facility] INVASIVE CV LAB;  Service: Cardiovascular;  Laterality: N/A;   TEE WITHOUT CARDIOVERSION N/A 05/02/2021   Procedure: TRANSESOPHAGEAL ECHOCARDIOGRAM (TEE);  Surgeon: Purcell Nails, MD;  Location: [facility] OR;  Service: Open Heart Surgery;  Laterality: N/A;    Current Outpatient Medications  Medication Sig Dispense Refill   ACCU-CHEK AVIVA PLUS test strip 1 each by Other route every morning.     Accu-Chek FastClix Lancets MISC use to self monitor blood glucose four to six times per day     ALPRAZolam (XANAX) 0.25 MG tablet Take 0.25 mg by mouth every 6 (six) hours as needed  for anxiety.     Ascorbic Acid (VITAMIN C GUMMIES PO) Take 250 mg by mouth daily.     aspirin 81 MG chewable tablet Chew 81 mg by mouth daily.     atorvastatin (LIPITOR) 80 MG tablet TAKE 1 TABLET BY MOUTH EVERY DAY 90 tablet 1   BD PEN NEEDLE NANO U/F 32G X 4 MM MISC      Calcium Carb-Cholecalciferol (CALCIUM 500 + D3 PO) Take 1 tablet by mouth daily.     Coenzyme Q10 (COQ-10) 100 MG CAPS Take 100 mg by mouth every evening.     furosemide (LASIX) 20 MG tablet TAKE 1 TABLET BY MOUTH EVERY DAY 90 tablet 2   INVOKANA 300 MG TABS Take 300 mg by mouth daily before breakfast.     latanoprost (XALATAN) 0.005 % ophthalmic solution Place 1 drop into both eyes at bedtime.     loratadine (CLARITIN) 10 MG tablet Take 10 mg by mouth daily.     metFORMIN (GLUCOPHAGE-XR) 500 MG 24  hr tablet Take 1,000 mg by mouth 2 (two) times daily with a meal.     metoprolol succinate (TOPROL-XL) 25 MG 24 hr tablet TAKE 1 TABLET (25 MG TOTAL) BY MOUTH DAILY. 90 tablet 1   Multiple Vitamin (MULTIVITAMIN WITH MINERALS) TABS tablet Take 1 tablet by mouth daily at 2 PM. Multivitamin w/Omega-3     Multiple Vitamins-Minerals (PRESERVISION AREDS 2) CAPS 1 capsule Orally daily     tirzepatide (MOUNJARO) 10 MG/0.5ML Pen Inject 10 mg into the skin once a week. 6 mL 2   TRESIBA FLEXTOUCH 200 UNIT/ML SOPN Inject 22 Units into the skin at bedtime.  3   losartan (COZAAR) 25 MG tablet TAKE 1 TABLET (25 MG TOTAL) BY MOUTH DAILY. 90 tablet 3   No current facility-administered medications for this visit.    ALLERGIES: Sulfa antibiotics  Family History  Problem Relation Age of Onset   Diabetes Mother    Hypertension Mother    Stomach cancer Mother    Heart attack Father        deceased from MI   Diabetes Sister    Diabetes Brother    Hypertension Brother    Cancer Paternal Grandmother        possible colon that went to liver   Cancer Paternal Grandfather        oral cancer    Review of Systems  All other systems reviewed and are negative.   PHYSICAL EXAM:  BP 132/82 (BP Location: Left Arm, Patient Position: Sitting, Cuff Size: Small)   Pulse 85   Ht 5' 4.5" (1.638 m)   Wt 172 lb (78 kg)   LMP 12/28/2004 (Approximate)   SpO2 98%   BMI 29.07 kg/m     General appearance: alert, cooperative and appears stated age Head: normocephalic, without obvious abnormality, atraumatic Neck: no adenopathy, supple, symmetrical, trachea midline and thyroid normal to inspection and palpation Lungs: clear to auscultation bilaterally Breasts: normal appearance, no masses or tenderness, No nipple retraction or dimpling, No nipple discharge or bleeding, No axillary adenopathy Heart: regular rate and rhythm Abdomen: soft, non-tender; no masses, no organomegaly Extremities: extremities normal,  atraumatic, no cyanosis or edema Skin: skin color, texture, turgor normal. No rashes or lesions Lymph nodes: cervical, supraclavicular, and axillary nodes normal. Neurologic: grossly normal  Pelvic: External genitalia:  no lesions              No abnormal inguinal nodes palpated.  Urethra:  normal appearing urethra with no masses, tenderness or lesions              Bartholins and Skenes: normal                 Vagina: normal appearing vagina with normal color and discharge, no lesions.  Almost second degree bladder prolapse, first degree uterine prolapse, minimal rectocele.              Cervix: no lesions              Pap taken: No. Bimanual Exam:  Uterus:  normal size, contour, position, consistency, mobility, non-tender              Adnexa: no mass, fullness, tenderness              Rectal exam: Yes.  .  Confirms.              Anus:  normal sphincter tone, no lesions  Chaperone was present for exam:   Edwin Dada, CMA  ASSESSMENT: Encounter for breast and pelvic exam.  Menopausal female.  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  Stable.  Vaginal atrophy. Status post CABG.  Off Eliquis.  Encounter for medication monitoring.  Osteoporosis screening.   PLAN: Mammogram screening discussed. Self breast awareness reviewed. Pap and HRV collected:  no.  Due in 2029.  Guidelines for Calcium, Vitamin D, regular exercise program including cardiovascular and weight bearing exercise. Medication refills:  NA Call for prolapse progression, difficulty emptying bladder or having BMs, or incontinence.  BMD at [facility].  Will fax order to them.  Follow up:  2 years and prn.    10  total time was spent for this patient encounter, including preparation, face-to-face counseling with the patient, coordination of care, and documentation of the encounter regarding bone density screening and bone health in addition to doing breast  and pelvic exam.

## 2024-04-05 ENCOUNTER — Encounter: Payer: Self-pay | Admitting: Obstetrics and Gynecology

## 2024-04-05 ENCOUNTER — Ambulatory Visit: Payer: PPO | Admitting: Obstetrics and Gynecology

## 2024-04-05 VITALS — BP 132/82 | HR 85 | Ht 64.5 in | Wt 172.0 lb

## 2024-04-05 DIAGNOSIS — N952 Postmenopausal atrophic vaginitis: Secondary | ICD-10-CM | POA: Diagnosis not present

## 2024-04-05 DIAGNOSIS — Z01419 Encounter for gynecological examination (general) (routine) without abnormal findings: Secondary | ICD-10-CM

## 2024-04-05 DIAGNOSIS — Z780 Asymptomatic menopausal state: Secondary | ICD-10-CM

## 2024-04-05 NOTE — Patient Instructions (Signed)

## 2024-05-03 DIAGNOSIS — H04123 Dry eye syndrome of bilateral lacrimal glands: Secondary | ICD-10-CM | POA: Diagnosis not present

## 2024-05-03 DIAGNOSIS — H401131 Primary open-angle glaucoma, bilateral, mild stage: Secondary | ICD-10-CM | POA: Diagnosis not present

## 2024-05-14 ENCOUNTER — Other Ambulatory Visit: Payer: Self-pay | Admitting: Cardiology

## 2024-05-16 ENCOUNTER — Other Ambulatory Visit (HOSPITAL_COMMUNITY): Payer: Self-pay

## 2024-05-17 ENCOUNTER — Other Ambulatory Visit (HOSPITAL_COMMUNITY): Payer: Self-pay

## 2024-05-18 ENCOUNTER — Other Ambulatory Visit (HOSPITAL_COMMUNITY): Payer: Self-pay

## 2024-05-18 MED ORDER — MOUNJARO 10 MG/0.5ML ~~LOC~~ SOAJ
10.0000 mg | SUBCUTANEOUS | 3 refills | Status: AC
  Filled 2024-05-18: qty 6, 84d supply, fill #0
  Filled 2024-08-06: qty 6, 84d supply, fill #1
  Filled 2024-10-31: qty 6, 84d supply, fill #2

## 2024-05-19 ENCOUNTER — Other Ambulatory Visit (HOSPITAL_COMMUNITY): Payer: Self-pay

## 2024-05-30 DIAGNOSIS — R102 Pelvic and perineal pain: Secondary | ICD-10-CM | POA: Diagnosis not present

## 2024-05-30 DIAGNOSIS — R350 Frequency of micturition: Secondary | ICD-10-CM | POA: Diagnosis not present

## 2024-06-07 DIAGNOSIS — E1169 Type 2 diabetes mellitus with other specified complication: Secondary | ICD-10-CM | POA: Diagnosis not present

## 2024-06-07 DIAGNOSIS — I25810 Atherosclerosis of coronary artery bypass graft(s) without angina pectoris: Secondary | ICD-10-CM | POA: Diagnosis not present

## 2024-06-07 DIAGNOSIS — I10 Essential (primary) hypertension: Secondary | ICD-10-CM | POA: Diagnosis not present

## 2024-06-07 DIAGNOSIS — Z794 Long term (current) use of insulin: Secondary | ICD-10-CM | POA: Diagnosis not present

## 2024-06-16 ENCOUNTER — Other Ambulatory Visit: Payer: Self-pay | Admitting: Cardiology

## 2024-06-16 DIAGNOSIS — I255 Ischemic cardiomyopathy: Secondary | ICD-10-CM

## 2024-06-23 DIAGNOSIS — E1169 Type 2 diabetes mellitus with other specified complication: Secondary | ICD-10-CM | POA: Diagnosis not present

## 2024-07-23 DIAGNOSIS — E1169 Type 2 diabetes mellitus with other specified complication: Secondary | ICD-10-CM | POA: Diagnosis not present

## 2024-07-29 ENCOUNTER — Other Ambulatory Visit: Payer: Self-pay | Admitting: Cardiology

## 2024-07-29 DIAGNOSIS — I255 Ischemic cardiomyopathy: Secondary | ICD-10-CM

## 2024-08-07 ENCOUNTER — Other Ambulatory Visit (HOSPITAL_COMMUNITY): Payer: Self-pay

## 2024-08-23 DIAGNOSIS — E1169 Type 2 diabetes mellitus with other specified complication: Secondary | ICD-10-CM | POA: Diagnosis not present

## 2024-08-30 DIAGNOSIS — Z0189 Encounter for other specified special examinations: Secondary | ICD-10-CM | POA: Diagnosis not present

## 2024-08-30 DIAGNOSIS — I480 Paroxysmal atrial fibrillation: Secondary | ICD-10-CM | POA: Diagnosis not present

## 2024-08-30 DIAGNOSIS — Z794 Long term (current) use of insulin: Secondary | ICD-10-CM | POA: Diagnosis not present

## 2024-08-30 DIAGNOSIS — I10 Essential (primary) hypertension: Secondary | ICD-10-CM | POA: Diagnosis not present

## 2024-08-30 DIAGNOSIS — E1169 Type 2 diabetes mellitus with other specified complication: Secondary | ICD-10-CM | POA: Diagnosis not present

## 2024-08-30 DIAGNOSIS — E785 Hyperlipidemia, unspecified: Secondary | ICD-10-CM | POA: Diagnosis not present

## 2024-09-04 DIAGNOSIS — R82998 Other abnormal findings in urine: Secondary | ICD-10-CM | POA: Diagnosis not present

## 2024-09-04 DIAGNOSIS — E119 Type 2 diabetes mellitus without complications: Secondary | ICD-10-CM | POA: Diagnosis not present

## 2024-09-04 DIAGNOSIS — I25810 Atherosclerosis of coronary artery bypass graft(s) without angina pectoris: Secondary | ICD-10-CM | POA: Diagnosis not present

## 2024-09-04 DIAGNOSIS — Z1331 Encounter for screening for depression: Secondary | ICD-10-CM | POA: Diagnosis not present

## 2024-09-04 DIAGNOSIS — M5432 Sciatica, left side: Secondary | ICD-10-CM | POA: Diagnosis not present

## 2024-09-04 DIAGNOSIS — Z1339 Encounter for screening examination for other mental health and behavioral disorders: Secondary | ICD-10-CM | POA: Diagnosis not present

## 2024-09-04 DIAGNOSIS — I480 Paroxysmal atrial fibrillation: Secondary | ICD-10-CM | POA: Diagnosis not present

## 2024-09-04 DIAGNOSIS — E1169 Type 2 diabetes mellitus with other specified complication: Secondary | ICD-10-CM | POA: Diagnosis not present

## 2024-09-04 DIAGNOSIS — Z23 Encounter for immunization: Secondary | ICD-10-CM | POA: Diagnosis not present

## 2024-09-04 DIAGNOSIS — Z0000 Encounter for general adult medical examination without abnormal findings: Secondary | ICD-10-CM | POA: Diagnosis not present

## 2024-09-04 DIAGNOSIS — E785 Hyperlipidemia, unspecified: Secondary | ICD-10-CM | POA: Diagnosis not present

## 2024-09-23 DIAGNOSIS — E1169 Type 2 diabetes mellitus with other specified complication: Secondary | ICD-10-CM | POA: Diagnosis not present

## 2024-10-05 DIAGNOSIS — Z23 Encounter for immunization: Secondary | ICD-10-CM | POA: Diagnosis not present

## 2024-10-31 ENCOUNTER — Other Ambulatory Visit (HOSPITAL_COMMUNITY): Payer: Self-pay

## 2024-11-01 DIAGNOSIS — H5203 Hypermetropia, bilateral: Secondary | ICD-10-CM | POA: Diagnosis not present

## 2024-11-01 DIAGNOSIS — H524 Presbyopia: Secondary | ICD-10-CM | POA: Diagnosis not present

## 2024-11-01 DIAGNOSIS — E113293 Type 2 diabetes mellitus with mild nonproliferative diabetic retinopathy without macular edema, bilateral: Secondary | ICD-10-CM | POA: Diagnosis not present

## 2024-11-01 DIAGNOSIS — H401131 Primary open-angle glaucoma, bilateral, mild stage: Secondary | ICD-10-CM | POA: Diagnosis not present

## 2024-11-01 DIAGNOSIS — H2513 Age-related nuclear cataract, bilateral: Secondary | ICD-10-CM | POA: Diagnosis not present

## 2024-11-01 DIAGNOSIS — H353132 Nonexudative age-related macular degeneration, bilateral, intermediate dry stage: Secondary | ICD-10-CM | POA: Diagnosis not present

## 2024-11-01 DIAGNOSIS — H52203 Unspecified astigmatism, bilateral: Secondary | ICD-10-CM | POA: Diagnosis not present

## 2024-11-01 DIAGNOSIS — H04123 Dry eye syndrome of bilateral lacrimal glands: Secondary | ICD-10-CM | POA: Diagnosis not present

## 2024-11-20 DIAGNOSIS — Z1231 Encounter for screening mammogram for malignant neoplasm of breast: Secondary | ICD-10-CM | POA: Diagnosis not present

## 2024-11-20 LAB — HM MAMMOGRAPHY

## 2024-11-21 ENCOUNTER — Encounter: Payer: Self-pay | Admitting: Obstetrics and Gynecology

## 2024-11-21 ENCOUNTER — Ambulatory Visit: Payer: Self-pay | Admitting: Obstetrics and Gynecology

## 2024-12-06 ENCOUNTER — Other Ambulatory Visit: Payer: Self-pay | Admitting: Cardiology

## 2024-12-06 DIAGNOSIS — Z794 Long term (current) use of insulin: Secondary | ICD-10-CM | POA: Diagnosis not present

## 2024-12-06 DIAGNOSIS — I25810 Atherosclerosis of coronary artery bypass graft(s) without angina pectoris: Secondary | ICD-10-CM | POA: Diagnosis not present

## 2024-12-06 DIAGNOSIS — I10 Essential (primary) hypertension: Secondary | ICD-10-CM | POA: Diagnosis not present

## 2024-12-06 DIAGNOSIS — E1169 Type 2 diabetes mellitus with other specified complication: Secondary | ICD-10-CM | POA: Diagnosis not present

## 2024-12-06 DIAGNOSIS — I255 Ischemic cardiomyopathy: Secondary | ICD-10-CM

## 2025-01-23 ENCOUNTER — Other Ambulatory Visit: Payer: Self-pay | Admitting: Cardiology

## 2025-01-23 DIAGNOSIS — I255 Ischemic cardiomyopathy: Secondary | ICD-10-CM

## 2025-01-25 NOTE — Telephone Encounter (Signed)
 Patient need make an appointment for refills 1st attempt. Thank

## 2025-01-30 ENCOUNTER — Encounter: Payer: Self-pay | Admitting: Cardiology

## 2025-01-30 ENCOUNTER — Telehealth: Payer: Self-pay | Admitting: Cardiology

## 2025-01-30 NOTE — Telephone Encounter (Signed)
 Patient reports since yesterday morning she has been experiencing fluctuating heart rates. 5-106.  She reports when HR drops to 50's she feels heart palpitations.When she is up walking around HR is 80-90, occasionally 100. When sitting, resting HR drops to 51-55 and this is when she feels palpitations.  Patient also reports she is very anxious, and worries something is wrong with her heart. She is currently taking Mounjaro  and has no appetite, she has discussed this with her diabetes specialist.  Current weight 161 lbs. Last recorded weight on chart 172 lbs in April 2025.  Patient denies any SOB, CP,  or dizziness. She states I actually feel fine. She is just worried.  She took 2 ECG readings on her apple watch and will try to send those to us  via MyChart. She reports 1st ECG showed SR, and 2nd showed a-fib. Patient has hx of PACs.  Reviewed ED precautions, patient verbalized understanding.  Will forwarding to Dr. Pietro to review for further advisement.

## 2025-01-30 NOTE — Telephone Encounter (Signed)
 Patient c/o Palpitations:  STAT if patient reporting lightheadedness, shortness of breath, or chest pain  How long have you had palpitations/irregular HR/ Afib? Are you having the symptoms now? Since 01/29/25 morning, yes she is experiencing fluctuating HR now. States when it gets in the 50's she experiences heart palps.  Are you currently experiencing lightheadedness, SOB or CP? No  Do you have a history of afib (atrial fibrillation) or irregular heart rhythm? No  Have you checked your BP or HR? (document readings if available): HR- 55,51,106  Are you experiencing any other symptoms? No

## 2025-01-31 ENCOUNTER — Ambulatory Visit: Admitting: Physician Assistant

## 2025-01-31 ENCOUNTER — Encounter: Payer: Self-pay | Admitting: Physician Assistant

## 2025-01-31 ENCOUNTER — Ambulatory Visit

## 2025-01-31 VITALS — BP 108/60 | HR 97 | Ht 64.0 in | Wt 163.8 lb

## 2025-01-31 DIAGNOSIS — E785 Hyperlipidemia, unspecified: Secondary | ICD-10-CM

## 2025-01-31 DIAGNOSIS — I480 Paroxysmal atrial fibrillation: Secondary | ICD-10-CM

## 2025-01-31 DIAGNOSIS — I25810 Atherosclerosis of coronary artery bypass graft(s) without angina pectoris: Secondary | ICD-10-CM | POA: Diagnosis not present

## 2025-01-31 DIAGNOSIS — I10 Essential (primary) hypertension: Secondary | ICD-10-CM | POA: Diagnosis not present

## 2025-01-31 DIAGNOSIS — R002 Palpitations: Secondary | ICD-10-CM

## 2025-01-31 DIAGNOSIS — Z951 Presence of aortocoronary bypass graft: Secondary | ICD-10-CM

## 2025-01-31 DIAGNOSIS — I2510 Atherosclerotic heart disease of native coronary artery without angina pectoris: Secondary | ICD-10-CM

## 2025-01-31 LAB — CBC

## 2025-01-31 NOTE — Telephone Encounter (Signed)
 Follow up scheduled

## 2025-01-31 NOTE — Progress Notes (Unsigned)
 " Cardiology Office Note   Date:  02/01/2025  ID:  Nathaniel, Yaden 08-Dec-1951, MRN [id number] PCP: Shepard Ade, MD  [facility] Providers Cardiologist:  Redell Shallow, MD     History of Present Illness Crystal Fox is a 74 y.o. female with past medical history of CAD s/p CABG, postop atrial fibrillation, ischemic cardiomyopathy, hypertension and hyperlipidemia.  Echocardiogram in April 2022 showed EF 30 to 35%, akinesis of the mid to distal anteroseptal wall and apex, grade 1 DD, mild mitral and tricuspid regurgitation.  Cardiac catheterization in May 2022 showed severe coronary artery disease.  Preoperative carotid Doppler showed near normal coronary arteries.  She underwent CABG on 05/02/2021 with LIMA-LAD, SVG-PDA and SVG-ramus intermedius.  Postop course complicated by A-fib treated with amiodarone .  Heart monitor in August 2022 showed sinus rhythm with PACs, proved brief PAT, rare PVCs and rare couplet.  Patient had a questionable episode of A-fib in early September 2023 based on her watch.  Eliquis  was reinitiated.  Echocardiogram in December 2022 showed EF 50 to 55%, mild RV dysfunction.  Patient was last seen by Dr. Shallow in February 2025 at which time she was doing well without any significant issue.  Patient presents today for evaluation of palpitation.  After she recently cut back on caffeine  intake, she started having frequent thumping sensation in her chest.  She denies any recent exertional chest pain or shortness of breath.  She goes to the gym twice a week and is able to do heavy exertion without any symptom.  When she have the thumping sensation in her chest, she denies any dizziness, blurry vision or feeling of passing out.  She forwarded to strips from her Apple Watch to Dr. Shallow, one of the strip was interpreted as A-fib by Crist, but it actually shows sinus rhythm with frequent PVCs/bigeminy based on Dr. Vertie and my personal review.  There is currently  no evidence patient is having A-fib.  She was in ventricular bigeminy with heart rate in the 90s.  She also reportedly had frequent drop in the heart rate down to the high 40s, I am not sure if this is due to her [employer] watch not being able to pick up the PVCs or not.  I recommended initial blood work including CBC, basic metabolic panel and a TSH to rule out secondary causes behind the increased PVC.  I will also repeat echocardiogram and obtain a 3-day heart monitor to quantify the PVC burden.  As long as she does not have significant bradycardia on the heart monitor, my plan is to increase the beta-blocker.  However if the PVC burden is significant, I may also decide to refer her to EP service and obtain stress test.  At this time, I will hold off on stress test as she does not have any anginal symptom.  ROS:   Patient complains of palpitation but denies any chest pain or shortness of breath.  She has no lower extremity edema, orthopnea or PND.  Studies Reviewed EKG Interpretation Date/Time:  Wednesday January 31 2025 09:17:23 EST Ventricular Rate:  97 PR Interval:  134 QRS Duration:  72 QT Interval:  340 QTC Calculation: 431 R Axis:   106  Text Interpretation: Sinus rhythm with frequent Premature ventricular complexes in a pattern of bigeminy No significant ST-T wave changes When compared with ECG of 09-Jul-2023 09:22, Premature ventricular complexes are now Present Septal infarct is now Present Nonspecific T wave abnormality now evident in Inferior leads T  wave inversion now evident in Lateral leads Confirmed by Karema Tocci [phone]) on 02/01/2025 8:01:14 PM    Cardiac Studies & Procedures   ______________________________________________________________________________________________ CARDIAC CATHETERIZATION  CARDIAC CATHETERIZATION 05/01/2021  Conclusion  Prox RCA to Mid RCA lesion is 50% stenosed.  RPAV lesion is 40% stenosed.  Mid RCA lesion is 95% stenosed.  Ramus lesion is 99%  stenosed.  Mid Cx lesion is 60% stenosed.  Prox LAD to Mid LAD lesion is 100% stenosed.  1. Chronic occlusion mid LAD with faint collateral filling from left to left collaterals 2. Severe stenosis in the small to moderate caliber ramus intermediate branch 3. Moderate stenosis in the mid Circumflex 4. Severe stenosis in the mid segment of the large, dominant RCA. The proximal and mid vessel is calcified.  Recommendations: She will need CT surgery consultation for CABG given multi-vessel CAD, CTO of the LAD and history of diabetes. She is asymptomatic at this time but she does have LV systolic dysfunction. I think revascularization is indicated. We have discussed discharge home and outpatient CT surgery consultation but she would like to remain in the hospital until she meets with the surgeon. Will arrange a telemetry bed.  Findings Coronary Findings Diagnostic  Dominance: Right  Left Anterior Descending Vessel is large. Prox LAD to Mid LAD lesion is 100% stenosed. The lesion is chronically occluded.  Second Septal Branch Collaterals 2nd Sept filled by collaterals from 1st Sept.  Ramus Intermedius Vessel is moderate in size. Ramus lesion is 99% stenosed.  Left Circumflex Mid Cx lesion is 60% stenosed.  Right Coronary Artery Vessel is large. Prox RCA to Mid RCA lesion is 50% stenosed. The lesion is calcified. Mid RCA lesion is 95% stenosed. The lesion is calcified.  Right Posterior Atrioventricular Artery RPAV lesion is 40% stenosed.  Intervention  No interventions have been documented.     ECHOCARDIOGRAM  ECHOCARDIOGRAM COMPLETE 02/01/2025  Narrative ECHOCARDIOGRAM REPORT    Patient Name:   Crystal Fox Date of Exam: 02/01/2025 Medical Rec #:  [id number]       Height:       64.0 in Accession #:    [id number]      Weight:       163.8 lb Date of Birth:  11-Jan-1951       BSA:          1.797 m Patient Age:    73 years        BP:           108/60 mmHg Patient  Gender: F               HR:           90 bpm. Exam Location:  [address]  Procedure: 2D Echo, Color Doppler, Cardiac Doppler and Intracardiac Opacification Agent (Both Spectral and Color Flow Doppler were utilized during procedure).  Indications:    I25.10 CAD  History:        Patient has prior history of Echocardiogram examinations, most recent 11/28/2021. CAD, Prior CABG; Risk Factors:Diabetes, Hypertension and Dyslipidemia.  Sonographer:    Powell Saras Referring Phys: [phone] Jaimen Melone  IMPRESSIONS   1. Left ventricular ejection fraction, by estimation, is 40 to 45%. The left ventricle has mildly decreased function. The left ventricle demonstrates global hypokinesis. There is mild concentric left ventricular hypertrophy. Left ventricular diastolic parameters are indeterminate. 2. Right ventricular systolic function is moderately reduced. The right ventricular size is normal. 3. The mitral valve is normal in structure. No  evidence of mitral valve regurgitation. No evidence of mitral stenosis. 4. The aortic valve is normal in structure. Aortic valve regurgitation is not visualized. No aortic stenosis is present. 5. The inferior vena cava is normal in size with greater than 50% respiratory variability, suggesting right atrial pressure of 3 mmHg.  FINDINGS Left Ventricle: Left ventricular ejection fraction, by estimation, is 40 to 45%. The left ventricle has mildly decreased function. The left ventricle demonstrates global hypokinesis. Definity  contrast agent was given IV to delineate the left ventricular endocardial borders. The left ventricular internal cavity size was normal in size. There is mild concentric left ventricular hypertrophy. Left ventricular diastolic parameters are indeterminate.  Right Ventricle: The right ventricular size is normal. No increase in right ventricular wall thickness. Right ventricular systolic function is moderately reduced.  Left Atrium: Left  atrial size was normal in size.  Right Atrium: Right atrial size was normal in size.  Pericardium: There is no evidence of pericardial effusion.  Mitral Valve: The mitral valve is normal in structure. No evidence of mitral valve regurgitation. No evidence of mitral valve stenosis.  Tricuspid Valve: The tricuspid valve is normal in structure. Tricuspid valve regurgitation is not demonstrated. No evidence of tricuspid stenosis.  Aortic Valve: The aortic valve is normal in structure. Aortic valve regurgitation is not visualized. No aortic stenosis is present.  Pulmonic Valve: The pulmonic valve was not assessed. Pulmonic valve regurgitation is trivial. No evidence of pulmonic stenosis.  Aorta: The aortic root is normal in size and structure.  Venous: The inferior vena cava is normal in size with greater than 50% respiratory variability, suggesting right atrial pressure of 3 mmHg.  IAS/Shunts: No atrial level shunt detected by color flow Doppler.   LEFT VENTRICLE PLAX 2D LVIDd:         4.30 cm      Diastology LVIDs:         3.00 cm      LV e' medial:    5.98 cm/s LV PW:         1.10 cm      LV E/e' medial:  9.7 LV IVS:        1.00 cm      LV e' lateral:   9.48 cm/s LVOT diam:     2.00 cm      LV E/e' lateral: 6.1 LV SV:         47 LV SV Index:   26 LVOT Area:     3.14 cm  LV Volumes (MOD) LV vol d, MOD A2C: 104.0 ml LV vol d, MOD A4C: 96.1 ml LV vol s, MOD A2C: 54.8 ml LV vol s, MOD A4C: 53.7 ml LV SV MOD A2C:     49.2 ml LV SV MOD A4C:     96.1 ml LV SV MOD BP:      46.6 ml  RIGHT VENTRICLE            IVC RV Basal diam:  3.80 cm    IVC diam: 1.00 cm RV S prime:     8.72 cm/s TAPSE (M-mode): 0.9 cm  LEFT ATRIUM             Index        RIGHT ATRIUM           Index LA diam:        3.80 cm 2.11 cm/m   RA Area:     13.00 cm LA Vol (A2C):   39.6 ml 22.03  ml/m  RA Volume:   26.40 ml  14.69 ml/m LA Vol (A4C):   50.7 ml 28.21 ml/m LA Biplane Vol: 47.0 ml 26.15  ml/m AORTIC VALVE LVOT Vmax:   84.00 cm/s LVOT Vmean:  58.100 cm/s LVOT VTI:    0.151 m  AORTA Ao Root diam: 2.80 cm Ao Asc diam:  3.50 cm  MITRAL VALVE MV Area (PHT): 5.23 cm    SHUNTS MV Decel Time: 145 msec    Systemic VTI:  0.15 m MV E velocity: 58.10 cm/s  Systemic Diam: 2.00 cm MV A velocity: 80.50 cm/s MV E/A ratio:  0.72  Kardie Tobb DO Electronically signed by Dub Huntsman DO Signature Date/Time: 02/01/2025/1:47:48 PM    Final   TEE  ECHO INTRAOPERATIVE TEE 05/02/2021  Narrative *INTRAOPERATIVE TRANSESOPHAGEAL REPORT *    Patient Name:   BELKIS NORBECK Date of Exam: 05/02/2021 Medical Rec #:  [id number]       Height:       63.5 in Accession #:    [id number]      Weight:       175.9 lb Date of Birth:  [birth year]       BSA:          1.84 m Patient Age:    70 years        BP:           112/49 mmHg Patient Gender: F               HR:           65 bpm. Exam Location:  Anesthesiology  Transesophogeal exam was perform intraoperatively during surgical procedure. Patient was closely monitored under general anesthesia during the entirety of examination.  Indications:     CAD Native Vessel Sonographer:     Augustin Seals RDCS (AE) Performing Phys: [id number] SUDIE DEL OWEN Diagnosing Phys: Alm Laity MD  Complications: No known complications during this procedure. PRE-OP FINDINGS Left Ventricle: There was moderate to severe left ventricular systolic dysfunction with the LV ejection fraction calculated at 30-35% using the 2D Simpson's method in the 4 chamber and 2 chamber views. The LV end-diastolic diameter was at the upper limits of normal at 5.0 cm at the mid-papillary level in the trans-gastric short axis view. The mid to distal anterior wall and anterior septum were thinned and akinetic. The remaining LV segments had normal wall thickness and normal contractility.  On the post-bypass exam, the LV size and systolic function was unchanged from the pre-bypass  exam.   Right Ventricle: The right ventricle has normal systolic function. The cavity was not dilated. There is no increase in right ventricular wall thickness. On the post-bypass exam, the RV size and systolic function were normal and unchanged from the pre-bypass exam.  Left Atrium: No left atrial/left atrial appendage thrombus was detected. The left atrial diameter was at the upper limits of normal and measured 3.9 cm in the medial-lateral dimension.  Right Atrium: Right atrial size was normal in size.  Interatrial Septum: No atrial level shunt detected by color flow Doppler. There is no evidence of a patent foramen ovale.  Pericardium: There is no evidence of pericardial effusion.  Mitral Valve: The mitral valve is normal in structure. Mitral valve regurgitation is trivial by color flow Doppler. There is No evidence of mitral stenosis. Normal leaflet thickness. No prolapsing or flail leaflet segnments.  Tricuspid Valve: The tricuspid valve was normal in structure. Tricuspid valve regurgitation is mild by color flow  Doppler. No evidence of tricuspid stenosis is present.  Aortic Valve: The aortic valve is tricuspid Aortic valve regurgitation was not visualized by color flow Doppler. There is no stenosis of the aortic valve. Normal leaflet thickness.  Pulmonic Valve: The pulmonic valve was normal in structure, with normal. No evidence of pumonic stenosis. Pulmonic valve regurgitation is trivial by color flow Doppler.   Aorta: The is normal in size and structure. The aortic root and proximal ascending aorta were normal in diameter with a well-aoetic root defined and sino-tubular junction without effacement. There was mild intimal thickening present but no protruding atheromatous plaques or calcification noted.   Shunts: There is no evidence of an atrial septal defect.  +--------------+--------++ LEFT VENTRICLE         +--------------+--------++ PLAX 2D                 +--------------+--------++ LVIDd:        50.00 cm +--------------+--------++                        +--------------+--------++  +---------------+-------++ RIGHT VENTRICLE        +---------------+-------++ RV Basal diam: 3.15 cm +---------------+-------++  +-----------+-------++----------++ LEFT ATRIUM       Index      +-----------+-------++----------++ LA diam:   3.90 cm2.12 cm/m +-----------+-------++----------++  +-------------+-------++ AORTA                +-------------+-------++ Ao Root diam:2.59 cm +-------------+-------++ Ao STJ diam: 2.1 cm  +-------------+-------++ Ao Asc diam: 2.75 cm +-------------+-------++ Ao Desc diam:2.10 cm +-------------+-------++   Alm Laity MD Electronically signed by Alm Laity MD Signature Date/Time: 05/02/2021/10:05:18 PM    Final  MONITORS  LONG TERM MONITOR (3-14 DAYS) 08/27/2021  Narrative Patch Wear Time:  10 days and 2 hours (2022-08-13T16:36:57-0400 to 2022-08-23T19:14:14-0400)  Patient had a min HR of 60 bpm, max HR of 156 bpm, and avg HR of 70 bpm. Predominant underlying rhythm was Sinus Rhythm. First Degree AV Block was present. 3 Supraventricular Tachycardia runs occurred, the run with the fastest interval lasting 12 beats with a max rate of 156 bpm (avg 137 bpm); the run with the fastest interval was also the longest. Some episodes of Supraventricular Tachycardia may be possible Atrial Tachycardia with variable block. Isolated SVEs were rare (<1.0%), SVE Couplets were rare (<1.0%), and SVE Triplets were rare (<1.0%). Isolated VEs were rare (<1.0%), VE Couplets were rare (<1.0%), and no VE Triplets were present.  Sinus rhythm with PACs, brief PAT, rare PVC and rare couplet. Redell Shallow, MD   CT SCANS  CT CARDIAC SCORING (SELF PAY ONLY) 04/23/2021  Addendum 04/23/2021  3:32 PM ADDENDUM REPORT: 04/23/2021 15:29  CLINICAL DATA:  Cardiovascular Disease Risk  stratification  EXAM: Coronary Calcium  Score  TECHNIQUE: A gated, non-contrast computed tomography scan of the heart was performed using 3mm slice thickness. Axial images were analyzed on a dedicated workstation. Calcium  scoring of the coronary arteries was performed using the Agatston method.  FINDINGS: Coronary arteries: Normal origins.  Coronary Calcium  Score:  Left main: 0  Left anterior descending artery: 10.4  Left circumflex artery: 451  Right coronary artery: 624  Total: 1085  Percentile: 98th  Pericardium: Normal.  Ascending Aorta: Normal caliber.  Scattered calcifications.  Non-cardiac: See separate report from [facility].  IMPRESSION: Coronary calcium  score of 1085. This was 98th percentile for age-, race-, and sex-matched controls.  RECOMMENDATIONS: Coronary artery calcium  (CAC) score is a strong predictor of incident coronary heart  disease (CHD) and provides predictive information beyond traditional risk factors. CAC scoring is reasonable to use in the decision to withhold, postpone, or initiate statin therapy in intermediate-risk or selected borderline-risk asymptomatic adults (age 52-75 years and LDL-C >=70 to <190 mg/dL) who do not have diabetes or established atherosclerotic cardiovascular disease (ASCVD).* In intermediate-risk (10-year ASCVD risk >=7.5% to <20%) adults or selected borderline-risk (10-year ASCVD risk >=5% to <7.5%) adults in whom a CAC score is measured for the purpose of making a treatment decision the following recommendations have been made:  If CAC=0, it is reasonable to withhold statin therapy and reassess in 5 to 10 years, as long as higher risk conditions are absent (diabetes mellitus, family history of premature CHD in first degree relatives (males <55 years; females <65 years), cigarette smoking, or LDL >=190 mg/dL).  If CAC is 1 to 99, it is reasonable to initiate statin therapy for patients >=55 years of  age.  If CAC is >=100 or >=75th percentile, it is reasonable to initiate statin therapy at any age.  Cardiology referral should be considered for patients with CAC scores >=400 or >=75th percentile.  *2018 AHA/ACC/AACVPR/AAPA/ABC/ACPM/ADA/AGS/APhA/ASPC/NLA/PCNA Guideline on the Management of Blood Cholesterol: A Report of the American College of Cardiology/American Heart Association Task Force on Clinical Practice Guidelines. J Am Coll Cardiol. 2019;73(24):3168-3209.  Wilbert Bihari, MD   Electronically Signed By: Wilbert Bihari On: 04/23/2021 15:29  Narrative EXAM: OVER-READ INTERPRETATION  CT CHEST  The following report is an over-read performed by radiologist Dr. Toribio Aye of [facility] on 04/23/2021. This over-read does not include interpretation of cardiac or coronary anatomy or pathology. The coronary calcium  score interpretation by the cardiologist is attached.  COMPARISON:  None.  FINDINGS: Aortic atherosclerosis. Mosaic attenuation throughout the lung parenchyma with areas of lucency interspersed with areas of ground-glass attenuation with intervening geographic margins, concerning for widespread air trapping from small airways disease. Within the visualized portions of the thorax there are no suspicious appearing pulmonary nodules or masses, there is no acute consolidative airspace disease, no pleural effusions, no pneumothorax and no lymphadenopathy. Visualized portions of the upper abdomen are unremarkable. There are no aggressive appearing lytic or blastic lesions noted in the visualized portions of the skeleton.  IMPRESSION: 1. 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 2. Aortic atherosclerosis.  Electronically Signed: By: Toribio Aye M.D. On: 04/23/2021 14:56     ______________________________________________________________________________________________      Risk Assessment/Calculations  CHA2DS2-VASc Score = 5    This indicates a 7.2% annual risk of stroke. The patient's score is based upon: CHF History: 1 HTN History: 1 Diabetes History: 0 Stroke History: 0 Vascular Disease History: 1 Age Score: 1 Gender Score: 1            Physical Exam VS:  BP 108/60 (BP Location: Left Arm, Patient Position: Sitting, Cuff Size: Large)   Pulse 97   Ht 5' 4 (1.626 m)   Wt 163 lb 12.8 oz (74.3 kg)   LMP 12/28/2004   SpO2 98%   BMI 28.12 kg/m        Wt Readings from Last 3 Encounters:  01/31/25 163 lb 12.8 oz (74.3 kg)  04/05/24 172 lb (78 kg)  02/18/24 172 lb (78 kg)    GEN: Well nourished, well developed in no acute distress NECK: No JVD; No carotid bruits CARDIAC: RRR, no murmurs, rubs, gallops RESPIRATORY:  Clear to auscultation without rales, wheezing or rhonchi  ABDOMEN: Soft, non-tender, non-distended EXTREMITIES:  No edema;  No deformity   ASSESSMENT AND PLAN  CAD s/p CABG: Denies any recent chest pain.  Continue aspirin  and atorvastatin   Palpitation: Patient has frequent PVCs on today's EKG.  If there is some report of bradycardia, however I am not confident the bradycardia is due to her [employer] watch not being able to pick up the PVCs.  I will order a 3-day heart monitor to assess heart rate and the PVC burden, if no significant bradycardia is seen, I will try to uptitrate metoprolol  succinate  Hypertension: Blood pressure stable  Hyperlipidemia: On atorvastatin   Postop atrial fibrillation: Occurred after CABG, no recurrence of A-fib since.  Not on anticoagulation therapy due to postop nature and lack of recurrence         Dispo: Follow-up in 3 to 4 weeks  Signed, Marieanne Marxen, PA  "

## 2025-01-31 NOTE — Patient Instructions (Addendum)
 Medication Instructions:  No Changes *If you need a refill on your cardiac medications before your next appointment, please call your pharmacy*  Lab Work: Today: BMET, CBC, and TSH  If you have labs (blood work) drawn today and your tests are completely normal, you will receive your results only by: MyChart Message (if you have MyChart) OR A paper copy in the mail If you have any lab test that is abnormal or we need to change your treatment, we will call you to review the results.  Testing/Procedures: Your physician has requested that you have an echocardiogram on 02/01/25 at 8:45 am at [address] (check in on level one). Echocardiography is a painless test that uses sound waves to create images of your heart. It provides your doctor with information about the size and shape of your heart and how well your hearts chambers and valves are working. This procedure takes approximately one hour. There are no restrictions for this procedure. Please do NOT wear cologne, perfume, aftershave, or lotions (deodorant is allowed). Please arrive 15 minutes prior to your appointment time.  Please note: We ask at that you not bring children with you during ultrasound (echo/ vascular) testing. Due to room size and safety concerns, children are not allowed in the ultrasound rooms during exams. Our front office staff cannot provide observation of children in our lobby area while testing is being conducted. An adult accompanying a patient to their appointment will only be allowed in the ultrasound room at the discretion of the ultrasound technician under special circumstances. We apologize for any inconvenience.   *-*-*-*-*-*-*-*-*-*-*-*-*-*-*-*-*-*- [facility]- Long Term Monitor Instructions  Your physician has requested you wear a ZIO patch monitor for 3 days.  This is a single patch monitor. [employer] supplies one patch monitor per enrollment. Additional stickers are not available. Please do not apply patch  if you will be having a Nuclear Stress Test,  Echocardiogram, Cardiac CT, MRI, or Chest Xray during the period you would be wearing the  monitor. The patch cannot be worn during these tests. You cannot remove and re-apply the  ZIO XT patch monitor.  Your ZIO patch monitor will be mailed 3 day USPS to your address on file. It may take 3-5 days  to receive your monitor after you have been enrolled.  Once you have received your monitor, please review the enclosed instructions. Your monitor  has already been registered assigning a specific monitor serial # to you.  Billing and Patient Assistance Program Information  We have supplied [employer] with any of your insurance information on file for billing purposes. [employer] offers a sliding scale Patient Assistance Program for patients that do not have  insurance, or whose insurance does not completely cover the cost of the ZIO monitor.  You must apply for the Patient Assistance Program to qualify for this discounted rate.  To apply, please call [employer] at [phone], select option 4, select option 2, ask to apply for  Patient Assistance Program. [place] will ask your household income, and how many people  are in your household. They will quote your out-of-pocket cost based on that information.  [employer] will also be able to set up a 7-month, interest-free payment plan if needed.  Applying the monitor   Shave hair from upper left chest.  Hold abrader disc by orange tab. Rub abrader in 40 strokes over the upper left chest as  indicated in your monitor instructions.  Clean area with 4 enclosed alcohol  pads. Let dry.  Apply patch as indicated in monitor instructions. Patch will be placed under collarbone on left  side of chest with arrow pointing upward.  Rub patch adhesive wings for 2 minutes. Remove white label marked 1. Remove the white  label marked 2. Rub patch adhesive wings for 2 additional minutes.  While looking in a mirror, press and  release button in center of patch. A small green light will  flash 3-4 times. This will be your only indicator that the monitor has been turned on.  Do not shower for the first 24 hours. You may shower after the first 24 hours.  Press the button if you feel a symptom. You will hear a small click. Record Date, Time and  Symptom in the Patient Logbook.  When you are ready to remove the patch, follow instructions on the last 2 pages of Patient  Logbook. Stick patch monitor onto the last page of Patient Logbook.  Place Patient Logbook in the blue and white box. Use locking tab on box and tape box closed  securely. The blue and white box has prepaid postage on it. Please place it in the mailbox as  soon as possible. Your physician should have your test results approximately 7 days after the  monitor has been mailed back to [facility].  Call [facility] Customer Care at [phone] if you have questions regarding  your ZIO XT patch monitor. Call them immediately if you see an orange light blinking on your  monitor.  If your monitor falls off in less than 4 days, contact our Monitor department at [phone].  If your monitor becomes loose or falls off after 4 days call [employer] at [phone] for  suggestions on securing your monitor  Follow-Up: At [facility], you and your health needs are our priority.  As part of our continuing mission to provide you with exceptional heart care, our providers are all part of one team.  This team includes your primary Cardiologist (physician) and Advanced Practice Providers or APPs (Physician Assistants and Nurse Practitioners) who all work together to provide you with the care you need, when you need it.  Your next appointment:    03/01/2025 at 1:55 pm  Provider:   Hao Meng, PA

## 2025-01-31 NOTE — Progress Notes (Unsigned)
Enrolled patient for a 3 day Zio XT monitor to be mailed to patients home  ? ?[place] to read ?

## 2025-02-01 ENCOUNTER — Ambulatory Visit (HOSPITAL_COMMUNITY)
Admission: RE | Admit: 2025-02-01 | Discharge: 2025-02-01 | Attending: Cardiovascular Disease | Admitting: Cardiovascular Disease

## 2025-02-01 DIAGNOSIS — I2510 Atherosclerotic heart disease of native coronary artery without angina pectoris: Secondary | ICD-10-CM

## 2025-02-01 DIAGNOSIS — Z951 Presence of aortocoronary bypass graft: Secondary | ICD-10-CM

## 2025-02-01 DIAGNOSIS — R002 Palpitations: Secondary | ICD-10-CM

## 2025-02-01 LAB — CBC
Hematocrit: 41.9 (ref 34.0–46.6)
Hemoglobin: 13.5 g/dL (ref 11.1–15.9)
MCH: 26.5 pg — AB (ref 26.6–33.0)
MCHC: 32.2 g/dL (ref 31.5–35.7)
MCV: 82 fL (ref 79–97)
Platelets: 425 10*3/uL (ref 150–450)
RBC: 5.09 x10E6/uL (ref 3.77–5.28)
RDW: 14.5 (ref 11.7–15.4)
WBC: 12.9 10*3/uL — AB (ref 3.4–10.8)

## 2025-02-01 LAB — BASIC METABOLIC PANEL WITH GFR
BUN/Creatinine Ratio: 16 (ref 12–28)
BUN: 18 mg/dL (ref 8–27)
CO2: 17 mmol/L — ABNORMAL LOW (ref 20–29)
Calcium: 10.8 mg/dL — ABNORMAL HIGH (ref 8.7–10.3)
Chloride: 98 mmol/L (ref 96–106)
Creatinine, Ser: 1.14 mg/dL — ABNORMAL HIGH (ref 0.57–1.00)
Glucose: 164 mg/dL — ABNORMAL HIGH (ref 70–99)
Potassium: 4.5 mmol/L (ref 3.5–5.2)
Sodium: 141 mmol/L (ref 134–144)
eGFR: 51 mL/min/{1.73_m2} — ABNORMAL LOW

## 2025-02-01 LAB — ECHOCARDIOGRAM COMPLETE
Area-P 1/2: 5.23 cm2
Calc EF: 45.9 %
S' Lateral: 3 cm
Single Plane A2C EF: 47.3 %
Single Plane A4C EF: 44.1 %

## 2025-02-01 LAB — TSH: TSH: 2.49 u[IU]/mL (ref 0.450–4.500)

## 2025-02-01 MED ORDER — PERFLUTREN LIPID MICROSPHERE
1.0000 mL | INTRAVENOUS | Status: AC | PRN
  Administered 2025-02-01: 2 mL via INTRAVENOUS

## 2025-03-01 ENCOUNTER — Ambulatory Visit: Admitting: Physician Assistant

## 2025-05-25 ENCOUNTER — Ambulatory Visit: Admitting: Cardiology
